# Patient Record
Sex: Male | Born: 1965 | Race: White | Hispanic: No | Marital: Single | Smoking: Current every day smoker
Health system: Southern US, Community
[De-identification: ages and names within clinical notes are randomized; demographics above are authoritative.]

## PROBLEM LIST (undated history)

## (undated) DIAGNOSIS — F329 Major depressive disorder, single episode, unspecified: Secondary | ICD-10-CM

## (undated) DIAGNOSIS — D649 Anemia, unspecified: Secondary | ICD-10-CM

## (undated) DIAGNOSIS — R0600 Dyspnea, unspecified: Secondary | ICD-10-CM

## (undated) DIAGNOSIS — K219 Gastro-esophageal reflux disease without esophagitis: Secondary | ICD-10-CM

## (undated) DIAGNOSIS — M1990 Unspecified osteoarthritis, unspecified site: Secondary | ICD-10-CM

## (undated) DIAGNOSIS — R002 Palpitations: Secondary | ICD-10-CM

## (undated) DIAGNOSIS — R0902 Hypoxemia: Secondary | ICD-10-CM

## (undated) DIAGNOSIS — F32A Depression, unspecified: Secondary | ICD-10-CM

## (undated) DIAGNOSIS — F419 Anxiety disorder, unspecified: Secondary | ICD-10-CM

## (undated) DIAGNOSIS — R0601 Orthopnea: Secondary | ICD-10-CM

## (undated) DIAGNOSIS — I639 Cerebral infarction, unspecified: Secondary | ICD-10-CM

## (undated) DIAGNOSIS — Z8669 Personal history of other diseases of the nervous system and sense organs: Secondary | ICD-10-CM

## (undated) DIAGNOSIS — I209 Angina pectoris, unspecified: Secondary | ICD-10-CM

## (undated) DIAGNOSIS — R609 Edema, unspecified: Secondary | ICD-10-CM

## (undated) DIAGNOSIS — J449 Chronic obstructive pulmonary disease, unspecified: Secondary | ICD-10-CM

## (undated) DIAGNOSIS — R011 Cardiac murmur, unspecified: Secondary | ICD-10-CM

## (undated) DIAGNOSIS — I10 Essential (primary) hypertension: Secondary | ICD-10-CM

## (undated) DIAGNOSIS — I499 Cardiac arrhythmia, unspecified: Secondary | ICD-10-CM

## (undated) DIAGNOSIS — G8929 Other chronic pain: Secondary | ICD-10-CM

## (undated) DIAGNOSIS — J45909 Unspecified asthma, uncomplicated: Secondary | ICD-10-CM

## (undated) DIAGNOSIS — R569 Unspecified convulsions: Secondary | ICD-10-CM

## (undated) DIAGNOSIS — R42 Dizziness and giddiness: Secondary | ICD-10-CM

## (undated) DIAGNOSIS — F1910 Other psychoactive substance abuse, uncomplicated: Secondary | ICD-10-CM

## (undated) DIAGNOSIS — R062 Wheezing: Secondary | ICD-10-CM

## (undated) DIAGNOSIS — K222 Esophageal obstruction: Secondary | ICD-10-CM

## (undated) DIAGNOSIS — F319 Bipolar disorder, unspecified: Secondary | ICD-10-CM

## (undated) DIAGNOSIS — I219 Acute myocardial infarction, unspecified: Secondary | ICD-10-CM

## (undated) DIAGNOSIS — I2510 Atherosclerotic heart disease of native coronary artery without angina pectoris: Secondary | ICD-10-CM

## (undated) HISTORY — PX: PEG TUBE REMOVAL: SHX2187

## (undated) HISTORY — PX: TRACHEOSTOMY CLOSURE: SHX458

## (undated) HISTORY — PX: PEG W/TRACHEOSTOMY PLACEMENT: SHX2188

## (undated) HISTORY — PX: ESOPHAGOGASTRODUODENOSCOPY (EGD) WITH ESOPHAGEAL DILATION: SHX5812

## (undated) HISTORY — PX: APPENDECTOMY: SHX54

---

## 2010-01-03 ENCOUNTER — Ambulatory Visit: Payer: Self-pay

## 2013-05-18 ENCOUNTER — Ambulatory Visit: Payer: Self-pay

## 2013-06-03 ENCOUNTER — Inpatient Hospital Stay: Payer: Self-pay | Admitting: Surgery

## 2013-06-03 LAB — COMPREHENSIVE METABOLIC PANEL
ALT: 24 U/L (ref 12–78)
ANION GAP: 4 — AB (ref 7–16)
Albumin: 2.5 g/dL — ABNORMAL LOW (ref 3.4–5.0)
Alkaline Phosphatase: 59 U/L
BUN: 22 mg/dL — AB (ref 7–18)
Bilirubin,Total: 0.7 mg/dL (ref 0.2–1.0)
CALCIUM: 7.9 mg/dL — AB (ref 8.5–10.1)
CHLORIDE: 92 mmol/L — AB (ref 98–107)
CREATININE: 0.83 mg/dL (ref 0.60–1.30)
Co2: 32 mmol/L (ref 21–32)
EGFR (Non-African Amer.): >60
GLUCOSE: 108 mg/dL — AB (ref 65–99)
Osmolality: 261 (ref 275–301)
POTASSIUM: 3.5 mmol/L (ref 3.5–5.1)
SGOT(AST): 33 U/L (ref 15–37)
SODIUM: 128 mmol/L — AB (ref 136–145)
Total Protein: 7 g/dL (ref 6.4–8.2)

## 2013-06-03 LAB — URINALYSIS, COMPLETE
BLOOD: NEGATIVE
Bacteria: NONE SEEN
Bilirubin,UR: NEGATIVE
GLUCOSE, UR: NEGATIVE mg/dL (ref 0–75)
KETONE: NEGATIVE
LEUKOCYTE ESTERASE: NEGATIVE
NITRITE: NEGATIVE
PROTEIN: NEGATIVE
Ph: 6 (ref 4.5–8.0)
RBC,UR: NONE SEEN /HPF (ref 0–5)
SPECIFIC GRAVITY: 1.017 (ref 1.003–1.030)
Squamous Epithelial: NONE SEEN

## 2013-06-03 LAB — CBC WITH DIFFERENTIAL/PLATELET
BASOS PCT: 0.5 %
Basophil #: 0.1 10*3/uL (ref 0.0–0.1)
Eosinophil #: 0.1 10*3/uL (ref 0.0–0.7)
Eosinophil %: 1 %
HCT: 45.6 % (ref 40.0–52.0)
HGB: 15.4 g/dL (ref 13.0–18.0)
LYMPHS ABS: 1.3 10*3/uL (ref 1.0–3.6)
Lymphocyte %: 10.2 %
MCH: 30.9 pg (ref 26.0–34.0)
MCHC: 33.8 g/dL (ref 32.0–36.0)
MCV: 91 fL (ref 80–100)
MONO ABS: 3.1 x10 3/mm — AB (ref 0.2–1.0)
Monocyte %: 24.6 %
NEUTROS ABS: 8 10*3/uL — AB (ref 1.4–6.5)
NEUTROS PCT: 63.7 %
Platelet: 188 10*3/uL (ref 150–440)
RBC: 4.99 10*6/uL (ref 4.40–5.90)
RDW: 14.9 % — AB (ref 11.5–14.5)
WBC: 12.6 10*3/uL — AB (ref 3.8–10.6)

## 2013-06-03 LAB — PROTIME-INR
INR: 1
Prothrombin Time: 13.1 secs (ref 11.5–14.7)

## 2013-06-03 LAB — PRO B NATRIURETIC PEPTIDE: B-Type Natriuretic Peptide: 25 pg/mL (ref 0–125)

## 2013-06-03 LAB — LIPASE, BLOOD: LIPASE: 286 U/L (ref 73–393)

## 2013-06-04 LAB — BASIC METABOLIC PANEL
ANION GAP: 4 — AB (ref 7–16)
BUN: 15 mg/dL (ref 7–18)
CHLORIDE: 101 mmol/L (ref 98–107)
CO2: 27 mmol/L (ref 21–32)
Calcium, Total: 6.2 mg/dL — CL (ref 8.5–10.1)
Creatinine: 0.85 mg/dL (ref 0.60–1.30)
EGFR (Non-African Amer.): >60
Glucose: 190 mg/dL — ABNORMAL HIGH (ref 65–99)
Osmolality: 270 (ref 275–301)
Potassium: 4 mmol/L (ref 3.5–5.1)
Sodium: 132 mmol/L — ABNORMAL LOW (ref 136–145)

## 2013-06-04 LAB — CBC WITH DIFFERENTIAL/PLATELET
BASOS PCT: 0.2 %
Basophil #: 0 10*3/uL (ref 0.0–0.1)
EOS ABS: 0 10*3/uL (ref 0.0–0.7)
EOS PCT: 0.3 %
HCT: 42.2 % (ref 40.0–52.0)
HGB: 14.2 g/dL (ref 13.0–18.0)
LYMPHS ABS: 0.7 10*3/uL — AB (ref 1.0–3.6)
LYMPHS PCT: 5.1 %
MCH: 31.1 pg (ref 26.0–34.0)
MCHC: 33.7 g/dL (ref 32.0–36.0)
MCV: 92 fL (ref 80–100)
MONO ABS: 1.5 x10 3/mm — AB (ref 0.2–1.0)
Monocyte %: 11.2 %
NEUTROS ABS: 11.5 10*3/uL — AB (ref 1.4–6.5)
NEUTROS PCT: 83.2 %
Platelet: 171 10*3/uL (ref 150–440)
RBC: 4.57 10*6/uL (ref 4.40–5.90)
RDW: 15.3 % — AB (ref 11.5–14.5)
WBC: 13.8 10*3/uL — AB (ref 3.8–10.6)

## 2013-06-05 LAB — BASIC METABOLIC PANEL
Anion Gap: 2 — ABNORMAL LOW (ref 7–16)
Anion Gap: 1 — ABNORMAL LOW (ref 7–16)
BUN: 10 mg/dL (ref 7–18)
BUN: 16 mg/dL (ref 7–18)
CO2: 29 mmol/L (ref 21–32)
CREATININE: 0.78 mg/dL (ref 0.60–1.30)
Calcium, Total: 6.7 mg/dL — CL (ref 8.5–10.1)
Calcium, Total: 7 mg/dL — CL (ref 8.5–10.1)
Chloride: 105 mmol/L (ref 98–107)
Chloride: 107 mmol/L (ref 98–107)
Co2: 31 mmol/L (ref 21–32)
Creatinine: 0.81 mg/dL (ref 0.60–1.30)
EGFR (African American): >60
EGFR (Non-African Amer.): >60
GLUCOSE: 122 mg/dL — AB (ref 65–99)
GLUCOSE: 123 mg/dL — AB (ref 65–99)
Osmolality: 272 (ref 275–301)
Osmolality: 280 (ref 275–301)
POTASSIUM: 3.8 mmol/L (ref 3.5–5.1)
Potassium: 4.3 mmol/L (ref 3.5–5.1)
SODIUM: 136 mmol/L (ref 136–145)
Sodium: 139 mmol/L (ref 136–145)

## 2013-06-05 LAB — CBC WITH DIFFERENTIAL/PLATELET
BANDS NEUTROPHIL: 4 %
Bands: 8 %
Basophil: 2 %
Eosinophil: 1 %
HCT: 38.5 % — AB (ref 40.0–52.0)
HCT: 32.8 % — AB (ref 40.0–52.0)
HGB: 12.6 g/dL — ABNORMAL LOW (ref 13.0–18.0)
HGB: 10.5 g/dL — AB (ref 13.0–18.0)
Lymphocytes: 15 %
Lymphocytes: 16 %
MCH: 30.3 pg (ref 26.0–34.0)
MCH: 29.8 pg (ref 26.0–34.0)
MCHC: 32.8 g/dL (ref 32.0–36.0)
MCHC: 31.9 g/dL — ABNORMAL LOW (ref 32.0–36.0)
MCV: 93 fL (ref 80–100)
MCV: 93 fL (ref 80–100)
MONOS PCT: 14 %
MONOS PCT: 8 %
MYELOCYTE: 1 %
Metamyelocyte: 1 %
Metamyelocyte: 2 %
NRBC/100 WBC: 1 /
PLATELETS: 267 10*3/uL (ref 150–440)
Platelet: 231 10*3/uL (ref 150–440)
RBC: 4.17 10*6/uL — AB (ref 4.40–5.90)
RBC: 3.51 10*6/uL — ABNORMAL LOW (ref 4.40–5.90)
RDW: 15.2 % — ABNORMAL HIGH (ref 11.5–14.5)
RDW: 14.9 % — AB (ref 11.5–14.5)
SEGMENTED NEUTROPHILS: 68 %
Segmented Neutrophils: 60 %
WBC: 18.3 10*3/uL — AB (ref 3.8–10.6)
WBC: 25.9 10*3/uL — AB (ref 3.8–10.6)

## 2013-06-06 LAB — BASIC METABOLIC PANEL
BUN: 20 mg/dL — AB (ref 7–18)
CALCIUM: 6.8 mg/dL — AB (ref 8.5–10.1)
CREATININE: 0.8 mg/dL (ref 0.60–1.30)
Chloride: 108 mmol/L — ABNORMAL HIGH (ref 98–107)
Co2: 36 mmol/L — ABNORMAL HIGH (ref 21–32)
Glucose: 97 mg/dL (ref 65–99)
Osmolality: 288 (ref 275–301)
Potassium: 4 mmol/L (ref 3.5–5.1)
Sodium: 143 mmol/L (ref 136–145)

## 2013-06-06 LAB — CBC WITH DIFFERENTIAL/PLATELET
BANDS NEUTROPHIL: 4 %
Eosinophil: 1 %
HCT: 28.5 % — AB (ref 40.0–52.0)
HGB: 9.3 g/dL — ABNORMAL LOW (ref 13.0–18.0)
Lymphocytes: 9 %
MCH: 30.3 pg (ref 26.0–34.0)
MCHC: 32.5 g/dL (ref 32.0–36.0)
MCV: 93 fL (ref 80–100)
METAMYELOCYTE: 3 %
Monocytes: 7 %
NRBC/100 WBC: 1 /
PLATELETS: 212 10*3/uL (ref 150–440)
RBC: 3.07 10*6/uL — AB (ref 4.40–5.90)
RDW: 15.1 % — AB (ref 11.5–14.5)
Segmented Neutrophils: 76 %
WBC: 16.7 10*3/uL — AB (ref 3.8–10.6)

## 2013-06-06 LAB — PHOSPHORUS: Phosphorus: 1.7 mg/dL — ABNORMAL LOW (ref 2.5–4.9)

## 2013-06-06 LAB — MAGNESIUM: Magnesium: 2.4 mg/dL

## 2013-06-07 LAB — BASIC METABOLIC PANEL
Anion Gap: 1 — ABNORMAL LOW (ref 7–16)
BUN: 16 mg/dL (ref 7–18)
CALCIUM: 6.8 mg/dL — AB (ref 8.5–10.1)
Chloride: 106 mmol/L (ref 98–107)
Co2: 36 mmol/L — ABNORMAL HIGH (ref 21–32)
Creatinine: 0.9 mg/dL (ref 0.60–1.30)
Glucose: 110 mg/dL — ABNORMAL HIGH (ref 65–99)
Osmolality: 287 (ref 275–301)
Potassium: 3.7 mmol/L (ref 3.5–5.1)
SODIUM: 143 mmol/L (ref 136–145)

## 2013-06-07 LAB — MAGNESIUM: Magnesium: 2.4 mg/dL

## 2013-06-07 LAB — CBC WITH DIFFERENTIAL/PLATELET
BASOS ABS: 0.1 10*3/uL (ref 0.0–0.1)
BASOS PCT: 0.4 %
Eosinophil #: 0.2 10*3/uL (ref 0.0–0.7)
Eosinophil %: 1.3 %
HCT: 26 % — AB (ref 40.0–52.0)
HGB: 8.7 g/dL — AB (ref 13.0–18.0)
Lymphocyte #: 1.1 10*3/uL (ref 1.0–3.6)
Lymphocyte %: 8.3 %
MCH: 31.4 pg (ref 26.0–34.0)
MCHC: 33.5 g/dL (ref 32.0–36.0)
MCV: 94 fL (ref 80–100)
Monocyte #: 1.1 x10 3/mm — ABNORMAL HIGH (ref 0.2–1.0)
Monocyte %: 8.2 %
NEUTROS PCT: 81.8 %
Neutrophil #: 10.9 10*3/uL — ABNORMAL HIGH (ref 1.4–6.5)
Platelet: 212 10*3/uL (ref 150–440)
RBC: 2.78 10*6/uL — AB (ref 4.40–5.90)
RDW: 15.3 % — ABNORMAL HIGH (ref 11.5–14.5)
WBC: 13.3 10*3/uL — ABNORMAL HIGH (ref 3.8–10.6)

## 2013-06-08 LAB — ALBUMIN: Albumin: 1.6 g/dL — ABNORMAL LOW (ref 3.4–5.0)

## 2013-06-08 LAB — CALCIUM: CALCIUM: 7 mg/dL — AB (ref 8.5–10.1)

## 2013-06-08 LAB — PATHOLOGY REPORT

## 2013-06-08 LAB — PHOSPHORUS: Phosphorus: 2.9 mg/dL (ref 2.5–4.9)

## 2013-06-09 LAB — BASIC METABOLIC PANEL
ANION GAP: 5 — AB (ref 7–16)
BUN: 12 mg/dL (ref 7–18)
CALCIUM: 6.8 mg/dL — AB (ref 8.5–10.1)
CHLORIDE: 106 mmol/L (ref 98–107)
CO2: 32 mmol/L (ref 21–32)
CREATININE: 0.95 mg/dL (ref 0.60–1.30)
EGFR (African American): >60
GLUCOSE: 101 mg/dL — AB (ref 65–99)
Osmolality: 285 (ref 275–301)
POTASSIUM: 3.6 mmol/L (ref 3.5–5.1)
Sodium: 143 mmol/L (ref 136–145)

## 2013-06-09 LAB — CBC WITH DIFFERENTIAL/PLATELET
Basophil #: 0.1 10*3/uL (ref 0.0–0.1)
Basophil %: 0.7 %
EOS PCT: 1.9 %
Eosinophil #: 0.2 10*3/uL (ref 0.0–0.7)
HCT: 24.7 % — ABNORMAL LOW (ref 40.0–52.0)
HGB: 8.2 g/dL — ABNORMAL LOW (ref 13.0–18.0)
LYMPHS PCT: 7.6 %
Lymphocyte #: 1 10*3/uL (ref 1.0–3.6)
MCH: 31.1 pg (ref 26.0–34.0)
MCHC: 33.2 g/dL (ref 32.0–36.0)
MCV: 94 fL (ref 80–100)
MONO ABS: 1 x10 3/mm (ref 0.2–1.0)
Monocyte %: 7.4 %
Neutrophil #: 10.7 10*3/uL — ABNORMAL HIGH (ref 1.4–6.5)
Neutrophil %: 82.4 %
Platelet: 266 10*3/uL (ref 150–440)
RBC: 2.64 10*6/uL — AB (ref 4.40–5.90)
RDW: 14.9 % — AB (ref 11.5–14.5)
WBC: 13 10*3/uL — ABNORMAL HIGH (ref 3.8–10.6)

## 2013-06-09 LAB — TRIGLYCERIDES: Triglycerides: 532 mg/dL — ABNORMAL HIGH (ref 0–200)

## 2013-06-09 LAB — PROTIME-INR
INR: 1.1
Prothrombin Time: 14 secs (ref 11.5–14.7)

## 2013-06-11 LAB — CBC WITH DIFFERENTIAL/PLATELET
BASOS ABS: 0.1 10*3/uL (ref 0.0–0.1)
BASOS PCT: 0.7 %
EOS PCT: 1.1 %
Eosinophil #: 0.1 10*3/uL (ref 0.0–0.7)
HCT: 22.7 % — ABNORMAL LOW (ref 40.0–52.0)
HGB: 7.3 g/dL — ABNORMAL LOW (ref 13.0–18.0)
LYMPHS PCT: 12.3 %
Lymphocyte #: 1.2 10*3/uL (ref 1.0–3.6)
MCH: 30.6 pg (ref 26.0–34.0)
MCHC: 32.2 g/dL (ref 32.0–36.0)
MCV: 95 fL (ref 80–100)
MONO ABS: 0.7 x10 3/mm (ref 0.2–1.0)
Monocyte %: 7.7 %
Neutrophil #: 7.5 10*3/uL — ABNORMAL HIGH (ref 1.4–6.5)
Neutrophil %: 78.2 %
Platelet: 284 10*3/uL (ref 150–440)
RBC: 2.38 10*6/uL — AB (ref 4.40–5.90)
RDW: 15.7 % — ABNORMAL HIGH (ref 11.5–14.5)
WBC: 9.6 10*3/uL (ref 3.8–10.6)

## 2013-06-11 LAB — BASIC METABOLIC PANEL
Anion Gap: 1 — ABNORMAL LOW (ref 7–16)
BUN: 12 mg/dL (ref 7–18)
CHLORIDE: 110 mmol/L — AB (ref 98–107)
CO2: 33 mmol/L — AB (ref 21–32)
Calcium, Total: 7.2 mg/dL — ABNORMAL LOW (ref 8.5–10.1)
Creatinine: 0.93 mg/dL (ref 0.60–1.30)
EGFR (Non-African Amer.): >60
GLUCOSE: 88 mg/dL (ref 65–99)
Osmolality: 286 (ref 275–301)
POTASSIUM: 4 mmol/L (ref 3.5–5.1)
Sodium: 144 mmol/L (ref 136–145)

## 2013-06-11 LAB — MAGNESIUM: MAGNESIUM: 2 mg/dL

## 2013-06-11 LAB — PHOSPHORUS: Phosphorus: 2.5 mg/dL (ref 2.5–4.9)

## 2013-06-12 LAB — TPN PANEL
ALK PHOS: 52 U/L
AST: 24 U/L (ref 15–37)
Activated PTT: 31.6 secs (ref 23.6–35.9)
Albumin: 1.4 g/dL — ABNORMAL LOW (ref 3.4–5.0)
BUN: 12 mg/dL (ref 7–18)
CALCIUM: 7.3 mg/dL — AB (ref 8.5–10.1)
CHOLESTEROL: 82 mg/dL (ref 0–200)
CO2: 36 mmol/L — AB (ref 21–32)
Chloride: 110 mmol/L — ABNORMAL HIGH (ref 98–107)
Creatinine: 0.8 mg/dL (ref 0.60–1.30)
EGFR (African American): >60
GLUCOSE: 158 mg/dL — AB (ref 65–99)
HGB: 7.3 g/dL — ABNORMAL LOW (ref 13.0–18.0)
INR: 1.1
MAGNESIUM: 2 mg/dL
OSMOLALITY: 292 (ref 275–301)
PLATELETS: 272 10*3/uL (ref 150–440)
POTASSIUM: 3.8 mmol/L (ref 3.5–5.1)
Phosphorus: 2.4 mg/dL — ABNORMAL LOW (ref 2.5–4.9)
Prothrombin Time: 14.2 secs (ref 11.5–14.7)
Sodium: 145 mmol/L (ref 136–145)
Total Protein: 5.5 g/dL — ABNORMAL LOW (ref 6.4–8.2)
Triglycerides: 218 mg/dL — ABNORMAL HIGH (ref 0–200)
WBC: 8.4 10*3/uL (ref 3.8–10.6)

## 2013-06-13 LAB — CBC WITH DIFFERENTIAL/PLATELET
BASOS PCT: 0.6 %
Basophil #: 0 10*3/uL (ref 0.0–0.1)
Eosinophil #: 0.1 10*3/uL (ref 0.0–0.7)
Eosinophil %: 1.5 %
HCT: 23.4 % — ABNORMAL LOW (ref 40.0–52.0)
HGB: 7.5 g/dL — AB (ref 13.0–18.0)
LYMPHS PCT: 11.4 %
Lymphocyte #: 0.9 10*3/uL — ABNORMAL LOW (ref 1.0–3.6)
MCH: 30.5 pg (ref 26.0–34.0)
MCHC: 32 g/dL (ref 32.0–36.0)
MCV: 95 fL (ref 80–100)
Monocyte #: 0.5 x10 3/mm (ref 0.2–1.0)
Monocyte %: 6.9 %
Neutrophil #: 6 10*3/uL (ref 1.4–6.5)
Neutrophil %: 79.6 %
Platelet: 303 10*3/uL (ref 150–440)
RBC: 2.46 10*6/uL — ABNORMAL LOW (ref 4.40–5.90)
RDW: 15.7 % — ABNORMAL HIGH (ref 11.5–14.5)
WBC: 7.5 10*3/uL (ref 3.8–10.6)

## 2013-06-13 LAB — PHOSPHORUS: Phosphorus: 3.1 mg/dL (ref 2.5–4.9)

## 2013-06-13 LAB — BASIC METABOLIC PANEL
Anion Gap: 0 — ABNORMAL LOW (ref 7–16)
BUN: 11 mg/dL (ref 7–18)
Chloride: 107 mmol/L (ref 98–107)
Co2: 38 mmol/L — ABNORMAL HIGH (ref 21–32)
Creatinine: 0.85 mg/dL (ref 0.60–1.30)
Glucose: 149 mg/dL — ABNORMAL HIGH (ref 65–99)
Osmolality: 291 (ref 275–301)

## 2013-06-13 LAB — POTASSIUM
POTASSIUM: 3.7 mmol/L (ref 3.5–5.1)
Potassium: 3.4 mmol/L — ABNORMAL LOW (ref 3.5–5.1)

## 2013-06-13 LAB — SODIUM: Sodium: 145 mmol/L (ref 136–145)

## 2013-06-13 LAB — URINE CULTURE

## 2013-06-13 LAB — MAGNESIUM: Magnesium: 1.9 mg/dL

## 2013-06-13 LAB — CALCIUM: Calcium, Total: 7.6 mg/dL — ABNORMAL LOW (ref 8.5–10.1)

## 2013-06-14 LAB — BASIC METABOLIC PANEL
BUN: 11 mg/dL (ref 7–18)
CALCIUM: 7.6 mg/dL — AB (ref 8.5–10.1)
CHLORIDE: 108 mmol/L — AB (ref 98–107)
Co2: 39 mmol/L — ABNORMAL HIGH (ref 21–32)
Creatinine: 0.8 mg/dL (ref 0.60–1.30)
GLUCOSE: 157 mg/dL — AB (ref 65–99)
Osmolality: 291 (ref 275–301)
POTASSIUM: 3.5 mmol/L (ref 3.5–5.1)
Sodium: 145 mmol/L (ref 136–145)

## 2013-06-14 LAB — PHOSPHORUS: Phosphorus: 3.3 mg/dL (ref 2.5–4.9)

## 2013-06-14 LAB — MAGNESIUM: Magnesium: 1.9 mg/dL

## 2013-06-15 LAB — PHOSPHORUS
PHOSPHORUS: 1.8 mg/dL — AB (ref 2.5–4.9)
Phosphorus: 2 mg/dL — ABNORMAL LOW (ref 2.5–4.9)

## 2013-06-15 LAB — SODIUM: Sodium: 144 mmol/L (ref 136–145)

## 2013-06-15 LAB — BASIC METABOLIC PANEL
BUN: 17 mg/dL (ref 7–18)
CHLORIDE: 108 mmol/L — AB (ref 98–107)
CREATININE: 0.77 mg/dL (ref 0.60–1.30)
Calcium, Total: 7.8 mg/dL — ABNORMAL LOW (ref 8.5–10.1)
Co2: 37 mmol/L — ABNORMAL HIGH (ref 21–32)
EGFR (Non-African Amer.): >60
GLUCOSE: 209 mg/dL — AB (ref 65–99)
OSMOLALITY: 295 (ref 275–301)
POTASSIUM: 4.1 mmol/L (ref 3.5–5.1)
Sodium: 144 mmol/L (ref 136–145)

## 2013-06-15 LAB — POTASSIUM: Potassium: 3.6 mmol/L (ref 3.5–5.1)

## 2013-06-15 LAB — EXPECTORATED SPUTUM ASSESSMENT W GRAM STAIN, RFLX TO RESP C

## 2013-06-15 LAB — MAGNESIUM: MAGNESIUM: 1.9 mg/dL

## 2013-06-16 LAB — BASIC METABOLIC PANEL
ANION GAP: 3 — AB (ref 7–16)
BUN: 18 mg/dL (ref 7–18)
CHLORIDE: 105 mmol/L (ref 98–107)
Calcium, Total: 7.7 mg/dL — ABNORMAL LOW (ref 8.5–10.1)
Co2: 35 mmol/L — ABNORMAL HIGH (ref 21–32)
Creatinine: 0.81 mg/dL (ref 0.60–1.30)
EGFR (Non-African Amer.): >60
Glucose: 203 mg/dL — ABNORMAL HIGH (ref 65–99)
Osmolality: 293 (ref 275–301)
POTASSIUM: 3.7 mmol/L (ref 3.5–5.1)
Sodium: 143 mmol/L (ref 136–145)

## 2013-06-16 LAB — PHOSPHORUS: PHOSPHORUS: 3.5 mg/dL (ref 2.5–4.9)

## 2013-06-16 LAB — MAGNESIUM: Magnesium: 2 mg/dL

## 2013-06-17 LAB — BASIC METABOLIC PANEL
Anion Gap: 8 (ref 7–16)
Anion Gap: 4 — ABNORMAL LOW (ref 7–16)
BUN: 13 mg/dL (ref 7–18)
BUN: 15 mg/dL (ref 7–18)
CALCIUM: 7.4 mg/dL — AB (ref 8.5–10.1)
CHLORIDE: 108 mmol/L — AB (ref 98–107)
CO2: 34 mmol/L — AB (ref 21–32)
CREATININE: 0.72 mg/dL (ref 0.60–1.30)
Calcium, Total: 8 mg/dL — ABNORMAL LOW (ref 8.5–10.1)
Chloride: 101 mmol/L (ref 98–107)
Co2: 27 mmol/L (ref 21–32)
Creatinine: 1.06 mg/dL (ref 0.60–1.30)
EGFR (African American): >60
EGFR (Non-African Amer.): >60
EGFR (Non-African Amer.): >60
GLUCOSE: 1719 mg/dL — AB (ref 65–99)
Glucose: 122 mg/dL — ABNORMAL HIGH (ref 65–99)
OSMOLALITY: 362 (ref 275–301)
Osmolality: 293 (ref 275–301)
POTASSIUM: 3.7 mmol/L (ref 3.5–5.1)
Potassium: 5.3 mmol/L — ABNORMAL HIGH (ref 3.5–5.1)
SODIUM: 136 mmol/L (ref 136–145)
Sodium: 146 mmol/L — ABNORMAL HIGH (ref 136–145)

## 2013-06-17 LAB — PHOSPHORUS: PHOSPHORUS: 6.4 mg/dL — AB (ref 2.5–4.9)

## 2013-06-17 LAB — CULTURE, BLOOD (SINGLE)

## 2013-06-17 LAB — MAGNESIUM: Magnesium: 2.2 mg/dL

## 2013-06-18 LAB — CBC WITH DIFFERENTIAL/PLATELET
Basophil #: 0 10*3/uL (ref 0.0–0.1)
Basophil %: 0.1 %
EOS ABS: 0 10*3/uL (ref 0.0–0.7)
Eosinophil %: 0 %
HCT: 26.2 % — AB (ref 40.0–52.0)
HGB: 8.3 g/dL — ABNORMAL LOW (ref 13.0–18.0)
LYMPHS ABS: 1.4 10*3/uL (ref 1.0–3.6)
Lymphocyte %: 10.4 %
MCH: 30.5 pg (ref 26.0–34.0)
MCHC: 31.6 g/dL — ABNORMAL LOW (ref 32.0–36.0)
MCV: 97 fL (ref 80–100)
Monocyte #: 0.7 x10 3/mm (ref 0.2–1.0)
Monocyte %: 5.5 %
NEUTROS ABS: 11.1 10*3/uL — AB (ref 1.4–6.5)
Neutrophil %: 84 %
Platelet: 387 10*3/uL (ref 150–440)
RBC: 2.71 10*6/uL — AB (ref 4.40–5.90)
RDW: 16.5 % — ABNORMAL HIGH (ref 11.5–14.5)
WBC: 13.3 10*3/uL — ABNORMAL HIGH (ref 3.8–10.6)

## 2013-06-18 LAB — BASIC METABOLIC PANEL
ANION GAP: 5 — AB (ref 7–16)
BUN: 20 mg/dL — AB (ref 7–18)
CALCIUM: 7.7 mg/dL — AB (ref 8.5–10.1)
CO2: 31 mmol/L (ref 21–32)
Chloride: 107 mmol/L (ref 98–107)
Creatinine: 0.81 mg/dL (ref 0.60–1.30)
EGFR (African American): >60
EGFR (Non-African Amer.): >60
Glucose: 179 mg/dL — ABNORMAL HIGH (ref 65–99)
Osmolality: 292 (ref 275–301)
Potassium: 4.1 mmol/L (ref 3.5–5.1)
Sodium: 143 mmol/L (ref 136–145)

## 2013-06-18 LAB — PHOSPHORUS: PHOSPHORUS: 4.4 mg/dL (ref 2.5–4.9)

## 2013-06-18 LAB — MAGNESIUM: Magnesium: 1.9 mg/dL

## 2013-06-19 LAB — BASIC METABOLIC PANEL
Anion Gap: 1 — ABNORMAL LOW (ref 7–16)
BUN: 20 mg/dL — ABNORMAL HIGH (ref 7–18)
CALCIUM: 7.3 mg/dL — AB (ref 8.5–10.1)
Chloride: 107 mmol/L (ref 98–107)
Co2: 35 mmol/L — ABNORMAL HIGH (ref 21–32)
Creatinine: 0.76 mg/dL (ref 0.60–1.30)
EGFR (African American): >60
EGFR (Non-African Amer.): >60
GLUCOSE: 162 mg/dL — AB (ref 65–99)
Osmolality: 291 (ref 275–301)
Potassium: 4.2 mmol/L (ref 3.5–5.1)
Sodium: 143 mmol/L (ref 136–145)

## 2013-06-19 LAB — CBC WITH DIFFERENTIAL/PLATELET
Bands: 4 %
HCT: 23.9 % — ABNORMAL LOW (ref 40.0–52.0)
HGB: 7.6 g/dL — AB (ref 13.0–18.0)
Lymphocytes: 11 %
MCH: 31.1 pg (ref 26.0–34.0)
MCHC: 31.9 g/dL — AB (ref 32.0–36.0)
MCV: 98 fL (ref 80–100)
Metamyelocyte: 1 %
Monocytes: 3 %
NRBC/100 WBC: 1 /
PLATELETS: 300 10*3/uL (ref 150–440)
RBC: 2.45 10*6/uL — ABNORMAL LOW (ref 4.40–5.90)
RDW: 16.7 % — AB (ref 11.5–14.5)
Segmented Neutrophils: 81 %
WBC: 8.1 10*3/uL (ref 3.8–10.6)

## 2013-06-19 LAB — PHOSPHORUS: Phosphorus: 4 mg/dL (ref 2.5–4.9)

## 2013-06-19 LAB — MAGNESIUM: MAGNESIUM: 2 mg/dL

## 2013-06-20 LAB — BASIC METABOLIC PANEL
Anion Gap: 0 — ABNORMAL LOW (ref 7–16)
BUN: 22 mg/dL — ABNORMAL HIGH (ref 7–18)
CALCIUM: 7.2 mg/dL — AB (ref 8.5–10.1)
Chloride: 108 mmol/L — ABNORMAL HIGH (ref 98–107)
Co2: 35 mmol/L — ABNORMAL HIGH (ref 21–32)
Creatinine: 0.8 mg/dL (ref 0.60–1.30)
GLUCOSE: 197 mg/dL — AB (ref 65–99)
Osmolality: 294 (ref 275–301)
Potassium: 4.3 mmol/L (ref 3.5–5.1)
SODIUM: 143 mmol/L (ref 136–145)

## 2013-06-20 LAB — MAGNESIUM: Magnesium: 1.9 mg/dL

## 2013-06-20 LAB — TRIGLYCERIDES: TRIGLYCERIDES: 269 mg/dL — AB (ref 0–200)

## 2013-06-20 LAB — PHOSPHORUS: PHOSPHORUS: 3.7 mg/dL (ref 2.5–4.9)

## 2013-06-21 LAB — BASIC METABOLIC PANEL
ANION GAP: 0 — AB (ref 7–16)
BUN: 21 mg/dL — ABNORMAL HIGH (ref 7–18)
Calcium, Total: 7.6 mg/dL — ABNORMAL LOW (ref 8.5–10.1)
Chloride: 106 mmol/L (ref 98–107)
Co2: 35 mmol/L — ABNORMAL HIGH (ref 21–32)
Creatinine: 0.75 mg/dL (ref 0.60–1.30)
EGFR (Non-African Amer.): >60
Glucose: 167 mg/dL — ABNORMAL HIGH (ref 65–99)
Osmolality: 288 (ref 275–301)
POTASSIUM: 4.4 mmol/L (ref 3.5–5.1)
Sodium: 141 mmol/L (ref 136–145)

## 2013-06-21 LAB — PHOSPHORUS: Phosphorus: 3.9 mg/dL (ref 2.5–4.9)

## 2013-06-21 LAB — ALBUMIN: Albumin: 1.9 g/dL — ABNORMAL LOW (ref 3.4–5.0)

## 2013-06-21 LAB — MAGNESIUM: Magnesium: 2.1 mg/dL

## 2013-06-22 LAB — BASIC METABOLIC PANEL
ANION GAP: 0 — AB (ref 7–16)
BUN: 20 mg/dL — AB (ref 7–18)
Calcium, Total: 7.1 mg/dL — ABNORMAL LOW (ref 8.5–10.1)
Chloride: 105 mmol/L (ref 98–107)
Co2: 37 mmol/L — ABNORMAL HIGH (ref 21–32)
Creatinine: 0.69 mg/dL (ref 0.60–1.30)
EGFR (African American): >60
Glucose: 124 mg/dL — ABNORMAL HIGH (ref 65–99)
Osmolality: 287 (ref 275–301)
POTASSIUM: 3.6 mmol/L (ref 3.5–5.1)
Sodium: 142 mmol/L (ref 136–145)

## 2013-06-22 LAB — MAGNESIUM: Magnesium: 1.8 mg/dL

## 2013-06-22 LAB — PHOSPHORUS: PHOSPHORUS: 3.1 mg/dL (ref 2.5–4.9)

## 2013-06-23 LAB — BASIC METABOLIC PANEL
Anion Gap: 1 — ABNORMAL LOW (ref 7–16)
BUN: 17 mg/dL (ref 7–18)
CHLORIDE: 105 mmol/L (ref 98–107)
Calcium, Total: 7.3 mg/dL — ABNORMAL LOW (ref 8.5–10.1)
Co2: 35 mmol/L — ABNORMAL HIGH (ref 21–32)
Creatinine: 0.64 mg/dL (ref 0.60–1.30)
EGFR (African American): >60
EGFR (Non-African Amer.): >60
Glucose: 152 mg/dL — ABNORMAL HIGH (ref 65–99)
Osmolality: 286 (ref 275–301)
POTASSIUM: 3.8 mmol/L (ref 3.5–5.1)
Sodium: 141 mmol/L (ref 136–145)

## 2013-06-23 LAB — PHOSPHORUS: Phosphorus: 3.5 mg/dL (ref 2.5–4.9)

## 2013-06-23 LAB — MAGNESIUM: Magnesium: 2 mg/dL

## 2013-06-24 LAB — CBC WITH DIFFERENTIAL/PLATELET
BASOS ABS: 0 10*3/uL (ref 0.0–0.1)
BASOS PCT: 0.8 %
EOS ABS: 0.1 10*3/uL (ref 0.0–0.7)
Eosinophil %: 1.3 %
HCT: 27.7 % — ABNORMAL LOW (ref 40.0–52.0)
HGB: 8.6 g/dL — AB (ref 13.0–18.0)
LYMPHS PCT: 9.2 %
Lymphocyte #: 0.5 10*3/uL — ABNORMAL LOW (ref 1.0–3.6)
MCH: 30.7 pg (ref 26.0–34.0)
MCHC: 30.9 g/dL — ABNORMAL LOW (ref 32.0–36.0)
MCV: 100 fL (ref 80–100)
Monocyte #: 0.4 x10 3/mm (ref 0.2–1.0)
Monocyte %: 7 %
NEUTROS PCT: 81.7 %
Neutrophil #: 4.5 10*3/uL (ref 1.4–6.5)
Platelet: 149 10*3/uL — ABNORMAL LOW (ref 150–440)
RBC: 2.79 10*6/uL — AB (ref 4.40–5.90)
RDW: 21.6 % — AB (ref 11.5–14.5)
WBC: 5.5 10*3/uL (ref 3.8–10.6)

## 2013-06-24 LAB — TRIGLYCERIDES: Triglycerides: 141 mg/dL (ref 0–200)

## 2013-06-24 LAB — BASIC METABOLIC PANEL
Anion Gap: 3 — ABNORMAL LOW (ref 7–16)
BUN: 17 mg/dL (ref 7–18)
CALCIUM: 7.5 mg/dL — AB (ref 8.5–10.1)
CHLORIDE: 104 mmol/L (ref 98–107)
CO2: 34 mmol/L — AB (ref 21–32)
CREATININE: 0.71 mg/dL (ref 0.60–1.30)
EGFR (African American): >60
Glucose: 111 mg/dL — ABNORMAL HIGH (ref 65–99)
OSMOLALITY: 283 (ref 275–301)
POTASSIUM: 3.7 mmol/L (ref 3.5–5.1)
SODIUM: 141 mmol/L (ref 136–145)

## 2013-06-24 LAB — URINALYSIS, COMPLETE
Bacteria: NONE SEEN
Bilirubin,UR: NEGATIVE
Ketone: NEGATIVE
Leukocyte Esterase: NEGATIVE
NITRITE: NEGATIVE
PH: 6 (ref 4.5–8.0)
Protein: NEGATIVE
Specific Gravity: 1.009 (ref 1.003–1.030)
Squamous Epithelial: NONE SEEN

## 2013-06-24 LAB — MAGNESIUM: Magnesium: 1.7 mg/dL — ABNORMAL LOW

## 2013-06-24 LAB — PHOSPHORUS: Phosphorus: 2.9 mg/dL (ref 2.5–4.9)

## 2013-06-25 LAB — CBC WITH DIFFERENTIAL/PLATELET
Bands: 20 %
Basophil: 1 %
Eosinophil: 2 %
HCT: 25.4 % — ABNORMAL LOW (ref 40.0–52.0)
HGB: 8.1 g/dL — ABNORMAL LOW (ref 13.0–18.0)
Lymphocytes: 4 %
MCH: 31.6 pg (ref 26.0–34.0)
MCHC: 32 g/dL (ref 32.0–36.0)
MCV: 99 fL (ref 80–100)
Monocytes: 6 %
NRBC/100 WBC: 1 /
Platelet: 129 10*3/uL — ABNORMAL LOW (ref 150–440)
RBC: 2.57 10*6/uL — ABNORMAL LOW (ref 4.40–5.90)
RDW: 19.9 % — ABNORMAL HIGH (ref 11.5–14.5)
Segmented Neutrophils: 67 %
WBC: 7.1 10*3/uL (ref 3.8–10.6)

## 2013-06-25 LAB — BASIC METABOLIC PANEL
ANION GAP: 3 — AB (ref 7–16)
BUN: 15 mg/dL (ref 7–18)
CO2: 34 mmol/L — AB (ref 21–32)
CREATININE: 0.7 mg/dL (ref 0.60–1.30)
Calcium, Total: 7.2 mg/dL — ABNORMAL LOW (ref 8.5–10.1)
Chloride: 104 mmol/L (ref 98–107)
EGFR (Non-African Amer.): >60
GLUCOSE: 137 mg/dL — AB (ref 65–99)
Osmolality: 284 (ref 275–301)
Potassium: 3.8 mmol/L (ref 3.5–5.1)
Sodium: 141 mmol/L (ref 136–145)

## 2013-06-25 LAB — LIPASE, BLOOD: Lipase: 296 U/L (ref 73–393)

## 2013-06-25 LAB — PHOSPHORUS: PHOSPHORUS: 3.6 mg/dL (ref 2.5–4.9)

## 2013-06-25 LAB — VANCOMYCIN, TROUGH: Vancomycin, Trough: 11 ug/mL (ref 10–20)

## 2013-06-25 LAB — MAGNESIUM: Magnesium: 1.8 mg/dL

## 2013-06-26 LAB — VANCOMYCIN, TROUGH: VANCOMYCIN, TROUGH: 32 ug/mL — AB (ref 10–20)

## 2013-06-27 LAB — CBC WITH DIFFERENTIAL/PLATELET
Basophil #: 0.1 10*3/uL (ref 0.0–0.1)
Basophil %: 0.8 %
Eosinophil #: 0.2 10*3/uL (ref 0.0–0.7)
Eosinophil %: 1.5 %
HCT: 25.5 % — ABNORMAL LOW (ref 40.0–52.0)
HGB: 8.2 g/dL — AB (ref 13.0–18.0)
LYMPHS ABS: 0.6 10*3/uL — AB (ref 1.0–3.6)
LYMPHS PCT: 5.3 %
MCH: 31.6 pg (ref 26.0–34.0)
MCHC: 32.3 g/dL (ref 32.0–36.0)
MCV: 98 fL (ref 80–100)
Monocyte #: 0.7 x10 3/mm (ref 0.2–1.0)
Monocyte %: 6.5 %
NEUTROS PCT: 85.9 %
Neutrophil #: 9.5 10*3/uL — ABNORMAL HIGH (ref 1.4–6.5)
Platelet: 113 10*3/uL — ABNORMAL LOW (ref 150–440)
RBC: 2.6 10*6/uL — AB (ref 4.40–5.90)
RDW: 19.9 % — AB (ref 11.5–14.5)
WBC: 11 10*3/uL — ABNORMAL HIGH (ref 3.8–10.6)

## 2013-06-27 LAB — VANCOMYCIN, TROUGH: VANCOMYCIN, TROUGH: 16 ug/mL (ref 10–20)

## 2013-06-27 LAB — BASIC METABOLIC PANEL
Anion Gap: 5 — ABNORMAL LOW (ref 7–16)
BUN: 12 mg/dL (ref 7–18)
CALCIUM: 7.6 mg/dL — AB (ref 8.5–10.1)
CREATININE: 0.91 mg/dL (ref 0.60–1.30)
Chloride: 104 mmol/L (ref 98–107)
Co2: 32 mmol/L (ref 21–32)
GLUCOSE: 93 mg/dL (ref 65–99)
OSMOLALITY: 281 (ref 275–301)
Potassium: 3.1 mmol/L — ABNORMAL LOW (ref 3.5–5.1)
Sodium: 141 mmol/L (ref 136–145)

## 2013-06-27 LAB — POTASSIUM: POTASSIUM: 3.1 mmol/L — AB (ref 3.5–5.1)

## 2013-06-28 LAB — BASIC METABOLIC PANEL
Anion Gap: 6 — ABNORMAL LOW (ref 7–16)
BUN: 22 mg/dL — AB (ref 7–18)
CALCIUM: 8.2 mg/dL — AB (ref 8.5–10.1)
CHLORIDE: 106 mmol/L (ref 98–107)
CREATININE: 1.45 mg/dL — AB (ref 0.60–1.30)
Co2: 32 mmol/L (ref 21–32)
EGFR (African American): >60
EGFR (Non-African Amer.): 56 — ABNORMAL LOW
Glucose: 120 mg/dL — ABNORMAL HIGH (ref 65–99)
OSMOLALITY: 291 (ref 275–301)
Potassium: 3.3 mmol/L — ABNORMAL LOW (ref 3.5–5.1)
Sodium: 144 mmol/L (ref 136–145)

## 2013-06-28 LAB — URINE CULTURE

## 2013-06-28 LAB — POTASSIUM: Potassium: 3.7 mmol/L (ref 3.5–5.1)

## 2013-06-29 DIAGNOSIS — I059 Rheumatic mitral valve disease, unspecified: Secondary | ICD-10-CM

## 2013-06-29 LAB — CBC WITH DIFFERENTIAL/PLATELET
Basophil #: 0 10*3/uL (ref 0.0–0.1)
Basophil %: 0.4 %
Eosinophil #: 0.1 10*3/uL (ref 0.0–0.7)
Eosinophil %: 1.3 %
HCT: 22.6 % — AB (ref 40.0–52.0)
HGB: 6.9 g/dL — ABNORMAL LOW (ref 13.0–18.0)
Lymphocyte #: 0.8 10*3/uL — ABNORMAL LOW (ref 1.0–3.6)
Lymphocyte %: 8.2 %
MCH: 29.9 pg (ref 26.0–34.0)
MCHC: 30.7 g/dL — AB (ref 32.0–36.0)
MCV: 97 fL (ref 80–100)
Monocyte #: 0.6 x10 3/mm (ref 0.2–1.0)
Monocyte %: 6.2 %
Neutrophil #: 8.4 10*3/uL — ABNORMAL HIGH (ref 1.4–6.5)
Neutrophil %: 83.9 %
PLATELETS: 102 10*3/uL — AB (ref 150–440)
RBC: 2.32 10*6/uL — AB (ref 4.40–5.90)
RDW: 20.8 % — ABNORMAL HIGH (ref 11.5–14.5)
WBC: 10 10*3/uL (ref 3.8–10.6)

## 2013-06-29 LAB — BASIC METABOLIC PANEL
Anion Gap: 4 — ABNORMAL LOW (ref 7–16)
BUN: 40 mg/dL — AB (ref 7–18)
CO2: 31 mmol/L (ref 21–32)
CREATININE: 2.32 mg/dL — AB (ref 0.60–1.30)
Calcium, Total: 7.9 mg/dL — ABNORMAL LOW (ref 8.5–10.1)
Chloride: 108 mmol/L — ABNORMAL HIGH (ref 98–107)
EGFR (African American): 37 — ABNORMAL LOW
EGFR (Non-African Amer.): 32 — ABNORMAL LOW
GLUCOSE: 147 mg/dL — AB (ref 65–99)
Osmolality: 297 (ref 275–301)
Potassium: 3.1 mmol/L — ABNORMAL LOW (ref 3.5–5.1)
Sodium: 143 mmol/L (ref 136–145)

## 2013-06-29 LAB — CULTURE, BLOOD (SINGLE)

## 2013-06-29 LAB — POTASSIUM: Potassium: 3.3 mmol/L — ABNORMAL LOW (ref 3.5–5.1)

## 2013-06-30 LAB — COMPREHENSIVE METABOLIC PANEL
ALBUMIN: 1.3 g/dL — AB (ref 3.4–5.0)
ANION GAP: 4 — AB (ref 7–16)
Alkaline Phosphatase: 381 U/L — ABNORMAL HIGH
BILIRUBIN TOTAL: 2.4 mg/dL — AB (ref 0.2–1.0)
BUN: 53 mg/dL — ABNORMAL HIGH (ref 7–18)
CHLORIDE: 113 mmol/L — AB (ref 98–107)
CO2: 29 mmol/L (ref 21–32)
Calcium, Total: 7.6 mg/dL — ABNORMAL LOW (ref 8.5–10.1)
Creatinine: 3.04 mg/dL — ABNORMAL HIGH (ref 0.60–1.30)
EGFR (Non-African Amer.): 23 — ABNORMAL LOW
GFR CALC AF AMER: 26 — AB
Glucose: 163 mg/dL — ABNORMAL HIGH (ref 65–99)
Osmolality: 309 (ref 275–301)
POTASSIUM: 3.7 mmol/L (ref 3.5–5.1)
SGOT(AST): 45 U/L — ABNORMAL HIGH (ref 15–37)
SGPT (ALT): 39 U/L (ref 12–78)
Sodium: 146 mmol/L — ABNORMAL HIGH (ref 136–145)
Total Protein: 5.9 g/dL — ABNORMAL LOW (ref 6.4–8.2)

## 2013-06-30 LAB — HEMOGLOBIN: HGB: 7.5 g/dL — ABNORMAL LOW (ref 13.0–18.0)

## 2013-07-01 LAB — CBC WITH DIFFERENTIAL/PLATELET
Basophil #: 0 10*3/uL (ref 0.0–0.1)
Basophil %: 0.3 %
EOS ABS: 0.1 10*3/uL (ref 0.0–0.7)
Eosinophil %: 1.9 %
HCT: 20.9 % — AB (ref 40.0–52.0)
HGB: 6.8 g/dL — AB (ref 13.0–18.0)
LYMPHS PCT: 17.7 %
Lymphocyte #: 1.2 10*3/uL (ref 1.0–3.6)
MCH: 31.8 pg (ref 26.0–34.0)
MCHC: 32.6 g/dL (ref 32.0–36.0)
MCV: 98 fL (ref 80–100)
MONOS PCT: 8.3 %
Monocyte #: 0.5 x10 3/mm (ref 0.2–1.0)
NEUTROS ABS: 4.7 10*3/uL (ref 1.4–6.5)
Neutrophil %: 71.8 %
Platelet: 95 10*3/uL — ABNORMAL LOW (ref 150–440)
RBC: 2.14 10*6/uL — ABNORMAL LOW (ref 4.40–5.90)
RDW: 19.1 % — ABNORMAL HIGH (ref 11.5–14.5)
WBC: 6.5 10*3/uL (ref 3.8–10.6)

## 2013-07-01 LAB — HEPATIC FUNCTION PANEL A (ARMC)
ALBUMIN: 1.2 g/dL — AB (ref 3.4–5.0)
ALT: 29 U/L (ref 12–78)
AST: 30 U/L (ref 15–37)
Alkaline Phosphatase: 324 U/L — ABNORMAL HIGH
BILIRUBIN TOTAL: 1.8 mg/dL — AB (ref 0.2–1.0)
Bilirubin, Direct: 1.5 mg/dL — ABNORMAL HIGH (ref 0.00–0.20)
Total Protein: 5.5 g/dL — ABNORMAL LOW (ref 6.4–8.2)

## 2013-07-01 LAB — BASIC METABOLIC PANEL
Anion Gap: 7 (ref 7–16)
BUN: 59 mg/dL — AB (ref 7–18)
CALCIUM: 7.5 mg/dL — AB (ref 8.5–10.1)
Chloride: 113 mmol/L — ABNORMAL HIGH (ref 98–107)
Co2: 29 mmol/L (ref 21–32)
Creatinine: 3.54 mg/dL — ABNORMAL HIGH (ref 0.60–1.30)
EGFR (African American): 22 — ABNORMAL LOW
EGFR (Non-African Amer.): 19 — ABNORMAL LOW
Glucose: 128 mg/dL — ABNORMAL HIGH (ref 65–99)
Osmolality: 314 (ref 275–301)
Potassium: 3.7 mmol/L (ref 3.5–5.1)
Sodium: 149 mmol/L — ABNORMAL HIGH (ref 136–145)

## 2013-07-01 LAB — PHOSPHORUS: Phosphorus: 4.9 mg/dL (ref 2.5–4.9)

## 2013-07-01 LAB — MAGNESIUM: Magnesium: 2.2 mg/dL

## 2013-07-01 LAB — LIPASE, BLOOD: Lipase: 211 U/L (ref 73–393)

## 2013-07-02 LAB — HEMOGLOBIN
HGB: 7.7 g/dL — AB (ref 13.0–18.0)
HGB: 7.5 g/dL — ABNORMAL LOW (ref 13.0–18.0)

## 2013-07-02 LAB — COMPREHENSIVE METABOLIC PANEL
AST: 29 U/L (ref 15–37)
Albumin: 1.2 g/dL — ABNORMAL LOW (ref 3.4–5.0)
Alkaline Phosphatase: 301 U/L — ABNORMAL HIGH
Anion Gap: 9 (ref 7–16)
BILIRUBIN TOTAL: 1.5 mg/dL — AB (ref 0.2–1.0)
BUN: 68 mg/dL — AB (ref 7–18)
CALCIUM: 7.8 mg/dL — AB (ref 8.5–10.1)
Chloride: 112 mmol/L — ABNORMAL HIGH (ref 98–107)
Co2: 31 mmol/L (ref 21–32)
Creatinine: 3.73 mg/dL — ABNORMAL HIGH (ref 0.60–1.30)
GFR CALC AF AMER: 21 — AB
GFR CALC NON AF AMER: 18 — AB
Glucose: 138 mg/dL — ABNORMAL HIGH (ref 65–99)
OSMOLALITY: 324 (ref 275–301)
Potassium: 3.9 mmol/L (ref 3.5–5.1)
SGPT (ALT): 24 U/L (ref 12–78)
Sodium: 152 mmol/L — ABNORMAL HIGH (ref 136–145)
Total Protein: 6.4 g/dL (ref 6.4–8.2)

## 2013-07-02 LAB — CBC WITH DIFFERENTIAL/PLATELET
BASOS ABS: 0.1 10*3/uL (ref 0.0–0.1)
Basophil %: 0.7 %
EOS ABS: 0.2 10*3/uL (ref 0.0–0.7)
EOS PCT: 2.1 %
HCT: 24 % — AB (ref 40.0–52.0)
HGB: 7.8 g/dL — ABNORMAL LOW (ref 13.0–18.0)
LYMPHS ABS: 1 10*3/uL (ref 1.0–3.6)
LYMPHS PCT: 14.1 %
MCH: 31.5 pg (ref 26.0–34.0)
MCHC: 32.5 g/dL (ref 32.0–36.0)
MCV: 97 fL (ref 80–100)
MONOS PCT: 7.1 %
Monocyte #: 0.5 x10 3/mm (ref 0.2–1.0)
NEUTROS ABS: 5.4 10*3/uL (ref 1.4–6.5)
Neutrophil %: 76 %
Platelet: 114 10*3/uL — ABNORMAL LOW (ref 150–440)
RBC: 2.48 10*6/uL — AB (ref 4.40–5.90)
RDW: 19.5 % — ABNORMAL HIGH (ref 11.5–14.5)
WBC: 7.1 10*3/uL (ref 3.8–10.6)

## 2013-07-02 LAB — CLOSTRIDIUM DIFFICILE(ARMC)

## 2013-07-02 LAB — MAGNESIUM: MAGNESIUM: 2.2 mg/dL

## 2013-07-02 LAB — PROTEIN / CREATININE RATIO, URINE
Creatinine, Urine: 26.7 mg/dL — ABNORMAL LOW (ref 30.0–125.0)
Protein, Random Urine: 66 mg/dL — ABNORMAL HIGH (ref 0–12)
Protein/Creat. Ratio: 2472 mg/gCREAT — ABNORMAL HIGH (ref 0–200)

## 2013-07-02 LAB — OCCULT BLOOD X 1 CARD TO LAB, STOOL: OCCULT BLOOD, FECES: POSITIVE

## 2013-07-02 LAB — PHOSPHORUS: PHOSPHORUS: 6.1 mg/dL — AB (ref 2.5–4.9)

## 2013-07-03 ENCOUNTER — Ambulatory Visit: Payer: Self-pay | Admitting: Neurology

## 2013-07-03 LAB — CBC WITH DIFFERENTIAL/PLATELET
Basophil #: 0.1 10*3/uL (ref 0.0–0.1)
Basophil %: 1.7 %
EOS ABS: 0.2 10*3/uL (ref 0.0–0.7)
Eosinophil %: 3.2 %
HCT: 23.4 % — AB (ref 40.0–52.0)
HGB: 7.5 g/dL — ABNORMAL LOW (ref 13.0–18.0)
Lymphocyte #: 1.1 10*3/uL (ref 1.0–3.6)
Lymphocyte %: 17 %
MCH: 30.9 pg (ref 26.0–34.0)
MCHC: 32.1 g/dL (ref 32.0–36.0)
MCV: 96 fL (ref 80–100)
MONOS PCT: 6.2 %
Monocyte #: 0.4 x10 3/mm (ref 0.2–1.0)
Neutrophil #: 4.5 10*3/uL (ref 1.4–6.5)
Neutrophil %: 71.9 %
PLATELETS: 122 10*3/uL — AB (ref 150–440)
RBC: 2.44 10*6/uL — ABNORMAL LOW (ref 4.40–5.90)
RDW: 18.8 % — ABNORMAL HIGH (ref 11.5–14.5)
WBC: 6.2 10*3/uL (ref 3.8–10.6)

## 2013-07-03 LAB — BASIC METABOLIC PANEL
Anion Gap: 4 — ABNORMAL LOW (ref 7–16)
BUN: 58 mg/dL — ABNORMAL HIGH (ref 7–18)
CHLORIDE: 115 mmol/L — AB (ref 98–107)
Calcium, Total: 7.7 mg/dL — ABNORMAL LOW (ref 8.5–10.1)
Co2: 34 mmol/L — ABNORMAL HIGH (ref 21–32)
Creatinine: 3.55 mg/dL — ABNORMAL HIGH (ref 0.60–1.30)
EGFR (Non-African Amer.): 19 — ABNORMAL LOW
GFR CALC AF AMER: 22 — AB
Glucose: 109 mg/dL — ABNORMAL HIGH (ref 65–99)
Osmolality: 320 (ref 275–301)
Potassium: 3.6 mmol/L (ref 3.5–5.1)
Sodium: 153 mmol/L — ABNORMAL HIGH (ref 136–145)

## 2013-07-03 LAB — HEMOGLOBIN
HGB: 8.3 g/dL — ABNORMAL LOW (ref 13.0–18.0)
HGB: 8 g/dL — ABNORMAL LOW (ref 13.0–18.0)

## 2013-07-03 LAB — PHOSPHORUS: PHOSPHORUS: 5.9 mg/dL — AB (ref 2.5–4.9)

## 2013-07-03 LAB — CULTURE, BLOOD (SINGLE)

## 2013-07-03 LAB — MAGNESIUM: MAGNESIUM: 2.3 mg/dL

## 2013-07-04 LAB — BASIC METABOLIC PANEL
ANION GAP: 5 — AB (ref 7–16)
Anion Gap: 4 — ABNORMAL LOW (ref 7–16)
Anion Gap: 1 — ABNORMAL LOW (ref 7–16)
BUN: 53 mg/dL — ABNORMAL HIGH (ref 7–18)
BUN: 47 mg/dL — ABNORMAL HIGH (ref 7–18)
BUN: 47 mg/dL — ABNORMAL HIGH (ref 7–18)
CALCIUM: 7.7 mg/dL — AB (ref 8.5–10.1)
CHLORIDE: 114 mmol/L — AB (ref 98–107)
CHLORIDE: 115 mmol/L — AB (ref 98–107)
CO2: 34 mmol/L — AB (ref 21–32)
CREATININE: 2.87 mg/dL — AB (ref 0.60–1.30)
CREATININE: 2.93 mg/dL — AB (ref 0.60–1.30)
CREATININE: 2.78 mg/dL — AB (ref 0.60–1.30)
Calcium, Total: 7.6 mg/dL — ABNORMAL LOW (ref 8.5–10.1)
Calcium, Total: 7.8 mg/dL — ABNORMAL LOW (ref 8.5–10.1)
Chloride: 115 mmol/L — ABNORMAL HIGH (ref 98–107)
Co2: 33 mmol/L — ABNORMAL HIGH (ref 21–32)
Co2: 36 mmol/L — ABNORMAL HIGH (ref 21–32)
EGFR (African American): 28 — ABNORMAL LOW
EGFR (African American): 28 — ABNORMAL LOW
EGFR (African American): 29 — ABNORMAL LOW
EGFR (Non-African Amer.): 24 — ABNORMAL LOW
EGFR (Non-African Amer.): 24 — ABNORMAL LOW
GFR CALC NON AF AMER: 25 — AB
GLUCOSE: 176 mg/dL — AB (ref 65–99)
GLUCOSE: 160 mg/dL — AB (ref 65–99)
Glucose: 155 mg/dL — ABNORMAL HIGH (ref 65–99)
Osmolality: 319 (ref 275–301)
Osmolality: 321 (ref 275–301)
Osmolality: 317 (ref 275–301)
POTASSIUM: 3 mmol/L — AB (ref 3.5–5.1)
Potassium: 3.5 mmol/L (ref 3.5–5.1)
Potassium: 3.4 mmol/L — ABNORMAL LOW (ref 3.5–5.1)
SODIUM: 154 mmol/L — AB (ref 136–145)
Sodium: 151 mmol/L — ABNORMAL HIGH (ref 136–145)
Sodium: 152 mmol/L — ABNORMAL HIGH (ref 136–145)

## 2013-07-04 LAB — MAGNESIUM: MAGNESIUM: 2.3 mg/dL

## 2013-07-04 LAB — VANCOMYCIN, TROUGH: VANCOMYCIN, TROUGH: 17 ug/mL (ref 10–20)

## 2013-07-04 LAB — CBC WITH DIFFERENTIAL/PLATELET
BASOS PCT: 1 %
Basophil #: 0.1 10*3/uL (ref 0.0–0.1)
Eosinophil #: 0.2 10*3/uL (ref 0.0–0.7)
Eosinophil %: 3.8 %
HCT: 24.7 % — ABNORMAL LOW (ref 40.0–52.0)
HGB: 7.7 g/dL — ABNORMAL LOW (ref 13.0–18.0)
LYMPHS ABS: 0.9 10*3/uL — AB (ref 1.0–3.6)
Lymphocyte %: 13.7 %
MCH: 30.1 pg (ref 26.0–34.0)
MCHC: 31.2 g/dL — ABNORMAL LOW (ref 32.0–36.0)
MCV: 96 fL (ref 80–100)
Monocyte #: 0.4 x10 3/mm (ref 0.2–1.0)
Monocyte %: 5.5 %
Neutrophil #: 4.9 10*3/uL (ref 1.4–6.5)
Neutrophil %: 76 %
Platelet: 152 10*3/uL (ref 150–440)
RBC: 2.56 10*6/uL — ABNORMAL LOW (ref 4.40–5.90)
RDW: 18.8 % — ABNORMAL HIGH (ref 11.5–14.5)
WBC: 6.5 10*3/uL (ref 3.8–10.6)

## 2013-07-04 LAB — CULTURE, BLOOD (SINGLE)

## 2013-07-04 LAB — PHOSPHORUS: Phosphorus: 4.2 mg/dL (ref 2.5–4.9)

## 2013-07-04 LAB — UR PROT ELECTROPHORESIS, URINE RANDOM

## 2013-07-05 LAB — BASIC METABOLIC PANEL
ANION GAP: 2 — AB (ref 7–16)
ANION GAP: 1 — AB (ref 7–16)
Anion Gap: 3 — ABNORMAL LOW (ref 7–16)
Anion Gap: 4 — ABNORMAL LOW (ref 7–16)
BUN: 46 mg/dL — ABNORMAL HIGH (ref 7–18)
BUN: 44 mg/dL — ABNORMAL HIGH (ref 7–18)
BUN: 42 mg/dL — ABNORMAL HIGH (ref 7–18)
BUN: 40 mg/dL — ABNORMAL HIGH (ref 7–18)
CREATININE: 2.25 mg/dL — AB (ref 0.60–1.30)
Calcium, Total: 7.6 mg/dL — ABNORMAL LOW (ref 8.5–10.1)
Calcium, Total: 7.9 mg/dL — ABNORMAL LOW (ref 8.5–10.1)
Calcium, Total: 7.6 mg/dL — ABNORMAL LOW (ref 8.5–10.1)
Calcium, Total: 7.9 mg/dL — ABNORMAL LOW (ref 8.5–10.1)
Chloride: 116 mmol/L — ABNORMAL HIGH (ref 98–107)
Chloride: 116 mmol/L — ABNORMAL HIGH (ref 98–107)
Chloride: 117 mmol/L — ABNORMAL HIGH (ref 98–107)
Chloride: 112 mmol/L — ABNORMAL HIGH (ref 98–107)
Co2: 35 mmol/L — ABNORMAL HIGH (ref 21–32)
Co2: 34 mmol/L — ABNORMAL HIGH (ref 21–32)
Co2: 34 mmol/L — ABNORMAL HIGH (ref 21–32)
Co2: 36 mmol/L — ABNORMAL HIGH (ref 21–32)
Creatinine: 2.41 mg/dL — ABNORMAL HIGH (ref 0.60–1.30)
Creatinine: 2.37 mg/dL — ABNORMAL HIGH (ref 0.60–1.30)
Creatinine: 2.31 mg/dL — ABNORMAL HIGH (ref 0.60–1.30)
EGFR (African American): 35 — ABNORMAL LOW
EGFR (African American): 36 — ABNORMAL LOW
EGFR (African American): 38 — ABNORMAL LOW
EGFR (Non-African Amer.): 30 — ABNORMAL LOW
EGFR (Non-African Amer.): 31 — ABNORMAL LOW
EGFR (Non-African Amer.): 33 — ABNORMAL LOW
EGFR (Non-African Amer.): 32 — ABNORMAL LOW
GFR CALC AF AMER: 37 — AB
GLUCOSE: 154 mg/dL — AB (ref 65–99)
GLUCOSE: 171 mg/dL — AB (ref 65–99)
Glucose: 164 mg/dL — ABNORMAL HIGH (ref 65–99)
Glucose: 186 mg/dL — ABNORMAL HIGH (ref 65–99)
OSMOLALITY: 318 (ref 275–301)
OSMOLALITY: 316 (ref 275–301)
Osmolality: 317 (ref 275–301)
Osmolality: 319 (ref 275–301)
POTASSIUM: 3.9 mmol/L (ref 3.5–5.1)
Potassium: 3.6 mmol/L (ref 3.5–5.1)
Potassium: 3.7 mmol/L (ref 3.5–5.1)
Potassium: 3.4 mmol/L — ABNORMAL LOW (ref 3.5–5.1)
Sodium: 152 mmol/L — ABNORMAL HIGH (ref 136–145)
Sodium: 153 mmol/L — ABNORMAL HIGH (ref 136–145)
Sodium: 153 mmol/L — ABNORMAL HIGH (ref 136–145)
Sodium: 152 mmol/L — ABNORMAL HIGH (ref 136–145)

## 2013-07-05 LAB — HEMOGLOBIN
HGB: 7.8 g/dL — ABNORMAL LOW (ref 13.0–18.0)
HGB: 7.6 g/dL — ABNORMAL LOW (ref 13.0–18.0)
HGB: 7.5 g/dL — AB (ref 13.0–18.0)

## 2013-07-05 LAB — PROTEIN ELECTROPHORESIS(ARMC)

## 2013-07-06 LAB — BASIC METABOLIC PANEL
ANION GAP: 3 — AB (ref 7–16)
ANION GAP: 2 — AB (ref 7–16)
ANION GAP: 1 — AB (ref 7–16)
BUN: 35 mg/dL — AB (ref 7–18)
BUN: 33 mg/dL — ABNORMAL HIGH (ref 7–18)
BUN: 35 mg/dL — AB (ref 7–18)
CHLORIDE: 109 mmol/L — AB (ref 98–107)
CHLORIDE: 109 mmol/L — AB (ref 98–107)
CO2: 36 mmol/L — AB (ref 21–32)
CREATININE: 2.08 mg/dL — AB (ref 0.60–1.30)
CREATININE: 2 mg/dL — AB (ref 0.60–1.30)
Calcium, Total: 7.8 mg/dL — ABNORMAL LOW (ref 8.5–10.1)
Calcium, Total: 7.7 mg/dL — ABNORMAL LOW (ref 8.5–10.1)
Calcium, Total: 7.4 mg/dL — ABNORMAL LOW (ref 8.5–10.1)
Chloride: 108 mmol/L — ABNORMAL HIGH (ref 98–107)
Co2: 36 mmol/L — ABNORMAL HIGH (ref 21–32)
Co2: 36 mmol/L — ABNORMAL HIGH (ref 21–32)
Creatinine: 1.88 mg/dL — ABNORMAL HIGH (ref 0.60–1.30)
EGFR (African American): 44 — ABNORMAL LOW
EGFR (Non-African Amer.): 38 — ABNORMAL LOW
GFR CALC AF AMER: 42 — AB
GFR CALC AF AMER: 47 — AB
GFR CALC NON AF AMER: 36 — AB
GFR CALC NON AF AMER: 41 — AB
GLUCOSE: 188 mg/dL — AB (ref 65–99)
GLUCOSE: 178 mg/dL — AB (ref 65–99)
Glucose: 193 mg/dL — ABNORMAL HIGH (ref 65–99)
OSMOLALITY: 304 (ref 275–301)
OSMOLALITY: 304 (ref 275–301)
Osmolality: 305 (ref 275–301)
POTASSIUM: 3.5 mmol/L (ref 3.5–5.1)
Potassium: 3.2 mmol/L — ABNORMAL LOW (ref 3.5–5.1)
Potassium: 3.7 mmol/L (ref 3.5–5.1)
SODIUM: 146 mmol/L — AB (ref 136–145)
Sodium: 147 mmol/L — ABNORMAL HIGH (ref 136–145)
Sodium: 147 mmol/L — ABNORMAL HIGH (ref 136–145)

## 2013-07-06 LAB — PHOSPHORUS: PHOSPHORUS: 3.9 mg/dL (ref 2.5–4.9)

## 2013-07-06 LAB — EXPECTORATED SPUTUM ASSESSMENT W REFEX TO RESP CULTURE

## 2013-07-06 LAB — ALBUMIN: ALBUMIN: 1.4 g/dL — AB (ref 3.4–5.0)

## 2013-07-06 LAB — MAGNESIUM: MAGNESIUM: 2 mg/dL

## 2013-07-07 LAB — BASIC METABOLIC PANEL
ANION GAP: 3 — AB (ref 7–16)
Anion Gap: 4 — ABNORMAL LOW (ref 7–16)
BUN: 33 mg/dL — AB (ref 7–18)
BUN: 33 mg/dL — AB (ref 7–18)
CALCIUM: 7.6 mg/dL — AB (ref 8.5–10.1)
CHLORIDE: 107 mmol/L (ref 98–107)
CO2: 35 mmol/L — AB (ref 21–32)
CREATININE: 1.84 mg/dL — AB (ref 0.60–1.30)
Calcium, Total: 7.7 mg/dL — ABNORMAL LOW (ref 8.5–10.1)
Chloride: 107 mmol/L (ref 98–107)
Co2: 36 mmol/L — ABNORMAL HIGH (ref 21–32)
Creatinine: 1.85 mg/dL — ABNORMAL HIGH (ref 0.60–1.30)
EGFR (African American): 48 — ABNORMAL LOW
EGFR (African American): 48 — ABNORMAL LOW
EGFR (Non-African Amer.): 42 — ABNORMAL LOW
GFR CALC NON AF AMER: 42 — AB
GLUCOSE: 191 mg/dL — AB (ref 65–99)
Glucose: 196 mg/dL — ABNORMAL HIGH (ref 65–99)
OSMOLALITY: 303 (ref 275–301)
Osmolality: 303 (ref 275–301)
POTASSIUM: 3.7 mmol/L (ref 3.5–5.1)
Potassium: 3.3 mmol/L — ABNORMAL LOW (ref 3.5–5.1)
Sodium: 146 mmol/L — ABNORMAL HIGH (ref 136–145)
Sodium: 146 mmol/L — ABNORMAL HIGH (ref 136–145)

## 2013-07-07 LAB — MAGNESIUM: Magnesium: 1.9 mg/dL

## 2013-07-07 LAB — PHOSPHORUS: Phosphorus: 3 mg/dL (ref 2.5–4.9)

## 2013-07-08 LAB — BASIC METABOLIC PANEL
Anion Gap: 6 — ABNORMAL LOW (ref 7–16)
BUN: 32 mg/dL — AB (ref 7–18)
Calcium, Total: 7.8 mg/dL — ABNORMAL LOW (ref 8.5–10.1)
Chloride: 108 mmol/L — ABNORMAL HIGH (ref 98–107)
Co2: 35 mmol/L — ABNORMAL HIGH (ref 21–32)
Creatinine: 1.62 mg/dL — ABNORMAL HIGH (ref 0.60–1.30)
EGFR (Non-African Amer.): 49 — ABNORMAL LOW
GFR CALC AF AMER: 57 — AB
GLUCOSE: 157 mg/dL — AB (ref 65–99)
Osmolality: 306 (ref 275–301)
Potassium: 3.4 mmol/L — ABNORMAL LOW (ref 3.5–5.1)
SODIUM: 149 mmol/L — AB (ref 136–145)

## 2013-07-08 LAB — MAGNESIUM: Magnesium: 2.1 mg/dL

## 2013-07-09 LAB — URINALYSIS, COMPLETE
BILIRUBIN, UR: NEGATIVE
Bacteria: NONE SEEN
Glucose,UR: NEGATIVE mg/dL (ref 0–75)
KETONE: NEGATIVE
Leukocyte Esterase: NEGATIVE
Nitrite: NEGATIVE
PH: 9 (ref 4.5–8.0)
PROTEIN: NEGATIVE
SPECIFIC GRAVITY: 1.006 (ref 1.003–1.030)
Squamous Epithelial: NONE SEEN
WBC UR: 3 /HPF (ref 0–5)

## 2013-07-09 LAB — PHOSPHORUS: Phosphorus: 3.3 mg/dL (ref 2.5–4.9)

## 2013-07-09 LAB — MAGNESIUM: MAGNESIUM: 2.2 mg/dL

## 2013-07-09 LAB — POTASSIUM: Potassium: 3.3 mmol/L — ABNORMAL LOW (ref 3.5–5.1)

## 2013-07-09 LAB — CULTURE, BLOOD (SINGLE)

## 2013-07-09 LAB — CALCIUM: Calcium, Total: 8.7 mg/dL (ref 8.5–10.1)

## 2013-07-09 LAB — SODIUM: Sodium: 154 mmol/L — ABNORMAL HIGH (ref 136–145)

## 2013-07-09 LAB — ALBUMIN: ALBUMIN: 1.8 g/dL — AB (ref 3.4–5.0)

## 2013-07-10 LAB — CBC WITH DIFFERENTIAL/PLATELET
BASOS ABS: 0.1 10*3/uL (ref 0.0–0.1)
Basophil %: 0.8 %
EOS ABS: 0.3 10*3/uL (ref 0.0–0.7)
Eosinophil %: 3.3 %
HCT: 27.9 % — ABNORMAL LOW (ref 40.0–52.0)
HGB: 8.9 g/dL — ABNORMAL LOW (ref 13.0–18.0)
LYMPHS ABS: 2 10*3/uL (ref 1.0–3.6)
Lymphocyte %: 22.6 %
MCH: 31.1 pg (ref 26.0–34.0)
MCHC: 32 g/dL (ref 32.0–36.0)
MCV: 97 fL (ref 80–100)
Monocyte #: 0.8 x10 3/mm (ref 0.2–1.0)
Monocyte %: 9.3 %
NEUTROS ABS: 5.8 10*3/uL (ref 1.4–6.5)
Neutrophil %: 64 %
PLATELETS: 457 10*3/uL — AB (ref 150–440)
RBC: 2.88 10*6/uL — AB (ref 4.40–5.90)
RDW: 19 % — AB (ref 11.5–14.5)
WBC: 9 10*3/uL (ref 3.8–10.6)

## 2013-07-10 LAB — BASIC METABOLIC PANEL
Anion Gap: 7 (ref 7–16)
BUN: 39 mg/dL — AB (ref 7–18)
CHLORIDE: 118 mmol/L — AB (ref 98–107)
Calcium, Total: 8.4 mg/dL — ABNORMAL LOW (ref 8.5–10.1)
Co2: 33 mmol/L — ABNORMAL HIGH (ref 21–32)
Creatinine: 1.87 mg/dL — ABNORMAL HIGH (ref 0.60–1.30)
EGFR (African American): 48 — ABNORMAL LOW
EGFR (Non-African Amer.): 41 — ABNORMAL LOW
Glucose: 126 mg/dL — ABNORMAL HIGH (ref 65–99)
OSMOLALITY: 324 (ref 275–301)
Potassium: 3.4 mmol/L — ABNORMAL LOW (ref 3.5–5.1)
Sodium: 158 mmol/L — ABNORMAL HIGH (ref 136–145)

## 2013-07-11 LAB — CBC WITH DIFFERENTIAL/PLATELET
BASOS PCT: 1 %
Basophil #: 0.1 10*3/uL (ref 0.0–0.1)
Eosinophil #: 0.3 10*3/uL (ref 0.0–0.7)
Eosinophil %: 3.4 %
HCT: 29.4 % — ABNORMAL LOW (ref 40.0–52.0)
HGB: 9.4 g/dL — ABNORMAL LOW (ref 13.0–18.0)
LYMPHS PCT: 27 %
Lymphocyte #: 2.6 10*3/uL (ref 1.0–3.6)
MCH: 31 pg (ref 26.0–34.0)
MCHC: 31.9 g/dL — ABNORMAL LOW (ref 32.0–36.0)
MCV: 97 fL (ref 80–100)
Monocyte #: 0.8 x10 3/mm (ref 0.2–1.0)
Monocyte %: 8.8 %
Neutrophil #: 5.8 10*3/uL (ref 1.4–6.5)
Neutrophil %: 59.8 %
Platelet: 512 10*3/uL — ABNORMAL HIGH (ref 150–440)
RBC: 3.02 10*6/uL — AB (ref 4.40–5.90)
RDW: 18.7 % — ABNORMAL HIGH (ref 11.5–14.5)
WBC: 9.7 10*3/uL (ref 3.8–10.6)

## 2013-07-11 LAB — BASIC METABOLIC PANEL
BUN: 38 mg/dL — AB (ref 7–18)
CHLORIDE: 125 mmol/L — AB (ref 98–107)
Calcium, Total: 8.6 mg/dL (ref 8.5–10.1)
Co2: 30 mmol/L (ref 21–32)
Creatinine: 1.98 mg/dL — ABNORMAL HIGH (ref 0.60–1.30)
EGFR (African American): 44 — ABNORMAL LOW
EGFR (Non-African Amer.): 38 — ABNORMAL LOW
Glucose: 157 mg/dL — ABNORMAL HIGH (ref 65–99)
Potassium: 3.3 mmol/L — ABNORMAL LOW (ref 3.5–5.1)
Sodium: >160 mmol/L (ref 136–145)

## 2013-07-11 LAB — MAGNESIUM: MAGNESIUM: 2.3 mg/dL

## 2013-07-11 LAB — PHOSPHORUS: Phosphorus: 3.9 mg/dL (ref 2.5–4.9)

## 2013-07-12 LAB — CREATININE, SERUM
CREATININE: 1.93 mg/dL — AB (ref 0.60–1.30)
EGFR (African American): 46 — ABNORMAL LOW
EGFR (Non-African Amer.): 39 — ABNORMAL LOW

## 2013-07-12 LAB — POTASSIUM: Potassium: 3.6 mmol/L (ref 3.5–5.1)

## 2013-07-12 LAB — CLOSTRIDIUM DIFFICILE(ARMC)

## 2013-07-12 LAB — URINALYSIS, COMPLETE
Bilirubin,UR: NEGATIVE
Glucose,UR: 50 mg/dL (ref 0–75)
KETONE: NEGATIVE
Nitrite: NEGATIVE
Ph: 5 (ref 4.5–8.0)
Protein: NEGATIVE
RBC,UR: <1 /HPF (ref 0–5)
Specific Gravity: 1.008 (ref 1.003–1.030)
Squamous Epithelial: NONE SEEN

## 2013-07-12 LAB — SODIUM

## 2013-07-13 LAB — BASIC METABOLIC PANEL
BUN: 32 mg/dL — ABNORMAL HIGH (ref 7–18)
CALCIUM: 8 mg/dL — AB (ref 8.5–10.1)
Chloride: 126 mmol/L — ABNORMAL HIGH (ref 98–107)
Co2: 29 mmol/L (ref 21–32)
Creatinine: 1.91 mg/dL — ABNORMAL HIGH (ref 0.60–1.30)
EGFR (Non-African Amer.): 40 — ABNORMAL LOW
GFR CALC AF AMER: 46 — AB
Glucose: 216 mg/dL — ABNORMAL HIGH (ref 65–99)
POTASSIUM: 4 mmol/L (ref 3.5–5.1)

## 2013-07-13 LAB — SODIUM: Sodium: 158 mmol/L — ABNORMAL HIGH (ref 136–145)

## 2013-07-14 LAB — CBC WITH DIFFERENTIAL/PLATELET
Basophil #: 0.1 10*3/uL (ref 0.0–0.1)
Basophil %: 0.4 %
EOS ABS: 0.4 10*3/uL (ref 0.0–0.7)
EOS PCT: 3.5 %
HCT: 28.2 % — ABNORMAL LOW (ref 40.0–52.0)
HGB: 8.6 g/dL — ABNORMAL LOW (ref 13.0–18.0)
LYMPHS PCT: 22.6 %
Lymphocyte #: 2.9 10*3/uL (ref 1.0–3.6)
MCH: 29.9 pg (ref 26.0–34.0)
MCHC: 30.5 g/dL — ABNORMAL LOW (ref 32.0–36.0)
MCV: 98 fL (ref 80–100)
MONO ABS: 1 x10 3/mm (ref 0.2–1.0)
MONOS PCT: 7.5 %
NEUTROS ABS: 8.4 10*3/uL — AB (ref 1.4–6.5)
Neutrophil %: 66 %
Platelet: 347 10*3/uL (ref 150–440)
RBC: 2.88 10*6/uL — ABNORMAL LOW (ref 4.40–5.90)
RDW: 19.4 % — ABNORMAL HIGH (ref 11.5–14.5)
WBC: 12.7 10*3/uL — ABNORMAL HIGH (ref 3.8–10.6)

## 2013-07-14 LAB — BASIC METABOLIC PANEL
BUN: 29 mg/dL — AB (ref 7–18)
CALCIUM: 8 mg/dL — AB (ref 8.5–10.1)
CHLORIDE: 125 mmol/L — AB (ref 98–107)
CO2: 29 mmol/L (ref 21–32)
Creatinine: 1.65 mg/dL — ABNORMAL HIGH (ref 0.60–1.30)
EGFR (African American): 55 — ABNORMAL LOW
EGFR (Non-African Amer.): 48 — ABNORMAL LOW
Glucose: 171 mg/dL — ABNORMAL HIGH (ref 65–99)
Potassium: 4.2 mmol/L (ref 3.5–5.1)
Sodium: >160 mmol/L (ref 136–145)

## 2013-07-14 LAB — CULTURE, BLOOD (SINGLE)

## 2013-07-14 LAB — URINE CULTURE

## 2013-07-14 LAB — SODIUM
SODIUM: 153 mmol/L — AB (ref 136–145)
Sodium: 155 mmol/L — ABNORMAL HIGH (ref 136–145)

## 2013-07-15 ENCOUNTER — Ambulatory Visit: Payer: Self-pay | Admitting: Nurse Practitioner

## 2013-07-15 ENCOUNTER — Ambulatory Visit: Payer: Self-pay | Admitting: Internal Medicine

## 2013-07-15 LAB — RENAL FUNCTION PANEL
Albumin: 2.1 g/dL — ABNORMAL LOW (ref 3.4–5.0)
Anion Gap: 6 — ABNORMAL LOW (ref 7–16)
BUN: 26 mg/dL — ABNORMAL HIGH (ref 7–18)
CALCIUM: 8 mg/dL — AB (ref 8.5–10.1)
CHLORIDE: 120 mmol/L — AB (ref 98–107)
CO2: 27 mmol/L (ref 21–32)
CREATININE: 1.52 mg/dL — AB (ref 0.60–1.30)
EGFR (African American): >60
EGFR (Non-African Amer.): 53 — ABNORMAL LOW
Glucose: 220 mg/dL — ABNORMAL HIGH (ref 65–99)
Osmolality: 315 (ref 275–301)
POTASSIUM: 4.5 mmol/L (ref 3.5–5.1)
Phosphorus: 3.5 mg/dL (ref 2.5–4.9)
SODIUM: 153 mmol/L — AB (ref 136–145)

## 2013-07-15 LAB — SODIUM
Sodium: 153 mmol/L — ABNORMAL HIGH (ref 136–145)
Sodium: 152 mmol/L — ABNORMAL HIGH (ref 136–145)
Sodium: 140 mmol/L (ref 136–145)

## 2013-07-16 LAB — SODIUM
Sodium: 153 mmol/L — ABNORMAL HIGH (ref 136–145)
Sodium: 154 mmol/L — ABNORMAL HIGH (ref 136–145)
Sodium: 154 mmol/L — ABNORMAL HIGH (ref 136–145)

## 2013-07-16 LAB — BASIC METABOLIC PANEL
Anion Gap: 8 (ref 7–16)
BUN: 27 mg/dL — ABNORMAL HIGH (ref 7–18)
Calcium, Total: 8.8 mg/dL (ref 8.5–10.1)
Chloride: 117 mmol/L — ABNORMAL HIGH (ref 98–107)
Co2: 29 mmol/L (ref 21–32)
Creatinine: 1.41 mg/dL — ABNORMAL HIGH (ref 0.60–1.30)
EGFR (African American): >60
EGFR (Non-African Amer.): 58 — ABNORMAL LOW
GLUCOSE: 188 mg/dL — AB (ref 65–99)
Osmolality: 316 (ref 275–301)
Potassium: 4 mmol/L (ref 3.5–5.1)
Sodium: 154 mmol/L — ABNORMAL HIGH (ref 136–145)

## 2013-07-17 LAB — SODIUM
SODIUM: 149 mmol/L — AB (ref 136–145)
Sodium: 152 mmol/L — ABNORMAL HIGH (ref 136–145)

## 2013-07-17 LAB — CBC WITH DIFFERENTIAL/PLATELET
BASOS PCT: 1.3 %
Basophil #: 0.2 10*3/uL — ABNORMAL HIGH (ref 0.0–0.1)
Eosinophil #: 0.2 10*3/uL (ref 0.0–0.7)
Eosinophil %: 1.9 %
HCT: 27.9 % — AB (ref 40.0–52.0)
HGB: 8.6 g/dL — AB (ref 13.0–18.0)
LYMPHS PCT: 20.7 %
Lymphocyte #: 2.5 10*3/uL (ref 1.0–3.6)
MCH: 29.4 pg (ref 26.0–34.0)
MCHC: 30.8 g/dL — ABNORMAL LOW (ref 32.0–36.0)
MCV: 95 fL (ref 80–100)
Monocyte #: 0.5 x10 3/mm (ref 0.2–1.0)
Monocyte %: 4 %
NEUTROS PCT: 72.1 %
Neutrophil #: 8.8 10*3/uL — ABNORMAL HIGH (ref 1.4–6.5)
PLATELETS: 286 10*3/uL (ref 150–440)
RBC: 2.92 10*6/uL — ABNORMAL LOW (ref 4.40–5.90)
RDW: 18.8 % — ABNORMAL HIGH (ref 11.5–14.5)
WBC: 12.2 10*3/uL — AB (ref 3.8–10.6)

## 2013-07-18 LAB — CULTURE, BLOOD (SINGLE)

## 2013-07-18 LAB — SODIUM
Sodium: 151 mmol/L — ABNORMAL HIGH (ref 136–145)
Sodium: 152 mmol/L — ABNORMAL HIGH (ref 136–145)

## 2013-07-19 LAB — BASIC METABOLIC PANEL
Anion Gap: 5 — ABNORMAL LOW (ref 7–16)
BUN: 24 mg/dL — ABNORMAL HIGH (ref 7–18)
Calcium, Total: 8.5 mg/dL (ref 8.5–10.1)
Chloride: 111 mmol/L — ABNORMAL HIGH (ref 98–107)
Co2: 30 mmol/L (ref 21–32)
Creatinine: 1.13 mg/dL (ref 0.60–1.30)
EGFR (African American): >60
EGFR (Non-African Amer.): >60
Glucose: 241 mg/dL — ABNORMAL HIGH (ref 65–99)
Osmolality: 303 (ref 275–301)
Potassium: 3.7 mmol/L (ref 3.5–5.1)
Sodium: 146 mmol/L — ABNORMAL HIGH (ref 136–145)

## 2013-07-19 LAB — SODIUM: Sodium: 148 mmol/L — ABNORMAL HIGH (ref 136–145)

## 2013-07-19 LAB — PROTIME-INR
INR: 1.1
Prothrombin Time: 14.3 secs (ref 11.5–14.7)

## 2013-07-19 LAB — HEMOGLOBIN: HGB: 8.2 g/dL — AB (ref 13.0–18.0)

## 2013-07-20 LAB — BASIC METABOLIC PANEL
Anion Gap: 2 — ABNORMAL LOW (ref 7–16)
BUN: 23 mg/dL — AB (ref 7–18)
CALCIUM: 8.3 mg/dL — AB (ref 8.5–10.1)
CO2: 30 mmol/L (ref 21–32)
Chloride: 113 mmol/L — ABNORMAL HIGH (ref 98–107)
Creatinine: 1.26 mg/dL (ref 0.60–1.30)
EGFR (Non-African Amer.): >60
GLUCOSE: 216 mg/dL — AB (ref 65–99)
Osmolality: 299 (ref 275–301)
Potassium: 3.9 mmol/L (ref 3.5–5.1)
Sodium: 145 mmol/L (ref 136–145)

## 2013-07-20 LAB — PROTIME-INR
INR: 1.1
PROTHROMBIN TIME: 13.8 s (ref 11.5–14.7)

## 2013-07-21 LAB — CBC WITH DIFFERENTIAL/PLATELET
BASOS ABS: 0.1 10*3/uL (ref 0.0–0.1)
Basophil %: 0.3 %
EOS ABS: 0.1 10*3/uL (ref 0.0–0.7)
Eosinophil %: 0.7 %
HCT: 27.1 % — ABNORMAL LOW (ref 40.0–52.0)
HGB: 8.5 g/dL — ABNORMAL LOW (ref 13.0–18.0)
LYMPHS ABS: 1.9 10*3/uL (ref 1.0–3.6)
LYMPHS PCT: 12 %
MCH: 29.9 pg (ref 26.0–34.0)
MCHC: 31.4 g/dL — AB (ref 32.0–36.0)
MCV: 95 fL (ref 80–100)
MONO ABS: 1.1 x10 3/mm — AB (ref 0.2–1.0)
MONOS PCT: 7 %
NEUTROS ABS: 12.9 10*3/uL — AB (ref 1.4–6.5)
Neutrophil %: 80 %
Platelet: 263 10*3/uL (ref 150–440)
RBC: 2.85 10*6/uL — ABNORMAL LOW (ref 4.40–5.90)
RDW: 18.6 % — ABNORMAL HIGH (ref 11.5–14.5)
WBC: 16.1 10*3/uL — ABNORMAL HIGH (ref 3.8–10.6)

## 2013-07-21 LAB — BASIC METABOLIC PANEL
Anion Gap: 4 — ABNORMAL LOW (ref 7–16)
BUN: 25 mg/dL — ABNORMAL HIGH (ref 7–18)
CHLORIDE: 111 mmol/L — AB (ref 98–107)
CREATININE: 1.07 mg/dL (ref 0.60–1.30)
Calcium, Total: 8.4 mg/dL — ABNORMAL LOW (ref 8.5–10.1)
Co2: 30 mmol/L (ref 21–32)
EGFR (African American): >60
EGFR (Non-African Amer.): >60
Glucose: 221 mg/dL — ABNORMAL HIGH (ref 65–99)
Osmolality: 300 (ref 275–301)
POTASSIUM: 3.9 mmol/L (ref 3.5–5.1)
Sodium: 145 mmol/L (ref 136–145)

## 2013-07-21 LAB — PLATELET COUNT: PLATELETS: 268 10*3/uL (ref 150–440)

## 2013-07-22 LAB — BASIC METABOLIC PANEL
ANION GAP: 5 — AB (ref 7–16)
BUN: 28 mg/dL — ABNORMAL HIGH (ref 7–18)
CO2: 28 mmol/L (ref 21–32)
Calcium, Total: 8.1 mg/dL — ABNORMAL LOW (ref 8.5–10.1)
Chloride: 116 mmol/L — ABNORMAL HIGH (ref 98–107)
Creatinine: 1.44 mg/dL — ABNORMAL HIGH (ref 0.60–1.30)
EGFR (African American): >60
EGFR (Non-African Amer.): 56 — ABNORMAL LOW
Glucose: 215 mg/dL — ABNORMAL HIGH (ref 65–99)
OSMOLALITY: 308 (ref 275–301)
Potassium: 3.9 mmol/L (ref 3.5–5.1)
SODIUM: 149 mmol/L — AB (ref 136–145)

## 2013-07-22 LAB — PROTIME-INR
INR: 1.3
Prothrombin Time: 16.2 secs — ABNORMAL HIGH (ref 11.5–14.7)

## 2013-07-22 LAB — CLOSTRIDIUM DIFFICILE(ARMC)

## 2013-07-23 LAB — BASIC METABOLIC PANEL
Anion Gap: 6 — ABNORMAL LOW (ref 7–16)
BUN: 26 mg/dL — AB (ref 7–18)
CO2: 28 mmol/L (ref 21–32)
Calcium, Total: 7.9 mg/dL — ABNORMAL LOW (ref 8.5–10.1)
Chloride: 114 mmol/L — ABNORMAL HIGH (ref 98–107)
Creatinine: 1.2 mg/dL (ref 0.60–1.30)
EGFR (African American): >60
Glucose: 184 mg/dL — ABNORMAL HIGH (ref 65–99)
Osmolality: 304 (ref 275–301)
Potassium: 3.7 mmol/L (ref 3.5–5.1)
Sodium: 148 mmol/L — ABNORMAL HIGH (ref 136–145)

## 2013-07-23 LAB — PROTIME-INR
INR: 1.4
PROTHROMBIN TIME: 16.6 s — AB (ref 11.5–14.7)

## 2013-07-23 LAB — EXPECTORATED SPUTUM ASSESSMENT W REFEX TO RESP CULTURE

## 2013-07-24 LAB — BASIC METABOLIC PANEL
ANION GAP: 4 — AB (ref 7–16)
Anion Gap: 5 — ABNORMAL LOW (ref 7–16)
BUN: 15 mg/dL (ref 7–18)
BUN: 13 mg/dL (ref 7–18)
CALCIUM: 8.2 mg/dL — AB (ref 8.5–10.1)
CREATININE: 0.78 mg/dL (ref 0.60–1.30)
Calcium, Total: 8.1 mg/dL — ABNORMAL LOW (ref 8.5–10.1)
Chloride: 109 mmol/L — ABNORMAL HIGH (ref 98–107)
Chloride: 107 mmol/L (ref 98–107)
Co2: 30 mmol/L (ref 21–32)
Co2: 31 mmol/L (ref 21–32)
Creatinine: 0.78 mg/dL (ref 0.60–1.30)
EGFR (African American): >60
EGFR (Non-African Amer.): >60
Glucose: 178 mg/dL — ABNORMAL HIGH (ref 65–99)
Glucose: 218 mg/dL — ABNORMAL HIGH (ref 65–99)
Osmolality: 292 (ref 275–301)
Osmolality: 290 (ref 275–301)
Potassium: 3.5 mmol/L (ref 3.5–5.1)
Potassium: 3.5 mmol/L (ref 3.5–5.1)
SODIUM: 144 mmol/L (ref 136–145)
Sodium: 142 mmol/L (ref 136–145)

## 2013-07-24 LAB — CBC WITH DIFFERENTIAL/PLATELET
Basophil #: 0 10*3/uL (ref 0.0–0.1)
Basophil %: 0.4 %
EOS ABS: 0.3 10*3/uL (ref 0.0–0.7)
Eosinophil %: 3.1 %
HCT: 23 % — AB (ref 40.0–52.0)
HGB: 7.3 g/dL — AB (ref 13.0–18.0)
LYMPHS ABS: 1.5 10*3/uL (ref 1.0–3.6)
LYMPHS PCT: 17.5 %
MCH: 29.7 pg (ref 26.0–34.0)
MCHC: 31.6 g/dL — AB (ref 32.0–36.0)
MCV: 94 fL (ref 80–100)
MONO ABS: 0.6 x10 3/mm (ref 0.2–1.0)
MONOS PCT: 7.3 %
Neutrophil #: 6.1 10*3/uL (ref 1.4–6.5)
Neutrophil %: 71.7 %
PLATELETS: 176 10*3/uL (ref 150–440)
RBC: 2.44 10*6/uL — ABNORMAL LOW (ref 4.40–5.90)
RDW: 18.7 % — AB (ref 11.5–14.5)
WBC: 8.6 10*3/uL (ref 3.8–10.6)

## 2013-07-24 LAB — PROTIME-INR
INR: 1.3
PROTHROMBIN TIME: 16.3 s — AB (ref 11.5–14.7)

## 2013-07-25 LAB — CULTURE, BLOOD (SINGLE)

## 2013-07-25 LAB — BASIC METABOLIC PANEL
Anion Gap: 6 — ABNORMAL LOW (ref 7–16)
BUN: 12 mg/dL (ref 7–18)
Calcium, Total: 8.7 mg/dL (ref 8.5–10.1)
Chloride: 110 mmol/L — ABNORMAL HIGH (ref 98–107)
Co2: 31 mmol/L (ref 21–32)
Creatinine: 0.74 mg/dL (ref 0.60–1.30)
EGFR (African American): >60
EGFR (Non-African Amer.): >60
Glucose: 179 mg/dL — ABNORMAL HIGH (ref 65–99)
Osmolality: 297 (ref 275–301)
Potassium: 3.5 mmol/L (ref 3.5–5.1)
Sodium: 147 mmol/L — ABNORMAL HIGH (ref 136–145)

## 2013-07-25 LAB — PROTIME-INR
INR: 1.5
PROTHROMBIN TIME: 17.6 s — AB (ref 11.5–14.7)

## 2013-07-25 LAB — VANCOMYCIN, TROUGH: Vancomycin, Trough: 11 ug/mL (ref 10–20)

## 2013-07-26 LAB — CBC WITH DIFFERENTIAL/PLATELET
Bands: 1 %
HCT: 26 % — AB (ref 40.0–52.0)
HGB: 8.3 g/dL — AB (ref 13.0–18.0)
Lymphocytes: 23 %
MCH: 29.7 pg (ref 26.0–34.0)
MCHC: 31.8 g/dL — ABNORMAL LOW (ref 32.0–36.0)
MCV: 94 fL (ref 80–100)
METAMYELOCYTE: 2 %
Monocytes: 6 %
PLATELETS: 216 10*3/uL (ref 150–440)
RBC: 2.78 10*6/uL — ABNORMAL LOW (ref 4.40–5.90)
RDW: 18.7 % — ABNORMAL HIGH (ref 11.5–14.5)
SEGMENTED NEUTROPHILS: 68 %
WBC: 11.4 10*3/uL — ABNORMAL HIGH (ref 3.8–10.6)

## 2013-07-26 LAB — BASIC METABOLIC PANEL
Anion Gap: 4 — ABNORMAL LOW (ref 7–16)
BUN: 11 mg/dL (ref 7–18)
CHLORIDE: 111 mmol/L — AB (ref 98–107)
CO2: 30 mmol/L (ref 21–32)
CREATININE: 0.84 mg/dL (ref 0.60–1.30)
Calcium, Total: 9.2 mg/dL (ref 8.5–10.1)
Glucose: 135 mg/dL — ABNORMAL HIGH (ref 65–99)
Osmolality: 290 (ref 275–301)
Potassium: 4.1 mmol/L (ref 3.5–5.1)
SODIUM: 145 mmol/L (ref 136–145)

## 2013-07-26 LAB — PROTIME-INR
INR: 1.6
PROTHROMBIN TIME: 18.5 s — AB (ref 11.5–14.7)

## 2013-07-26 LAB — CULTURE, BLOOD (SINGLE)

## 2013-07-27 LAB — BASIC METABOLIC PANEL
ANION GAP: 5 — AB (ref 7–16)
BUN: 13 mg/dL (ref 7–18)
Calcium, Total: 9.1 mg/dL (ref 8.5–10.1)
Chloride: 102 mmol/L (ref 98–107)
Co2: 33 mmol/L — ABNORMAL HIGH (ref 21–32)
Creatinine: 0.74 mg/dL (ref 0.60–1.30)
EGFR (Non-African Amer.): >60
Glucose: 140 mg/dL — ABNORMAL HIGH (ref 65–99)
Osmolality: 282 (ref 275–301)
POTASSIUM: 3.9 mmol/L (ref 3.5–5.1)
Sodium: 140 mmol/L (ref 136–145)

## 2013-07-27 LAB — PROTIME-INR
INR: 1.5
PROTHROMBIN TIME: 18.2 s — AB (ref 11.5–14.7)

## 2013-07-28 LAB — PROTIME-INR
INR: 1.7
Prothrombin Time: 19.6 secs — ABNORMAL HIGH (ref 11.5–14.7)

## 2013-07-28 LAB — HEMOGLOBIN: HGB: 7.9 g/dL — AB (ref 13.0–18.0)

## 2013-07-29 LAB — BASIC METABOLIC PANEL
Anion Gap: 5 — ABNORMAL LOW (ref 7–16)
BUN: 9 mg/dL (ref 7–18)
CALCIUM: 8.6 mg/dL (ref 8.5–10.1)
Chloride: 99 mmol/L (ref 98–107)
Co2: 31 mmol/L (ref 21–32)
Creatinine: 0.83 mg/dL (ref 0.60–1.30)
EGFR (African American): >60
Glucose: 108 mg/dL — ABNORMAL HIGH (ref 65–99)
Osmolality: 269 (ref 275–301)
POTASSIUM: 3.9 mmol/L (ref 3.5–5.1)
Sodium: 135 mmol/L — ABNORMAL LOW (ref 136–145)

## 2013-07-29 LAB — PROTIME-INR
INR: 1.5
Prothrombin Time: 17.8 secs — ABNORMAL HIGH (ref 11.5–14.7)

## 2013-08-15 ENCOUNTER — Ambulatory Visit
Admit: 2013-08-15 | Disposition: A | Discharge status: Home / Self Care | Payer: Self-pay | Attending: Nurse Practitioner | Admitting: Nurse Practitioner

## 2013-08-15 ENCOUNTER — Ambulatory Visit: Payer: Self-pay | Admitting: Internal Medicine

## 2013-08-17 ENCOUNTER — Emergency Department: Payer: Self-pay | Admitting: Emergency Medicine

## 2013-09-30 ENCOUNTER — Emergency Department: Payer: Self-pay | Admitting: Emergency Medicine

## 2013-09-30 LAB — CBC
HCT: 38.9 % — ABNORMAL LOW (ref 40.0–52.0)
HGB: 12.6 g/dL — AB (ref 13.0–18.0)
MCH: 30.2 pg (ref 26.0–34.0)
MCHC: 32.4 g/dL (ref 32.0–36.0)
MCV: 93 fL (ref 80–100)
Platelet: 251 10*3/uL (ref 150–440)
RBC: 4.16 10*6/uL — ABNORMAL LOW (ref 4.40–5.90)
RDW: 16.3 % — ABNORMAL HIGH (ref 11.5–14.5)
WBC: 8.1 10*3/uL (ref 3.8–10.6)

## 2013-09-30 LAB — COMPREHENSIVE METABOLIC PANEL
Albumin: 3 g/dL — ABNORMAL LOW (ref 3.4–5.0)
Alkaline Phosphatase: 58 U/L
Anion Gap: 6 — ABNORMAL LOW (ref 7–16)
BUN: 6 mg/dL — ABNORMAL LOW (ref 7–18)
Bilirubin,Total: 0.2 mg/dL (ref 0.2–1.0)
CALCIUM: 9.4 mg/dL (ref 8.5–10.1)
CREATININE: 0.9 mg/dL (ref 0.60–1.30)
Chloride: 104 mmol/L (ref 98–107)
Co2: 30 mmol/L (ref 21–32)
EGFR (Non-African Amer.): >60
GLUCOSE: 89 mg/dL (ref 65–99)
Osmolality: 276 (ref 275–301)
Potassium: 3.1 mmol/L — ABNORMAL LOW (ref 3.5–5.1)
SGOT(AST): 19 U/L (ref 15–37)
SGPT (ALT): 13 U/L (ref 12–78)
Sodium: 140 mmol/L (ref 136–145)
TOTAL PROTEIN: 8.1 g/dL (ref 6.4–8.2)

## 2013-09-30 LAB — LIPASE, BLOOD: Lipase: 100 U/L (ref 73–393)

## 2013-10-05 ENCOUNTER — Ambulatory Visit: Payer: Self-pay | Admitting: Unknown Physician Specialty

## 2013-10-10 ENCOUNTER — Ambulatory Visit: Payer: Self-pay | Admitting: Unknown Physician Specialty

## 2013-11-23 ENCOUNTER — Emergency Department: Payer: Self-pay | Admitting: Emergency Medicine

## 2013-11-23 LAB — COMPREHENSIVE METABOLIC PANEL
ALT: 12 U/L — AB
ANION GAP: 8 (ref 7–16)
AST: 17 U/L (ref 15–37)
Albumin: 2.9 g/dL — ABNORMAL LOW (ref 3.4–5.0)
Alkaline Phosphatase: 68 U/L
BUN: 5 mg/dL — ABNORMAL LOW (ref 7–18)
Bilirubin,Total: 0.2 mg/dL (ref 0.2–1.0)
CALCIUM: 8.7 mg/dL (ref 8.5–10.1)
CO2: 30 mmol/L (ref 21–32)
CREATININE: 0.95 mg/dL (ref 0.60–1.30)
Chloride: 103 mmol/L (ref 98–107)
EGFR (African American): >60
EGFR (Non-African Amer.): >60
Glucose: 102 mg/dL — ABNORMAL HIGH (ref 65–99)
Osmolality: 279 (ref 275–301)
Potassium: 3.4 mmol/L — ABNORMAL LOW (ref 3.5–5.1)
Sodium: 141 mmol/L (ref 136–145)
TOTAL PROTEIN: 7.6 g/dL (ref 6.4–8.2)

## 2013-11-23 LAB — CBC
HCT: 41.5 % (ref 40.0–52.0)
HGB: 13 g/dL (ref 13.0–18.0)
MCH: 28.2 pg (ref 26.0–34.0)
MCHC: 31.4 g/dL — AB (ref 32.0–36.0)
MCV: 90 fL (ref 80–100)
PLATELETS: 198 10*3/uL (ref 150–440)
RBC: 4.62 10*6/uL (ref 4.40–5.90)
RDW: 15.8 % — ABNORMAL HIGH (ref 11.5–14.5)
WBC: 8.6 10*3/uL (ref 3.8–10.6)

## 2013-11-23 LAB — TROPONIN I

## 2013-11-23 LAB — LIPASE, BLOOD: Lipase: 129 U/L (ref 73–393)

## 2013-11-28 ENCOUNTER — Emergency Department: Payer: Self-pay | Admitting: Emergency Medicine

## 2013-11-28 LAB — CBC
HCT: 40.1 % (ref 40.0–52.0)
HGB: 12.6 g/dL — ABNORMAL LOW (ref 13.0–18.0)
MCH: 28.3 pg (ref 26.0–34.0)
MCHC: 31.3 g/dL — ABNORMAL LOW (ref 32.0–36.0)
MCV: 90 fL (ref 80–100)
PLATELETS: 188 10*3/uL (ref 150–440)
RBC: 4.43 10*6/uL (ref 4.40–5.90)
RDW: 15.8 % — AB (ref 11.5–14.5)
WBC: 7.3 10*3/uL (ref 3.8–10.6)

## 2013-11-28 LAB — COMPREHENSIVE METABOLIC PANEL
ALK PHOS: 63 U/L
ALT: 10 U/L — AB
Albumin: 2.6 g/dL — ABNORMAL LOW (ref 3.4–5.0)
Anion Gap: 7 (ref 7–16)
BUN: 7 mg/dL (ref 7–18)
Bilirubin,Total: 0.1 mg/dL — ABNORMAL LOW (ref 0.2–1.0)
CALCIUM: 8.5 mg/dL (ref 8.5–10.1)
CHLORIDE: 106 mmol/L (ref 98–107)
Co2: 30 mmol/L (ref 21–32)
Creatinine: 1.03 mg/dL (ref 0.60–1.30)
EGFR (Non-African Amer.): >60
GLUCOSE: 107 mg/dL — AB (ref 65–99)
Osmolality: 283 (ref 275–301)
POTASSIUM: 3.7 mmol/L (ref 3.5–5.1)
SGOT(AST): 17 U/L (ref 15–37)
Sodium: 143 mmol/L (ref 136–145)
TOTAL PROTEIN: 7.1 g/dL (ref 6.4–8.2)

## 2013-11-28 LAB — LIPASE, BLOOD: Lipase: 114 U/L (ref 73–393)

## 2013-12-01 ENCOUNTER — Emergency Department: Payer: Self-pay | Admitting: Emergency Medicine

## 2013-12-01 LAB — CBC WITH DIFFERENTIAL/PLATELET
BASOS ABS: 0.1 10*3/uL (ref 0.0–0.1)
Basophil %: 0.6 %
Eosinophil #: 0.2 10*3/uL (ref 0.0–0.7)
Eosinophil %: 2.7 %
HCT: 43 % (ref 40.0–52.0)
HGB: 13.5 g/dL (ref 13.0–18.0)
LYMPHS ABS: 2.1 10*3/uL (ref 1.0–3.6)
Lymphocyte %: 25.3 %
MCH: 28.3 pg (ref 26.0–34.0)
MCHC: 31.5 g/dL — AB (ref 32.0–36.0)
MCV: 90 fL (ref 80–100)
MONOS PCT: 12.7 %
Monocyte #: 1 x10 3/mm (ref 0.2–1.0)
NEUTROS ABS: 4.8 10*3/uL (ref 1.4–6.5)
Neutrophil %: 58.7 %
Platelet: 214 10*3/uL (ref 150–440)
RBC: 4.79 10*6/uL (ref 4.40–5.90)
RDW: 16.4 % — AB (ref 11.5–14.5)
WBC: 8.2 10*3/uL (ref 3.8–10.6)

## 2013-12-01 LAB — COMPREHENSIVE METABOLIC PANEL
ALK PHOS: 67 U/L
ANION GAP: 7 (ref 7–16)
AST: 15 U/L (ref 15–37)
Albumin: 3.2 g/dL — ABNORMAL LOW (ref 3.4–5.0)
BUN: 11 mg/dL (ref 7–18)
Bilirubin,Total: 0.2 mg/dL (ref 0.2–1.0)
CHLORIDE: 101 mmol/L (ref 98–107)
CREATININE: 1.04 mg/dL (ref 0.60–1.30)
Calcium, Total: 9.4 mg/dL (ref 8.5–10.1)
Co2: 31 mmol/L (ref 21–32)
Glucose: 120 mg/dL — ABNORMAL HIGH (ref 65–99)
Osmolality: 278 (ref 275–301)
Potassium: 3.8 mmol/L (ref 3.5–5.1)
SGPT (ALT): 12 U/L — ABNORMAL LOW
SODIUM: 139 mmol/L (ref 136–145)
TOTAL PROTEIN: 8.3 g/dL — AB (ref 6.4–8.2)

## 2013-12-03 LAB — CBC
HCT: 40.8 % (ref 40.0–52.0)
HGB: 13.3 g/dL (ref 13.0–18.0)
MCH: 28.8 pg (ref 26.0–34.0)
MCHC: 32.5 g/dL (ref 32.0–36.0)
MCV: 89 fL (ref 80–100)
PLATELETS: 218 10*3/uL (ref 150–440)
RBC: 4.61 10*6/uL (ref 4.40–5.90)
RDW: 16.5 % — AB (ref 11.5–14.5)
WBC: 9.7 10*3/uL (ref 3.8–10.6)

## 2013-12-03 LAB — COMPREHENSIVE METABOLIC PANEL
ALBUMIN: 3 g/dL — AB (ref 3.4–5.0)
ALK PHOS: 57 U/L
Anion Gap: 9 (ref 7–16)
BUN: 11 mg/dL (ref 7–18)
Bilirubin,Total: 0.1 mg/dL — ABNORMAL LOW (ref 0.2–1.0)
CHLORIDE: 104 mmol/L (ref 98–107)
CREATININE: 1.06 mg/dL (ref 0.60–1.30)
Calcium, Total: 9 mg/dL (ref 8.5–10.1)
Co2: 28 mmol/L (ref 21–32)
EGFR (African American): >60
EGFR (Non-African Amer.): >60
GLUCOSE: 100 mg/dL — AB (ref 65–99)
Osmolality: 281 (ref 275–301)
Potassium: 3.8 mmol/L (ref 3.5–5.1)
SGOT(AST): 22 U/L (ref 15–37)
SGPT (ALT): 16 U/L
Sodium: 141 mmol/L (ref 136–145)
Total Protein: 7.6 g/dL (ref 6.4–8.2)

## 2013-12-03 LAB — TROPONIN I: Troponin-I: <0.02 ng/mL

## 2013-12-04 ENCOUNTER — Observation Stay: Payer: Self-pay | Admitting: Internal Medicine

## 2013-12-04 LAB — URINALYSIS, COMPLETE
Bacteria: NONE SEEN
Bilirubin,UR: NEGATIVE
Blood: NEGATIVE
GLUCOSE, UR: NEGATIVE mg/dL (ref 0–75)
Ketone: NEGATIVE
LEUKOCYTE ESTERASE: NEGATIVE
Nitrite: NEGATIVE
PH: 5 (ref 4.5–8.0)
Protein: NEGATIVE
RBC,UR: <1 /HPF (ref 0–5)
SQUAMOUS EPITHELIAL: NONE SEEN
Specific Gravity: 1.009 (ref 1.003–1.030)
WBC UR: 2 /HPF (ref 0–5)

## 2013-12-04 LAB — LIPASE, BLOOD: LIPASE: 125 U/L (ref 73–393)

## 2013-12-05 LAB — COMPREHENSIVE METABOLIC PANEL
ALT: 19 U/L
ANION GAP: 3 — AB (ref 7–16)
Albumin: 2.7 g/dL — ABNORMAL LOW (ref 3.4–5.0)
Alkaline Phosphatase: 43 U/L — ABNORMAL LOW
BUN: 12 mg/dL (ref 7–18)
Bilirubin,Total: 0.2 mg/dL (ref 0.2–1.0)
CALCIUM: 8.7 mg/dL (ref 8.5–10.1)
CO2: 32 mmol/L (ref 21–32)
Chloride: 104 mmol/L (ref 98–107)
Creatinine: 1.1 mg/dL (ref 0.60–1.30)
EGFR (African American): >60
EGFR (Non-African Amer.): >60
Glucose: 97 mg/dL (ref 65–99)
Osmolality: 277 (ref 275–301)
Potassium: 3.8 mmol/L (ref 3.5–5.1)
SGOT(AST): 18 U/L (ref 15–37)
Sodium: 139 mmol/L (ref 136–145)
Total Protein: 6.7 g/dL (ref 6.4–8.2)

## 2013-12-05 LAB — CBC WITH DIFFERENTIAL/PLATELET
BASOS ABS: 0.1 10*3/uL (ref 0.0–0.1)
Basophil %: 0.9 %
Eosinophil #: 0.3 10*3/uL (ref 0.0–0.7)
Eosinophil %: 3.5 %
HCT: 37.4 % — ABNORMAL LOW (ref 40.0–52.0)
HGB: 11.6 g/dL — AB (ref 13.0–18.0)
Lymphocyte #: 2 10*3/uL (ref 1.0–3.6)
Lymphocyte %: 25.8 %
MCH: 28 pg (ref 26.0–34.0)
MCHC: 31.1 g/dL — AB (ref 32.0–36.0)
MCV: 90 fL (ref 80–100)
MONO ABS: 1 x10 3/mm (ref 0.2–1.0)
MONOS PCT: 12.4 %
NEUTROS ABS: 4.5 10*3/uL (ref 1.4–6.5)
Neutrophil %: 57.4 %
Platelet: 192 10*3/uL (ref 150–440)
RBC: 4.15 10*6/uL — ABNORMAL LOW (ref 4.40–5.90)
RDW: 16.5 % — ABNORMAL HIGH (ref 11.5–14.5)
WBC: 7.9 10*3/uL (ref 3.8–10.6)

## 2013-12-05 LAB — MAGNESIUM: Magnesium: 1.6 mg/dL — ABNORMAL LOW

## 2014-01-05 ENCOUNTER — Emergency Department: Payer: Self-pay | Admitting: Emergency Medicine

## 2014-01-05 LAB — CBC WITH DIFFERENTIAL/PLATELET
BASOS ABS: 0.1 10*3/uL (ref 0.0–0.1)
BASOS PCT: 0.9 %
EOS ABS: 0.3 10*3/uL (ref 0.0–0.7)
EOS PCT: 2.8 %
HCT: 45.1 % (ref 40.0–52.0)
HGB: 14.6 g/dL (ref 13.0–18.0)
Lymphocyte #: 1.8 10*3/uL (ref 1.0–3.6)
Lymphocyte %: 20.7 %
MCH: 28.9 pg (ref 26.0–34.0)
MCHC: 32.3 g/dL (ref 32.0–36.0)
MCV: 90 fL (ref 80–100)
MONOS PCT: 10.6 %
Monocyte #: 0.9 x10 3/mm (ref 0.2–1.0)
Neutrophil #: 5.8 10*3/uL (ref 1.4–6.5)
Neutrophil %: 65 %
Platelet: 218 10*3/uL (ref 150–440)
RBC: 5.03 10*6/uL (ref 4.40–5.90)
RDW: 17.2 % — AB (ref 11.5–14.5)
WBC: 8.9 10*3/uL (ref 3.8–10.6)

## 2014-01-05 LAB — COMPREHENSIVE METABOLIC PANEL
ALBUMIN: 3.4 g/dL (ref 3.4–5.0)
ANION GAP: 6 — AB (ref 7–16)
Alkaline Phosphatase: 78 U/L
BUN: 11 mg/dL (ref 7–18)
Bilirubin,Total: 0.3 mg/dL (ref 0.2–1.0)
CO2: 31 mmol/L (ref 21–32)
Calcium, Total: 8.7 mg/dL (ref 8.5–10.1)
Chloride: 103 mmol/L (ref 98–107)
Creatinine: 1.17 mg/dL (ref 0.60–1.30)
EGFR (African American): >60
EGFR (Non-African Amer.): >60
Glucose: 119 mg/dL — ABNORMAL HIGH (ref 65–99)
OSMOLALITY: 280 (ref 275–301)
POTASSIUM: 4 mmol/L (ref 3.5–5.1)
SGOT(AST): 24 U/L (ref 15–37)
SGPT (ALT): 19 U/L
Sodium: 140 mmol/L (ref 136–145)
Total Protein: 8 g/dL (ref 6.4–8.2)

## 2014-01-05 LAB — LIPASE, BLOOD: Lipase: 147 U/L (ref 73–393)

## 2014-01-13 ENCOUNTER — Emergency Department: Payer: Self-pay | Admitting: Emergency Medicine

## 2014-01-13 LAB — COMPREHENSIVE METABOLIC PANEL
Albumin: 3.8 g/dL (ref 3.4–5.0)
Alkaline Phosphatase: 81 U/L
Anion Gap: 12 (ref 7–16)
BILIRUBIN TOTAL: 0.5 mg/dL (ref 0.2–1.0)
BUN: 15 mg/dL (ref 7–18)
CALCIUM: 9.2 mg/dL (ref 8.5–10.1)
CO2: 24 mmol/L (ref 21–32)
CREATININE: 0.81 mg/dL (ref 0.60–1.30)
Chloride: 105 mmol/L (ref 98–107)
GLUCOSE: 99 mg/dL (ref 65–99)
Potassium: 4 mmol/L (ref 3.5–5.1)
SGOT(AST): 30 U/L (ref 15–37)
SGPT (ALT): 19 U/L
SODIUM: 141 mmol/L (ref 136–145)
Total Protein: 9.2 g/dL — ABNORMAL HIGH (ref 6.4–8.2)

## 2014-01-13 LAB — CBC
HCT: 48.3 % (ref 40.0–52.0)
HGB: 15.7 g/dL (ref 13.0–18.0)
MCH: 29.1 pg (ref 26.0–34.0)
MCHC: 32.5 g/dL (ref 32.0–36.0)
MCV: 90 fL (ref 80–100)
PLATELETS: 200 10*3/uL (ref 150–440)
RBC: 5.39 10*6/uL (ref 4.40–5.90)
RDW: 17.5 % — ABNORMAL HIGH (ref 11.5–14.5)
WBC: 9.8 10*3/uL (ref 3.8–10.6)

## 2014-01-13 LAB — TROPONIN I: Troponin-I: <0.02 ng/mL

## 2014-01-13 LAB — CK TOTAL AND CKMB (NOT AT ARMC): CK, Total: 61 U/L

## 2014-01-13 LAB — LIPASE, BLOOD: LIPASE: 79 U/L (ref 73–393)

## 2014-05-19 ENCOUNTER — Inpatient Hospital Stay: Payer: Self-pay | Admitting: Psychiatry

## 2014-05-27 ENCOUNTER — Emergency Department: Payer: Self-pay | Admitting: Emergency Medicine

## 2014-05-29 ENCOUNTER — Emergency Department: Payer: Self-pay | Admitting: Emergency Medicine

## 2014-05-30 ENCOUNTER — Emergency Department: Payer: Self-pay | Admitting: Emergency Medicine

## 2014-07-08 NOTE — Consult Note (Signed)
Pt had MBS which showed aspiration with failure to sense boluses and poor clearance with cough. Janina Mayorach could be impeding laryngeal elevation, but this is unlikely to explain all of the reasons for poor swallow and aspiration with decreased cough reflex. This makes decision to remove trach more difficult, i.e. it could improve swallowing, though that is unlikely, and with his insistence on an oral diet despite aspiration risk, he could develop pneumonia, which could result in deterioration and reintubation. Will discuss with Dr. Michela PitcherEly and try to make a decision on trach status, as pt continues to pull it out regularly. He is tolerating capping with minimal suction needs from what I can tell talking to nursing, and lungs are essentially clear except mild wheezing.  Electronic Signatures: Sandi MealyBennett, Jimmylee Ratterree S (MD)  (Signed on 13-May-15 11:56)  Authored  Last Updated: 13-May-15 11:56 by Sandi MealyBennett, Brenan Modesto S (MD)

## 2014-07-08 NOTE — Consult Note (Signed)
   Comments   Case discussed with Dr Michela PitcherEly. Will hold on consult at this time. Please reconsult if needed.   Electronic Signatures: Borders, Daryl EasternJoshua R (NP)  (Signed 13-May-15 15:43)  Authored: Palliative Care   Last Updated: 13-May-15 15:43 by Malachy MoanBorders, Joshua R (NP)

## 2014-07-08 NOTE — Consult Note (Signed)
Pt with high fever, tachycardia, hypoxemia very likely bacterial sepsis, pt was on Rocephin and may obscure blood cultures.  Started on vanc and zosyn.  Obvious possible sources include infected vascular access, he has had his PIC line in for about 2 weeks now.  Urinary source possible with indwelling foley for same amt of time.  Mucus plugging possible, CT and CXR results noted.  acalculus cholecystitis possible. there were findings on CT in gall bladder area.  He was on Lovenox but this was stopped for his trach.  Should restart it since he is a set up for DVT/PE.  PEG done and excellent transillumination  which makes impalement of any abd structure extremely unlikely.  ABd exam at this time shows distention and some tinkling to bowel sounds, likely ileus from sepsis.  change venous access lines if possible  Could get HIDA scan If gall bladder lites up doubt cholecystitis UA and C and S but on new antibiotics so results may be misleading. CXR for a few days to see if new infiltrates develop.  IF so bronch for possible plugs. Lovenox?  at prevention dose of 40mg  SQ daily Shelle IronRein is on call this weekend but will not see unless you call for a problem.  Electronic Signatures: Scot JunElliott, Robert T (MD)  (Signed on 10-Apr-15 17:57)  Authored  Last Updated: 10-Apr-15 17:57 by Scot JunElliott, Robert T (MD)

## 2014-07-08 NOTE — Consult Note (Signed)
Pt to be scheduled for PEG with Dr. Michela PitcherEly for Thursday, likely pull through with Endoscopic approach. He had open abd surgery for abcess with appendicitis.  Appears to be tolerating more tube feeding at this time.  Electronic Signatures: Scot JunElliott, Kaja Jackowski T (MD)  (Signed on 07-Apr-15 16:28)  Authored  Last Updated: 07-Apr-15 16:28 by Scot JunElliott, Delawrence Fridman T (MD)

## 2014-07-08 NOTE — Consult Note (Signed)
Chief Complaint:  Subjective/Chief Complaint seen for fu peg placement and melena.  still with dark stool, responsive to examiner.  denies nausea, possible mildl abdominal pain.   VITAL SIGNS/ANCILLARY NOTES: **Vital Signs.:   20-Apr-15 16:00  Vital Signs Type Routine  Temperature Temperature (F) 100.5  Temperature Source oral  Pulse Pulse 116  Pulse source if not from Vital Sign Device per cardiac monitor  Respirations Respirations 22  Systolic BP Systolic BP 975  Diastolic BP (mmHg) Diastolic BP (mmHg) 71  Mean BP 104  BP Source  if not from Vital Sign Device non-invasive  Pulse Ox % Pulse Ox % 100  Pulse Ox Activity Level  At rest  Oxygen Delivery Trach Aerosol Mask  Pulse Ox Heart Rate 116    19:00  Vital Signs Type Routine  Pulse Pulse 110  Respirations Respirations 17  Systolic BP Systolic BP 883  Diastolic BP (mmHg) Diastolic BP (mmHg) 81  Mean BP 106  Pulse Ox % Pulse Ox % 100  Pulse Ox Activity Level  At rest  Oxygen Delivery Trach Aerosol Mask  Pulse Ox Heart Rate 110  *Intake and Output.:   20-Apr-15 06:16  Stool  450 dark liquid stool   Brief Assessment:  Cardiac Regular   Respiratory clear BS  coarse airway noise   Gastrointestinal details normal Bowel sounds normal  protuberant, becoming softer, minimal pain noted on soft palpation   Lab Results: TDMs:  20-Apr-15 05:45   Vancomycin, Trough LAB 17 (Result(s) reported on 04 Jul 2013 at 06:09AM.)  Routine Micro:  20-Apr-15 09:47   Micro Text Report BLOOD CULTURE   COMMENT                   NO GROWTH IN 8-12 HOURS   ANTIBIOTIC                       Culture Comment NO GROWTH IN 8-12 HOURS  Result(s) reported on 04 Jul 2013 at 06:00PM.    11:46   Micro Text Report BLOOD CULTURE   COMMENT                   NO GROWTH IN 8-12 HOURS   ANTIBIOTIC                       Specimen Source r-arm  Culture Comment NO GROWTH IN 8-12 HOURS  Result(s) reported on 04 Jul 2013 at 07:00PM.  Routine Chem:   20-Apr-15 05:45   Glucose, Serum  176  BUN  53  Creatinine (comp)  2.87  Sodium, Serum  151  Potassium, Serum  3.0  Chloride, Serum  114  CO2, Serum  33  Calcium (Total), Serum  7.6  Anion Gap  4  Osmolality (calc) 319  eGFR (African American)  28  eGFR (Non-African American)  24 (eGFR values <37m/min/1.73 m2 may be an indication of chronic kidney disease (CKD). Calculated eGFR is useful in patients with stable renal function. The eGFR calculation will not be reliable in acutely ill patients when serum creatinine is changing rapidly. It is not useful in  patients on dialysis. The eGFR calculation may not be applicable to patients at the low and high extremes of body sizes, pregnant women, and vegetarians.)  Magnesium, Serum 2.3 (1.8-2.4 THERAPEUTIC RANGE: 4-7 mg/dL TOXIC: > 10 mg/dL  -----------------------)  Phosphorus, Serum 4.2 (Result(s) reported on 04 Jul 2013 at 06:11AM.)    11:46   Glucose, Serum  160  BUN  47  Creatinine (comp)  2.93  Sodium, Serum  154  Potassium, Serum 3.5  Chloride, Serum  115  CO2, Serum  34  Calcium (Total), Serum  7.8  Anion Gap  5  Osmolality (calc) 321  eGFR (African American)  28  eGFR (Non-African American)  24 (eGFR values <36m/min/1.73 m2 may be an indication of chronic kidney disease (CKD). Calculated eGFR is useful in patients with stable renal function. The eGFR calculation will not be reliable in acutely ill patients when serum creatinine is changing rapidly. It is not useful in  patients on dialysis. The eGFR calculation may not be applicable to patients at the low and high extremes of body sizes, pregnant women, and vegetarians.)    18:28   Glucose, Serum  155  BUN  47  Creatinine (comp)  2.78  Sodium, Serum  152  Potassium, Serum  3.4  Chloride, Serum  115  CO2, Serum  36  Calcium (Total), Serum  7.7  Anion Gap  1  Osmolality (calc) 317  eGFR (African American)  29  eGFR (Non-African American)  25 (eGFR values  <670mmin/1.73 m2 may be an indication of chronic kidney disease (CKD). Calculated eGFR is useful in patients with stable renal function. The eGFR calculation will not be reliable in acutely ill patients when serum creatinine is changing rapidly. It is not useful in  patients on dialysis. The eGFR calculation may not be applicable to patients at the low and high extremes of body sizes, pregnant women, and vegetarians.)  Routine Hem:  20-Apr-15 05:45   WBC (CBC) 6.5  RBC (CBC)  2.56  Hemoglobin (CBC)  7.7  Hematocrit (CBC)  24.7  Platelet Count (CBC) 152  MCV 96  MCH 30.1  MCHC  31.2  RDW  18.8  Neutrophil % 76.0  Lymphocyte % 13.7  Monocyte % 5.5  Eosinophil % 3.8  Basophil % 1.0  Neutrophil # 4.9  Lymphocyte #  0.9  Monocyte # 0.4  Eosinophil # 0.2  Basophil # 0.1 (Result(s) reported on 04 Jul 2013 at 05:59AM.)   Assessment/Plan:  Assessment/Plan:  Assessment 1) acute appendicitis and intraabdominal abscess, s/p open appy, subsequent PEG placement with passage of melena, stable hgb currently.   Plan 1) continue ppi, no new recs, Dr ElVira Agaro resume GI care tomorrow.   Electronic Signatures: SkLoistine SimasMD)  (Signed 20-Apr-15 19:50)  Authored: Chief Complaint, VITAL SIGNS/ANCILLARY NOTES, Brief Assessment, Lab Results, Assessment/Plan   Last Updated: 20-Apr-15 19:50 by SkLoistine SimasMD)

## 2014-07-08 NOTE — Discharge Summary (Signed)
PATIENT NAME:  [name], Justin L MR#:  [id number] DATE OF BIRTH:  1966-01-23  DATE OF ADMISSION:  06/03/2013 DATE OF DISCHARGE:  07/29/2013   PRINCIPAL DIAGNOSIS: Gangrenous appendicitis with peritoneal abscess.   OTHER DIAGNOSES: 1.  Ventilatory-dependent respiratory failure.  2.  Methicillin-resistant Staphylococcus aureus pneumonia.  3.  Clostridium difficile colitis.  4.  Fungal sepsis/candidemia.  5.  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  6.  History of depression/anxiety, alcohol and drug abuse.  7.  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  PRINCIPAL PROCEDURE PERFORMED DURING THIS ADMISSION: On June 03, 2013, open appendectomy.   OTHER PROCEDURES: On 06/08/2013, delayed primary closure of abdominal wound. On 06/21/2013, tracheostomy. On 06/23/13, percutaneous endoscopic gastrostomy tube placement.   HOSPITAL COURSE: The patient underwent the above-mentioned procedure on presentation for the above-mentioned diagnosis, and he had a very long complicated hospital course that included all of the above-mentioned diagnoses. 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 He refused tube feedings through his gastrostomy tube and insisted that his tracheostomy be removed. He was quite aware that he had aspirated on a previous barium swallow and that he could choke, aspirate, develop pneumonia, and/or die if he ate, but he insisted on eating anyway. Dr. Cherylann RatelLateef and I had a very frank discussion with the patient regarding his possible diabetes insipidus and how he may well die from that, whether in our hospital facility or in a nursing facility later, but because of his insistence on removing his tracheostomy and stopping his gastrostomy tube feedings  and the fact that he really did not like intravenous devices or subcutaneous injections, his tracheostomy was discontinued and his gastrostomy tube feedings were discontinued. He had a very good understanding of the implications of these technical supports being removed and also understood that he may aspirate and die. He did not wish to facilitate or actively pursue death, but he was thoroughly disgusted with his current life "quality." His blood sugars were under reasonable control and therefore, I discontinued his insulin, his Lovenox, and consulted palliative care to assist in comfort measures and when Dr. Harvie JuniorPhifer saw him, she intended on pursuing a MOST form but he began to waffle, particularly regarding readmission to the hospital. Discharge to [facility] was discussed with him as well as hospice home, and he chose to [facility]. He did this primarily because he realized that he could not continue his DDAVP at the hospice home and wished to have his diabetes insipidus treated with DDAVP nasal inhalations. The vast majority of his medical problems have been adequately controlled and/or resolved during his hospitalization and at the time of his discharge, he was receiving 500 mL of free water through the gastrostomy tube every 6 hours, and he was also having tracheostomy care with a moistened towel over top of his tracheostomy.   MEDICATIONS: Fluoxetine 40 mg daily, gabapentin 300 mg b.i.d., buspirone 15 mg t.i.d., Bactrim double-strength 1 tablet q.12 h., pantoprazole 40 mg daily, potassium chloride 20 mEq oral powder one packet every 6 hours, iron sulfate 220 mg b.i.d. with meals, vancomycin 250 mg per 5 mL solution 2.5 mL p.o. q.6 h., nystatin 100,000 units per gram topical powder t.i.d., diltiazem 30 mg q.6 h., metoprolol 25 mg 3 tablets q.12  h., DDAVP 2 nasal sprays every 8 hours, Coumadin 8 mg q.p.m., Tylenol 650 mg q.6 h. p.r.n. pain or fever.   The patient was  given an appointment with Dr. Egbert Garibaldi in our office at his first available appointment, but was not thought to have any active surgical problems. The skilled nursing facility was aware that currently the patient was undecided as to whether he would seek acute medical care in the hospital should he require that from the skilled nursing facility in the future or not.   The patient, all of the primary physicians, the case manager, and the patient's mother are all aware of the situation.   ____________________________ Claude Manges, MD wfm:jcm D: 07/29/2013 15:04:07 ET T: 07/29/2013 16:03:56 ET JOB#: [id number]  cc: Claude Manges, MD, <Dictator> [facility] Claude Manges MD ELECTRONICALLY SIGNED 07/29/2013 17:59

## 2014-07-08 NOTE — Consult Note (Signed)
CC: inability to take in oral substance or meds.  EGD done showing good place for PEG placement, some esophagitis seen.  Duodenum normal.  PEG done by Dr. Michela PitcherEly with endoscopic assistance using pull through technique with scope reinsertion to check position of button.  May use for meds today and start tube feeding tomorrrow at 20cc/hr and advance slowly.  Electronic Signatures: Scot JunElliott, Canesha Tesfaye T (MD)  (Signed on 09-Apr-15 15:57)  Authored  Last Updated: 09-Apr-15 15:57 by Scot JunElliott, Kawanda Drumheller T (MD)

## 2014-07-08 NOTE — Consult Note (Signed)
PATIENT NAME:  [name], [name] MR#:  [id number] DATE OF BIRTH:  07/05/1965  DATE OF CONSULTATION:  06/16/2013   CONSULTING PHYSICIAN:  Justin BarefootJ. Madison Romain Erion, MD  REASON FOR CONSULTATION: Failed extubation (twice), consideration for tracheostomy.   HISTORY OF PRESENT ILLNESS: The patient was taken from the Emergency Room to the operating room on the 20th of March with abdominal pain. He was converted from a laparoscopic appendectomy to an open laparotomy with profuse intraabdominal pus secondary to a perforated appendix. He has been intubated since that time and attempts at extubation have failed most recently this morning. He was noted this afternoon to have approximately 500 mL of bilious fluid , which was removed with suctioning by [name].   ALLERGIES: None.   MEDICATIONS: Propofol drip, fentanyl drip, insulin drip, subcutaneous Lovenox, IV ceftriaxone, IV hydromorphone, IV lorazepam, IV ondansetron. IV pantoprazole, albuterol p.r.n., furosemide.   PAST SURGICAL HISTORY: Laparotomy 06/03/2013.   PAST MEDICAL HISTORY: Hypertension, depression and anxiety disorder.   PHYSICAL EXAMINATION:  GENERAL: The patient is intubated, sedated, multiple drips, unresponsive to verbal direction. ORAL CAVITY AND OROPHARYNX:  Endotracheal tube in position, no floor of mouth masses. Tongue is soft.  NECK: Trachea is midline. There is no high riding innominate artery. No significant cervical lymphadenopathy.   IMPRESSION: Critically ill patient secondary to ruptured appendicitis, status post laparotomy on March 20th, status post failed extubation x 2. The recommendation from the critical care team is consideration for tracheostomy and PEG tube placement. I have discussed this with the patient's nurse and timing on this would be optimally sometime in the early part of next week to allow for further stabilization. I will arrange this through  ENT (I am going to be out of town for all week next week) and we  will make sure that the hand off is handled appropriately. I will check with the operating room to see what availability is for Tuesday or Wednesday of next week.    ____________________________ J. Gertie BaronMadison Teniola Tseng, MD jmc:tc D: 06/16/2013 17:16:11 ET T: 06/16/2013 17:50:01 ET JOB#: [id number]  cc: Justin BarefootJ. Madison Shadee Montoya, MD, <Dictator>             [name] Pconja Thompson - Practice Administrator Wendee CoppJMADISON Lanson Randle MD ELECTRONICALLY SIGNED 07/04/2013 21:08

## 2014-07-08 NOTE — Consult Note (Signed)
No further melena per nurse.  Pt waking up some, responds to commands better. tongue pink, arms edematous, as is scrotum.  He is near target volume via tube feeding.  Abd distended but bowel sounds present.  If he continues to drop his hgb or have black stools may need EGD but will hold for now, continue Protonix 40mg  bid. iv push.  Dr. Marva PandaSkulskie on this weekend but will not see unless you call about a problem.  Electronic Signatures: Scot JunElliott, Jynesis Nakamura T (MD)  (Signed on 16-Apr-15 11:38)  Authored  Last Updated: 16-Apr-15 11:38 by Scot JunElliott, Creasie Lacosse T (MD)

## 2014-07-08 NOTE — Op Note (Signed)
PATIENT NAME:  [name], Justin L MR#:  [id number] DATE OF BIRTH:  09-Oct-1965  DATE OF PROCEDURE:  06/23/2013  PREOPERATIVE DIAGNOSES: Ruptured appendicitis with respiratory insufficiency, failure to thrive.   POSTOPERATIVE DIAGNOSES:  Ruptured appendicitis with respiratory insufficiency, failure to thrive.    PROCEDURE PERFORMED: Percutaneous endoscopic gastrostomy.  ENDOSCOPIST:  Dr. Mechele CollinElliott.   SURGEON: Quentin Orealph L. Ely anesthesia.  ANESTHESIA:  General.   OPERATIVE PROCEDURE: With the patient in the supine position after the induction of appropriate relaxation and general sedation, the patient's abdomen was inspected for transillumination. The EGD procedure is dictated separately by Dr. Kerrin MoBob Elliott. Once the stomach was identified, the area was infiltrated with 1% Xylocaine. A wire was passed into the stomach under direct vision, grasped with the endoscopic snare, and pulled out through the mouth. It was then attached to the tube and brought back through the mouth and out the abdominal wall. The incision was slightly enlarged. The tube appeared to be in good position and was photographed. It was then secured in place appropriately shortened and a cap positioned.  Sterile dressing was applied.    ____________________________ Quentin Orealph L. Ely III, MD rle:tc D: 06/23/2013 14:21:07 ET T: 06/23/2013 14:34:54 ET JOB#: [id number]  cc: Quentin Orealph L. Ely III, MD, <Dictator> Quentin OreALPH L ELY MD ELECTRONICALLY SIGNED 06/23/2013 17:11

## 2014-07-08 NOTE — Consult Note (Signed)
PATIENT NAME:  [name], [name] MR#:  [id number] DATE OF BIRTH:  [birth year]  DATE OF CONSULTATION:  07/03/2013  REFERRING PHYSICIAN:  Internal Medicine hospitalists CONSULTING PHYSICIAN:  Weston SettleShervin Square Jowett, MD  REASON FOR CONSULTATION: Questionable stroke.   HISTORY OF PRESENT ILLNESS: The patient is a 49 year old white male who was initially admitted on June 03, 2013, after he developed a ruptured appendix. He was taken emergently to open appendectomy, at which time he was found to have perforation and abscess formation. Postoperatively, he had failure to wean off the ventilator, requiring eventually for a tracheostomy and PEG tube to be placed. He did become septic with blood cultures showing positive  Candida and sputum cultures showing positive Candida as well as gram-positive cocci in pairs. He has been on antibiotics since. Latest labs show sodium 153, potassium 3.6, chloride 115, bicarbonate 34, BUN 58, creatinine 3.55 with a GFR of 20, glucose 109, calcium 7.7, magnesium 2.2, alkaline phosphatase 301, total bilirubin 1.5, albumin is 1.2. CBC is normal except for hemoglobin of 7.5 with an MCV of 96. He is fecal blood positive and there is a suspicion that he has had some sort of GI bleed as well. CT of the abdomen and pelvis most recently is normal. Due to being off all sedation except oral benzodiazepines and reduced mental alertness, the patient had a CT scan of the brain which I reviewed personally. There is no evidence of acute hemorrhage. There is some periventricular small vessel ischemic disease. I do not see any hypodensities suggestive of a new acute infarct. The radiologist does comment on possible left frontal infarct but I am not convinced that that is present at all. He has remained febrile with a T-maximum of 103 degrees Fahrenheit at 3:00 a.m. this morning.   PAST MEDICAL HISTORY: 1.  Appendicitis.  2.  Depression and anxiety.  3.  Alcohol and drug abuse.   PAST SURGICAL HISTORY:  Appendectomy.   CURRENT MEDICATIONS IN THE HOSPITAL:  1.   Lovenox 40 mg subcutaneously daily.  2.  BuSpar 50 mg t.i.d.  3.  Prozac 40 mg daily.  4.  Gabapentin 300 mg b.i.d.  5.  Seroquel 50 mg b.i.d.  6.  Xanax 1 mg t.i.d.  7.  Micafungin 100 mg IV daily.  8.  Unasyn 3 grams IV q.12 hours.  9.  Vancomycin 1250 mg IV q. 36 hours.   ALLERGIES: No known drug allergies.   SOCIAL HISTORY: He does have a history of smoking, alcohol and illicit drug use in the past. He was living with his girlfriend, who is not present right now but does have some potential  mental retardation per nurse.   FAMILY HISTORY: Noncontributory.   REVIEW OF SYSTEMS: Unobtainable because of the patient's mental status.   PHYSICAL EXAMINATION: VITAL SIGNS: Blood pressure is 157/99, pulse of 96, temperature 98 degrees Fahrenheit. He was 103 degrees Fahrenheit at 3:00 a.m.  GENERAL:  He has a tracheostomy to the ventilator and is breathing over the ventilator. G-tube is in place. He does open his eyes and makes good eye contact to verbal and tactile stimulation.  HEENT:  Pupils are small but reactive and equal. Extraocular movements are intact. Face appears to be symmetrical.  NEUROLOGICAL:  He does mouth words and does nod his head appropriately to questioning and commands. He follows all commands. Tongue is midline. There is no lacerations on the tongue. He does have generalized weakness but does withdraw to pain and grimace to pain appropriately in  all 4 extremities. He does hold up 2 fingers with the right hand and is able to squeeze with both hands equally. There is no Babinski sign present. There is no Hoffman sign present. Coordination and sensory testing could not be performed accurately and gait testing  had to be deferred.   IMPRESSION AND PLAN: I do not see any neurological evidence on his examination to suggest an acute infarct. I think his mental status is rather appropriate considering the circumstances of  Candida and possibly bacteria in his blood causing a septic type of encephalopathy. He also has renal insufficiency and definitely has a component of renal metabolic encephalopathy as well. I am not convinced of any new acute infarct on CT scan that the radiologist has spoken about and I suspect that that is probably artifact. A repeat CT scan can be performed 24 hours from the prior one to see if that confirms a more obvious hypodensity in the left frontal lobe. In the meantime, I think Xanax should be stopped in order to reduce his sedation and improve his level of alertness. An EEG should be performed tomorrow to assess his cortical function and that will tell us a lot of about his generalized brain function and potential prognosis, which I suspect will be good if he recovers from this fungal and bacterial sepsis. The patient does not need any antiplatelet therapy acutely right now or even anticoagulation and, therefore, that can be withheld based on my review of the CT scan and his neurological examination.   ____________________________ Weston Settle, MD se:cs D: 07/03/2013 11:24:04 ET T: 07/03/2013 14:54:43 ET JOB#: [id number]  cc: Weston Settle, MD, <Dictator> Weston Settle MD ELECTRONICALLY SIGNED 07/20/2013 18:43

## 2014-07-08 NOTE — Consult Note (Signed)
Hgb stable, tube feedings going ok.  No further bleeding.  No new suggestions. Abd exam today shows decreased bowel sounds. mild - mod distention.  I will sign off, reconsult if needed.  Electronic Signatures: Scot JunElliott, Deondrick Searls T (MD)  (Signed on 21-Apr-15 17:40)  Authored  Last Updated: 21-Apr-15 17:40 by Scot JunElliott, Hyland Mollenkopf T (MD)

## 2014-07-08 NOTE — Consult Note (Signed)
Chief Complaint:  Subjective/Chief Complaint sedated on vent.  overall no change. no bm over 36 hours.   VITAL SIGNS/ANCILLARY NOTES: **Vital Signs.:   18-Apr-15 09:00  Vital Signs Type Routine  Temperature Temperature (F) 101  Celsius 38.3  Temperature Source axillary  Pulse Pulse 128  Pulse source if not from Vital Sign Device per cardiac monitor  Respirations Respirations 27  Systolic BP Systolic BP 832  Diastolic BP (mmHg) Diastolic BP (mmHg) 52  Mean BP 87  Pulse Ox % Pulse Ox % 94  Oxygen Delivery Ventilator Assisted  Pulse Ox Heart Rate 128   Brief Assessment:  Cardiac Regular  tachycardia   Respiratory coarse bilateral breath sounds.   Gastrointestinal details normal distended, perhaps a little softer from yesterday.  bs poor distant.   Lab Results: Hepatic:  18-Apr-15 04:21   Bilirubin, Total  1.5  Alkaline Phosphatase  301 (45-117 NOTE: New Reference Range 02/04/13)  SGPT (ALT) 24  SGOT (AST) 29  Total Protein, Serum 6.4  Albumin, Serum  1.2  Routine Chem:  18-Apr-15 04:21   Magnesium, Serum 2.2 (1.8-2.4 THERAPEUTIC RANGE: 4-7 mg/dL TOXIC: > 10 mg/dL  -----------------------)  Phosphorus, Serum  6.1 (Result(s) reported on 02 Jul 2013 at 04:49AM.)  Result Comment LABS - This specimen was collected through an   - indwelling catheter or arterial line.  - A minimum of 37ms of blood was wasted prior    - to collecting the sample.  Interpret  - results with caution.  Result(s) reported on 02 Jul 2013 at 04:37AM.  Glucose, Serum  138  BUN  68  Creatinine (comp)  3.73  Sodium, Serum  152  Potassium, Serum 3.9  Chloride, Serum  112  CO2, Serum 31  Calcium (Total), Serum  7.8  Osmolality (calc) 324  eGFR (African American)  21  eGFR (Non-African American)  18 (eGFR values <640mmin/1.73 m2 may be an indication of chronic kidney disease (CKD). Calculated eGFR is useful in patients with stable renal function. The eGFR calculation will not be reliable in  acutely ill patients when serum creatinine is changing rapidly. It is not useful in  patients on dialysis. The eGFR calculation may not be applicable to patients at the low and high extremes of body sizes, pregnant women, and vegetarians.)  Anion Gap 9  Misc Urine Chem:  18-Apr-15 06:07   Creatinine, Urine  26.7  Protein, Random Urine  66  Protein/Creat Ratio (comp)  2472 (Result(s) reported on 02 Jul 2013 at 08:57AM.)  Routine Hem:  18-Apr-15 04:21   Hemoglobin (CBC)  7.8  WBC (CBC) 7.1  RBC (CBC)  2.48  Hematocrit (CBC)  24.0  Platelet Count (CBC)  114  MCV 97  MCH 31.5  MCHC 32.5  RDW  19.5  Neutrophil % 76.0  Lymphocyte % 14.1  Monocyte % 7.1  Eosinophil % 2.1  Basophil % 0.7  Neutrophil # 5.4  Lymphocyte # 1.0  Monocyte # 0.5  Eosinophil # 0.2  Basophil # 0.1    14:24   Hemoglobin (CBC)  7.7 (Result(s) reported on 02 Jul 2013 at 02:41PM.)   Radiology Results: XRay:    17-Apr-15 20:07, Abdomen Flat and Erect  Abdomen Flat and Erect   REASON FOR EXAM:    abdominal distention, may be cross table lateral   instead of erect.  COMMENTS:       PROCEDURE: DXR - DXR ABDOMEN 2 V FLAT AND ERECT  - Jul 01 2013  8:07PM  CLINICAL DATA:  Abdominal distention    EXAM:  ABDOMEN - 2 VIEW    COMPARISON:  CT abdomen and pelvis June 24, 2013    FINDINGS:  Supine and upright abdomen images were obtained. There is a  gastrostomy catheter either region the stomach. There is a loop of  dilated small bowel in the left mid abdomen. Contrast is seen distal  to this area. The remainder the small bowel appears normal. There is  no free air. There are no abnormal calcifications appreciable.     IMPRESSION:  There is a loop of dilated small bowel in the left abdomen. Question  early ileus or enteritis. Obstruction is felt to be less likely. No  free air. Gastrostomy positioned in region of stomach.      Electronically Signed    By: Lowella Grip M.D.    On: 07/01/2013  20:10     Verified By: Leafy Kindle. WOODRUFF, M.D.,  CT:    18-Apr-15 12:00, CT Head Without Contrast  CT Head Without Contrast   REASON FOR EXAM:    altered mental status  COMMENTS:       PROCEDURE: CT  - CT HEAD WITHOUT CONTRAST  - Jul 02 2013 12:00PM     CLINICAL DATA:  Altered mental status    EXAM:  CT HEAD WITHOUT CONTRAST    TECHNIQUE:  Contiguous axial images were obtained from the base of the skull  through the vertex without intravenous contrast.    COMPARISON:  None.  FINDINGS:  There is mild diffuse atrophy. There is no appreciable mass,  hemorrhage, extra-axial fluid collection, or midline shift. There is  a subtle area of decreased attenuation in the left frontal lobe  which is ill-defined and concerning for an acute infarct. Elsewhere  gray-white compartments appear normal. Bony calvarium appears  intact. The mastoid air cells are hypoplastic. There is  opacification of several mastoid air cells on the left. Mastoids on  the right are clear. A probe is noted in the external auditory canal  on the right.     IMPRESSION:  Findings suspicious for acute infarct in the left frontal lobe. No  hemorrhage. There is mild underlying atrophy. 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  Electronically Signed    By: Lowella Grip M.D.    On: 07/02/2013 14:01         Verified By: Leafy Kindle. WOODRUFF, M.D.,   Assessment/Plan:  Assessment/Plan:  Assessment 1) s/p open appendectomy for perforated appendix and abscess 2) gi bleed with melena s/p PEG placement-no recurrent over 36 hours.  3) early ileus/ abnormal abd xrays with isolated sb dilation 4) ct head today c/w acute CVA   Plan 1) continue ppi, awaiting results of abd ct. discussed with Dr Earleen Newport.   Electronic Signatures: Loistine Simas (MD)  (Signed 18-Apr-15 15:11)  Authored: Chief Complaint, VITAL SIGNS/ANCILLARY NOTES, Brief Assessment, Lab Results, Radiology Results, Assessment/Plan   Last Updated:  18-Apr-15 15:11 by Loistine Simas (MD)

## 2014-07-08 NOTE — Consult Note (Signed)
PATIENT NAME:  [name], [name] MR#:  [id number] DATE OF BIRTH:  Apr 03, 1965  DATE OF CONSULTATION:  06/29/2013  REFERRING PHYSICIAN:  Quentin Ore III, MD, of Surgery. CONSULTING PHYSICIAN:  Aidynn Krenn R. Thor Nannini, MD  REASON FOR CONSULTATION: Multiple medical problems, including acute renal failure, anemia.   HISTORY OF PRESENTING ILLNESS: A 49 year old male patient with history of depression, anxiety, who presented to the hospital with 2 weeks of abdominal pain. The patient was found to have a ruptured appendix. Was taken to surgery immediately and was found to have 300 mL of pus. This was on March 20th. Later had delayed primary closure of the abdominal wound March 25th. The patient was intubated, on broad-spectrum antibiotics. He was unable to be weaned off ventilator, had trach placed. Later had a PEG tube placed.   Presently, the patient continues to spike fevers on and off. He is tolerating his tube feeds. Blood cultures drawn on 06/24/2013 are positive for Candida albicans. Repeat blood cultures from 06/28/2013 are negative at this point.   The patient has not had any frank bleeding.   His vancomycin levels on 06/26/2013 were 32. His creatinine overall has been less than 1, but over the last 2 days has worsened to 2.32.   Urine output 600 over the last 12 hours.   Presently, the patient is awake with the trach, but is unable to communicate. Does follow commands.   PAST MEDICAL HISTORY: Depression, anxiety.   FAMILY HISTORY: Has been reviewed, but unknown.   PAST SURGICAL HISTORY: As described above.   SOCIAL HISTORY: Per old records, the patient does drink heavy alcohol. Is on disability. Unknown if he smokes.  REVIEW OF SYSTEMS: Unobtainable from the patient due to encephalopathy.   ALLERGIES: No known drug allergies.   PRESENT MEDICATIONS:  1. Precedex drip.  2. Morphine 2 to 4 mg IV q.2 p.r.n.  3. Flovent HFA 220 mcg b.i.d.  4. Lorazepam 1 to 2 mg IV q.2 p.r.n.  5. Fluconazole  600 mg IV daily.  6. Pepcid 20 mg b.i.d. through PEG tube.   PHYSICAL EXAMINATION:  VITAL SIGNS: Temperature 100.4 pulse of 124, blood pressure 171/82, saturating 93% on trach vent.  GENERAL: Obese Caucasian male patient lying in bed, confused, but follows commands. HEENT: Atraumatic and normocephalic. Oral mucosa dry and pink. No oral thrush. Shows trach in place.  CARDIOVASCULAR: S1, S2, tachycardic, without any murmurs. Has 2+ edema.  RESPIRATORY: Bilateral coarse breath sounds.  GASTROINTESTINAL: Soft, but distended. Has well-healing scar. Seems to have minimal purulent discharge at the top of the scar, but seems dry. Bowel sounds present.  GENITOURINARY: Has a Foley catheter.  NEUROLOGICAL: Unable to move his legs, but squeezes my hands.   LABORATORY STUDIES: Show: Glucose 147, BUN 40, creatinine 2.32, sodium 143, potassium 3.1 with chloride 108.  WBC 10, hemoglobin 6.9, platelets of 102.  Blood cultures from 06/24/2013 have Candida albicans, and repeat blood cultures from 06/28/2013 are negative.  CT scan of the abdomen and chest with contrast done on 06/24/2013 showed an improving abscess.   ASSESSMENT AND PLAN:  1. Ruptured appendix, status post surgery with abdominal abscess and Candida albicans bacteremia. The patient is presently on fluconazole. Has been seen by ID, Dr. Sampson Goon. Would continue antibiotics as per ID recommendations. As the patient continues to spike fevers in spite of fluconazole, can consider starting the patient on broad-spectrum antibiotics with Zosyn or meropenem tomorrow if he continues to have fevers.  2. Acute renal failure, likely acute  tubular necrosis from his sepsis and ongoing infection, but the patient did receive contrast on 06/24/2013. Also, his vancomycin levels were elevated at 32, presently off vancomycin. Will start him on IV fluids and monitor I's and O's. If this is worse, will need nephrology consultation.  3. Acute blood loss anemia and  anemia of chronic disease. Will transfuse a unit of blood as his hemoglobin is at 6.9 and he is tachycardic.  4. Acute encephalopathy secondary to acute infection. The patient is on Precedex.  5. Vent-dependent respiratory failure. Discussed with Dr. Belia HemanKasa, who is following the patient.  6. Deep vein thrombosis prophylaxis. Lovenox held secondary to worsening anemia.   Time spent today on this critically ill patient was 40 minutes of critical care time.   ____________________________ Molinda BailiffSrikar R. Lai Hendriks, MD srs:lb D: 06/29/2013 15:29:52 ET T: 06/29/2013 15:48:12 ET JOB#: [id number]  cc: Wardell HeathSrikar R. Taurus Alamo, MD, <Dictator> Orie FishermanSRIKAR R Trayveon Beckford MD ELECTRONICALLY SIGNED 07/01/2013 12:52

## 2014-07-08 NOTE — Consult Note (Signed)
Postop day one. [name] site looks fine, some blood tinged d/c but no significant bleeding. Sutures can be removed in 1 week.  Electronic Signatures: Sandi MealyBennett, Sreenidhi Ganson S (MD)  (Signed on 08-Apr-15 11:28)  Authored  Last Updated: 08-Apr-15 11:28 by Sandi MealyBennett, Treanna Dumler S (MD)

## 2014-07-08 NOTE — Consult Note (Signed)
Chief Complaint:  Subjective/Chief Complaint seen for follow up PEG, .  patient with melena on 4/15, peg placed 4/9, but has had few bm since admission for appendicitis.  trach/vent sedated.  last bm 2 am (18 hrs) tachycardia,   VITAL SIGNS/ANCILLARY NOTES: **Vital Signs.:   17-Apr-15 11:45  Vital Signs Type 15 min Post Blood Start Time  Temperature Temperature (F) 98.6  Celsius 37  Temperature Source axillary  Pulse Pulse 111  Pulse source if not from Vital Sign Device per cardiac monitor  Respirations Respirations 25  Systolic BP Systolic BP 099  Diastolic BP (mmHg) Diastolic BP (mmHg) 74  Mean BP 95  BP Source  if not from Vital Sign Device non-invasive  Pulse Ox % Pulse Ox % 96  Oxygen Delivery Ventilator Assisted    18:00  Vital Signs Type Routine  Pulse Pulse 136  Pulse source if not from Vital Sign Device per cardiac monitor  Respirations Respirations 20  Systolic BP Systolic BP 833  Diastolic BP (mmHg) Diastolic BP (mmHg) 68  Mean BP 100  Pulse Ox % Pulse Ox % 92  Oxygen Delivery Ventilator Assisted  Pulse Ox Heart Rate 136  *Intake and Output.:   17-Apr-15 00:00  Grand Totals Intake:  224.9 Output:  1000    Net:  -775.1 24 Hr.:  -457.7    01:00  Grand Totals Intake:  124.9 Output:  450    Net:  -325.1 24 Hr.:  -782.8    02:00  Grand Totals Intake:  124.9 Output:      Net:  124.9 24 Hr.:  -657.9  Stool  soft black stool moderate in size    03:00  Grand Totals Intake:  124.9 Output:      Net:  124.9 24 Hr.:  -533    04:00  Grand Totals Intake:  124.9 Output:  550    Net:  -425.1 24 Hr.:  -958.1    05:00  Grand Totals Intake:  124.9 Output:      Net:  124.9 24 Hr.:  -833.2    06:00  Grand Totals Intake:  224.9 Output:      Net:  224.9 24 Hr.:  -608.3    07:00  Grand Totals Intake:  174.9 Output:      Net:  174.9 24 Hr.:  -433.4    Shift 07:00  Grand Totals Intake:  1249.2 Output:  2000    Net:  -750.8 24 Hr.:  -433.4    Daily 07:00   Grand Totals Intake:  3986.6 Output:  4420    Net:  -433.4 24 Hr.:  -433.4    08:00  Grand Totals Intake:  174.9 Output:      Net:  174.9 24 Hr.:  174.9    09:00  Grand Totals Intake:  174.9 Output:      Net:  174.9 24 Hr.:  349.8    10:00  Grand Totals Intake:  174.9 Output:      Net:  174.9 24 Hr.:  524.7    11:00  Grand Totals Intake:  76.9 Output:      Net:  76.9 24 Hr.:  601.6    11:30  Grand Totals Intake:  125 Output:      Net:  125 24 Hr.:  726.6    12:00  Grand Totals Intake:  201.9 Output:      Net:  201.9 24 Hr.:  928.5    12:51  Grand Totals Intake:   Output:  1450  Net:  -4709 62 Hr.:  -521.5    13:00  Grand Totals Intake:  175 Output:      Net:  [place].:  -346.5    14:00  Grand Totals Intake:  175 Output:      Net:  175 24 Hr.:  -171.5    15:00  Grand Totals Intake:  50 Output:      Net:  110 24 Hr.:  -121.5    Shift 15:00  Grand Totals Intake:  1328.5 Output:  1450    Net:  -121.5 24 Hr.:  -121.5    16:00  Grand Totals Intake:  50 Output:      Net:  22 24 Hr.:  -71.5    16:14  Grand Totals Intake:   Output:  950    Net:  -[place].:  -1021.5    17:00  Grand Totals Intake:  50 Output:      Net:  35 24 Hr.:  -971.5    18:00  Grand Totals Intake:  148 Output:      Net:  148 24 Hr.:  -823.5    Shift 23:00  Grand Totals Intake:  248 Output:  950    Net:  -836 62 Hr.:  -823.5   Brief Assessment:  Cardiac Regular  tachycardia   Respiratory coarse airway noise   Gastrointestinal details normal distension, minimal bowel sounds.   Lab Results: Hepatic:  17-Apr-15 04:28   Bilirubin, Total  1.8  Bilirubin, Direct  1.5 (Result(s) reported on 01 Jul 2013 at 04:52AM.)  Alkaline Phosphatase  324 (45-117 NOTE: New Reference Range 02/04/13)  SGPT (ALT) 29  SGOT (AST) 30  Total Protein, Serum  5.5  Albumin, Serum  1.2  Routine Chem:  20-Mar-15 11:46   Creatinine (comp) 0.83  21-Mar-15 04:16   Creatinine  (comp) 0.85  22-Mar-15 04:35   Creatinine (comp) 0.81    19:42   Creatinine (comp) 0.78  23-Mar-15 04:49   Creatinine (comp) 0.80  24-Mar-15 04:39   Creatinine (comp) 0.90  26-Mar-15 08:00   Creatinine (comp) 0.95  28-Mar-15 10:28   Creatinine (comp) 0.93  29-Mar-15 02:28   Creatinine (comp) 0.80  30-Mar-15 03:48   Creatinine (comp) 0.85  31-Mar-15 04:27   Creatinine (comp) 0.80  01-Apr-15 09:05   Creatinine (comp) 0.77  02-Apr-15 04:04   Creatinine (comp) 0.81  03-Apr-15 03:39   Creatinine (comp) -    06:47   Creatinine (comp) 1.06    09:29   Creatinine (comp) 0.72  04-Apr-15 04:28   Creatinine (comp) -    05:28   Creatinine (comp) 0.81  05-Apr-15 03:19   Creatinine (comp) 0.76  06-Apr-15 01:46   Creatinine (comp) 0.80  07-Apr-15 04:18   Creatinine (comp) 0.75  08-Apr-15 04:21   Creatinine (comp) 0.69  09-Apr-15 04:38   Creatinine (comp) 0.64  10-Apr-15 04:30   Creatinine (comp) 0.71  11-Apr-15 04:37   Creatinine (comp) 0.70  13-Apr-15 04:17   Creatinine (comp) 0.91  14-Apr-15 03:51   Creatinine (comp)  1.45  15-Apr-15 06:37   Creatinine (comp)  2.32  16-Apr-15 11:05   Creatinine (comp)  3.04  17-Apr-15 04:28   Glucose, Serum  128  BUN  59  Creatinine (comp)  3.54  Sodium, Serum  149  Potassium, Serum 3.7  Chloride, Serum  113  CO2, Serum 29  Calcium (Total), Serum  7.5  Anion Gap 7  Osmolality (calc) 314  eGFR (African American)  22  eGFR (Non-African American)  19 (eGFR values <72m/min/1.73 m2 may be an indication of chronic kidney disease (CKD). Calculated eGFR is useful in patients with stable renal function. The eGFR calculation will not be reliable in acutely ill patients when serum creatinine is changing rapidly. It is not useful in  patients on dialysis. The eGFR calculation may not be applicable to patients at the low and high extremes of body sizes, pregnant women, and vegetarians.)  Magnesium, Serum 2.2 (1.8-2.4 THERAPEUTIC  RANGE: 4-7 mg/dL TOXIC: > 10 mg/dL  -----------------------)  Routine Hem:  20-Mar-15 11:46   Hemoglobin (CBC) 15.4  21-Mar-15 04:16   Hemoglobin (CBC) 14.2  22-Mar-15 04:35   Hemoglobin (CBC)  12.6    19:42   Hemoglobin (CBC)  10.5  23-Mar-15 04:49   Hemoglobin (CBC)  9.3  24-Mar-15 04:39   Hemoglobin (CBC)  8.7  26-Mar-15 08:00   Hemoglobin (CBC)  8.2  28-Mar-15 10:28   Hemoglobin (CBC)  7.3  29-Mar-15 02:28   Hemoglobin (CBC)  7.3 (Result(s) reported on 12 Jun 2013 at 02:51AM.)  30-Mar-15 03:48   Hemoglobin (CBC)  7.5  04-Apr-15 04:28   Hemoglobin (CBC) -    05:28   Hemoglobin (CBC)  8.3  05-Apr-15 03:19   Hemoglobin (CBC)  7.6  10-Apr-15 04:30   Hemoglobin (CBC)  8.6  11-Apr-15 04:37   Hemoglobin (CBC)  8.1  13-Apr-15 04:17   Hemoglobin (CBC)  8.2  15-Apr-15 06:37   Hemoglobin (CBC)  6.9  16-Apr-15 04:21   Hemoglobin (CBC)  7.5  17-Apr-15 04:28   WBC (CBC) 6.5  RBC (CBC)  2.14  Hemoglobin (CBC)  6.8  Hematocrit (CBC)  20.9  Platelet Count (CBC)  95  MCV 98  MCH 31.8  MCHC 32.6  RDW  19.1  Neutrophil % 71.8  Lymphocyte % 17.7  Monocyte % 8.3  Eosinophil % 1.9  Basophil % 0.3  Neutrophil # 4.7  Lymphocyte # 1.2  Monocyte # 0.5  Eosinophil # 0.1  Basophil # 0.0 (Result(s) reported on 01 Jul 2013 at 04:52AM.)   Radiology Results: [place]    16-Apr-15 16:00, [place]  [place]   REASON FOR EXAM:    elevated lfts. eval  COMMENTS:   Body Site: Gallbladder, Liver, Common Bile Duct    PROCEDURE: [place] - [place] SURVEY  - Jun 30 2013  4:00PM     CLINICAL DATA:  Elevated LFTs    EXAM:  [place] LIMITED - RIGHT UPPER QUADRANT    COMPARISON:  None.    FINDINGS:  Gallbladder:  Wall appears thickened measuring 4 mm. Mild pericholecystic fluid  identified. Sludge is noted within the lumen of the gallbladder.    Common bile duct:    Diameter: 4.3 mm    Liver:    No focal lesion identified. Within  normal limits in parenchymal  echogenicity.     IMPRESSION:  1. Gallbladder wall thickening and mild pericholecystic fluid.  Cannot rule out a calculus cholecystitis.  Electronically Signed    By: TKerby MoorsM.D.    On: 06/30/2013 16:25         Verified By: TAngelita Ingles M.D.,   Assessment/Plan:  Assessment/Plan:  Assessment 1) acute appendicitis/abscesss/p open appendectomy-on unasyn 2) fungemia-on mycamine  3) melena- PEG on 10/9, first black stool was reported 10/15.  brown stool x 1 in between. On bid iv protonix. last bm 2am. was on IBP earlier in hospitalization.  4) was started on tg  per peg today.  5) abdominal distensiion 6) lfts normal on admission now with mixed pattern, [place] c/w sludge, concern for acalculus cholecystitis- HIDA was ordered, to be done tomorrow? 7) vent dependant respiratory failure 8) anasarca   Plan 1) will do 2 way abd film-possible ileus with continued distension and poor bowel sounds. will hold TF for now, recommend tpn.  2) continue bid iv ppi 3) awaiting HIDA.  will check lipase.   Electronic Signatures: Loistine Simas (MD)  (Signed 17-Apr-15 20:04)  Authored: Chief Complaint, VITAL SIGNS/ANCILLARY NOTES, Brief Assessment, Lab Results, Radiology Results, Assessment/Plan   Last Updated: 17-Apr-15 20:04 by Loistine Simas (MD)

## 2014-07-08 NOTE — Consult Note (Signed)
Pt has low hgb of 6.9, nurse reported blackish stool.BUN up some to 40, creat 2.3, bowel sounds present but rare. Still distended.   He is to be transfused but had an antibody so it will be a little delayed.  Candida in blood cult.  Exam shows puffy arms from 3rd spacing of iv fluid,. No new suggestions, will start iv Protonix due to melena and fall in hgb..  Electronic Signatures: Scot JunElliott, Katerine Morua T (MD)  (Signed on 15-Apr-15 17:38)  Authored  Last Updated: 15-Apr-15 17:38 by Scot JunElliott, Tarrance Januszewski T (MD)

## 2014-07-08 NOTE — Consult Note (Signed)
Chief Complaint:  Subjective/Chief Complaint responds to voice some, no response to questions.   VITAL SIGNS/ANCILLARY NOTES: **Vital Signs.:   19-Apr-15 08:00  Vital Signs Type Routine  Temperature Temperature (F) 98  Celsius 36.6  Temperature Source oral  Pulse Pulse 96  Pulse source if not from Vital Sign Device per cardiac monitor  Respirations Respirations 16  Systolic BP Systolic BP 381  Diastolic BP (mmHg) Diastolic BP (mmHg) 86  Mean BP 111  BP Source  if not from Vital Sign Device non-invasive  Pulse Ox % Pulse Ox % 100  Pulse Ox Activity Level  At rest  Oxygen Delivery Ventilator Assisted  Pulse Ox Heart Rate 98   Brief Assessment:  Gastrointestinal details normal distended, just a little softer than yesterday on expiration.  bs positive more normal pitch   Lab Results: Routine Chem:  19-Apr-15 04:00   Result Comment LABS - This specimen was collected through an   - indwelling catheter or arterial line.  - A minimum of 64ms of blood was wasted prior    - to collecting the sample.  Interpret  - results with caution.  Result(s) reported on 03 Jul 2013 at 04:27AM.  Glucose, Serum  109  BUN  58  Creatinine (comp)  3.55  Sodium, Serum  153  Potassium, Serum 3.6  Chloride, Serum  115  CO2, Serum  34  Calcium (Total), Serum  7.7  Anion Gap  4  Osmolality (calc) 320  eGFR (African American)  22  eGFR (Non-African American)  19 (eGFR values <644mmin/1.73 m2 may be an indication of chronic kidney disease (CKD). Calculated eGFR is useful in patients with stable renal function. The eGFR calculation will not be reliable in acutely ill patients when serum creatinine is changing rapidly. It is not useful in  patients on dialysis. The eGFR calculation may not be applicable to patients at the low and high extremes of body sizes, pregnant women, and vegetarians.)  Routine Hem:  19-Apr-15 04:00   WBC (CBC) 6.2  RBC (CBC)  2.44  Hemoglobin (CBC)  7.5  Hematocrit  (CBC)  23.4  Platelet Count (CBC)  122  MCV 96  MCH 30.9  MCHC 32.1  RDW  18.8  Neutrophil % 71.9  Lymphocyte % 17.0  Monocyte % 6.2  Eosinophil % 3.2  Basophil % 1.7  Neutrophil # 4.5  Lymphocyte # 1.1  Monocyte # 0.4  Eosinophil # 0.2  Basophil # 0.1   Radiology Results: CT:    18-Apr-15 12:00, CT Abdomen and Pelvis Without Contrast  CT Abdomen and Pelvis Without Contrast   REASON FOR EXAM:    (1) fever, abscess; (2) fever, abscess  COMMENTS:       PROCEDURE: CT  - CT ABDOMEN AND PELVIS W0  - Jul 02 2013 12:00PM     CLINICAL DATA:  Fever.  Sepsis.  Evaluate for abscess.    EXAM:  CT ABDOMEN AND PELVIS WITHOUT CONTRAST    TECHNIQUE:  Multidetector CT imaging of the abdomen and pelvis was performed  following the standard protocol without intravenous contrast.    COMPARISON:  06/24/2013  FINDINGS:  Small bilateral pleural effusions and bibasilar atelectasis show no  significant change. Noncontrast images of the liver, gallbladder,  pancreas, spleen, adrenal glands, and kidneys are unremarkable in  appearance. No evidence of renal calculi or hydronephrosis. Foley  catheter is seen within the bladder which is decompressed.    No soft tissue masses or lymphadenopathy identified within the  abdomen  pelvis. No evidence of focal inflammatory process or  abnormal fluid collections. No evidence of bowel obstruction. Mild  diffuse body wall edema shows improvement since previous study.     IMPRESSION:  No evidence of abscess or other acute findings within the abdomen or  pelvis.  Decrease in diffuse body wall edema.    Stable small bilateral pleural effusions and bibasilar atelectasis.      Electronically Signed  By: Earle Gell M.D.    On: 07/02/2013 16:11         Verified By: Marlaine Hind, M.D.,    18-Apr-15 12:00, CT Head Without Contrast  CT Head Without Contrast   REASON FOR EXAM:    altered mental status  COMMENTS:       PROCEDURE: CT  - CT HEAD  WITHOUT CONTRAST  - Jul 02 2013 12:00PM     CLINICAL DATA:  Altered mental status    EXAM:  CT HEAD WITHOUT CONTRAST    TECHNIQUE:  Contiguous axial images were obtained from the base of the skull  through the vertex without intravenous contrast.    COMPARISON:  None.  FINDINGS:  There is mild diffuse atrophy. There is no appreciable mass,  hemorrhage, extra-axial fluid collection, or midline shift. There is  a subtle area of decreased attenuation in the left frontal lobe  which is ill-defined and concerning for an acute infarct. Elsewhere  gray-white compartments appear normal. Bony calvarium appears  intact. The mastoid air cells are hypoplastic. There is  opacification of several mastoid air cells on the left. Mastoids on  the right are clear. A probe is noted in the external auditory canal  on the right.     IMPRESSION:  Findings suspicious for acute infarct in the left frontal lobe. No  hemorrhage. There is mild underlying atrophy. 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  Electronically Signed    By: Lowella Grip M.D.    On: 07/02/2013 14:01         Verified By: Leafy Kindle. WOODRUFF, M.D.,   Assessment/Plan:  Assessment/Plan:  Assessment 1) s/p open appendectomy for appendicitis and intraabdominal abscess, s/p PEGstable abdomen improving slowly. repeat CT negative for any acute changes 2) CVA 3) melena-last black stool last night-hgb stable. 4) abnormal lfts-slow improving.   Plan 1) agree with advancing TF at a slow rate.  continue ppi as you are.   will follow for now.   Electronic Signatures: Loistine Simas (MD)  (Signed 19-Apr-15 11:37)  Authored: Chief Complaint, VITAL SIGNS/ANCILLARY NOTES, Brief Assessment, Lab Results, Radiology Results, Assessment/Plan   Last Updated: 19-Apr-15 11:37 by Loistine Simas (MD)

## 2014-07-08 NOTE — Consult Note (Signed)
Brief Consult Note: Diagnosis: Alcohol dependence, alcoholic liver disease.   Patient was seen by consultant.   Consult note dictated.   Recommend further assessment or treatment.   Comments: [name] has a h/o alcoholism. Here for ascites in the context of drinking. He is interested in treatment of alcoholism.  PLAN: 1. The patient does not need alcohol detox. Last drink 4 days ago.  2. He was given information about [facility] substance abuse treatment program.  Electronic Signatures: Kristine LineaPucilowska, Mattie Novosel (MD)  (Signed 20-Mar-15 17:06)  Authored: Brief Consult Note   Last Updated: 20-Mar-15 17:06 by Kristine LineaPucilowska, Amanii Snethen (MD)

## 2014-07-08 NOTE — H&P (Signed)
Subjective/Chief Complaint abd pain   History of Present Illness Two weeks abd pain nausea ate breakfast nothing since  no emesis no fevers abd swelling   Past History PMH depression, anxiety PSH none   Past Med/Surgical Hx:  HTN:   depression:   anxiety:   ALLERGIES:  No Known Allergies:   Family and Social History:  Family History Non-Contributory   Social History positive  tobacco, stopped drinking six months ago   + Tobacco Current (within 1 year)   Review of Systems:  Fever/Chills No   Cough No   Abdominal Pain Yes   Diarrhea No   Constipation No   Nausea/Vomiting No   SOB/DOE No   Chest Pain No   Tolerating Diet Yes  breakfast   Medications/Allergies Reviewed Medications/Allergies reviewed   Physical Exam:  GEN no acute distress, disheveled   HEENT pink conjunctivae   NECK supple   RESP normal resp effort  clear BS   CARD regular rate   ABD positive tenderness  soft  distended   LYMPH negative neck   EXTR negative edema   SKIN normal to palpation   PSYCH alert, A+O to time, place, person, poor insight   Lab Results: Hepatic:  20-Mar-15 11:46   Bilirubin, Total 0.7  Alkaline Phosphatase 59 (45-117 NOTE: New Reference Range 02/04/13)  SGOT (AST) 33  Total Protein, Serum 7.0  Albumin, Serum  2.5  Routine Chem:  20-Mar-15 11:46   Lipase 286 (Result(s) reported on 03 Jun 2013 at 03:24PM.)  B-Type Natriuretic Peptide ([facility]) 25 (Result(s) reported on 03 Jun 2013 at 02:46PM.)  Glucose, Serum  108  BUN  22  Creatinine (comp) 0.83  Sodium, Serum  128  Potassium, Serum 3.5  Chloride, Serum  92  CO2, Serum 32  Calcium (Total), Serum  7.9  Osmolality (calc) 261  eGFR (African American) >60  eGFR (Non-African American) >60 (eGFR values <23m/min/1.73 m2 may be an indication of chronic kidney disease (CKD). Calculated eGFR is useful in patients with stable renal function. The eGFR calculation will not be reliable in acutely  ill patients when serum creatinine is changing rapidly. It is not useful in  patients on dialysis. The eGFR calculation may not be applicable to patients at the low and high extremes of body sizes, pregnant women, and vegetarians.)  Anion Gap  4  Routine UA:  20-Mar-15 11:46   Color (UA) Yellow  Clarity (UA) Clear  Glucose (UA) Negative  Bilirubin (UA) Negative  Ketones (UA) Negative  Specific Gravity (UA) 1.017  Blood (UA) Negative  pH (UA) 6.0  Protein (UA) Negative  Nitrite (UA) Negative  Leukocyte Esterase (UA) Negative (Result(s) reported on 03 Jun 2013 at 12:08PM.)  RBC (UA) NONE SEEN  WBC (UA) 2 /HPF  Bacteria (UA) NONE SEEN  Epithelial Cells (UA) NONE SEEN  Result(s) reported on 03 Jun 2013 at 12:08PM.  Routine Coag:  20-Mar-15 11:46   Prothrombin 13.1  INR 1.0 (INR reference interval applies to patients on anticoagulant therapy. A single INR therapeutic range for coumarins is not optimal for all indications; however, the suggested range for most indications is 2.0 - 3.0. Exceptions to the INR Reference Range may include: Prosthetic heart valves, acute myocardial infarction, prevention of myocardial infarction, and combinations of aspirin and anticoagulant. The need for a higher or lower target INR must be assessed individually. Reference: The Pharmacology and Management of the Vitamin K  antagonists: the seventh ACCP Conference on Antithrombotic and Thrombolytic Therapy. Chest.2004 Sept:126 (3suppl):  2458-0998. A HCT value >55% may artifactually increase the PT.  In one study,  the increase was an average of 25%. Reference:  "Effect on Routine and Special Coagulation Testing Values of Citrate Anticoagulant Adjustment in Patients with High HCT Values." American Journal of Clinical Pathology 2006;126:400-405.)  Routine Hem:  20-Mar-15 11:46   WBC (CBC)  12.6  RBC (CBC) 4.99  Hemoglobin (CBC) 15.4  Hematocrit (CBC) 45.6  Platelet Count (CBC) 188  MCV 91  MCH  30.9  MCHC 33.8  RDW  14.9  Neutrophil % 63.7  Lymphocyte % 10.2  Monocyte % 24.6  Eosinophil % 1.0  Basophil % 0.5  Neutrophil #  8.0  Lymphocyte # 1.3  Monocyte #  3.1  Eosinophil # 0.1  Basophil # 0.1 (Result(s) reported on 03 Jun 2013 at 01:22PM.)   Radiology Results: CT:    20-Mar-15 17:21, CT Abdomen and Pelvis With Contrast  CT Abdomen and Pelvis With Contrast  REASON FOR EXAM:    (1) abd pain, swelling, possible ascities; (2) abd   pain, swelling, possible asci  COMMENTS:   May transport without cardiac monitor    PROCEDURE: CT  - CT ABDOMEN / PELVIS  W  - Jun 03 2013  5:21PM     CLINICAL DATA:  Left lowerquadrant pain x1 week, nausea/ vomiting,  urinary frequency    EXAM:  CT ABDOMEN AND PELVIS WITH CONTRAST    TECHNIQUE:  Multidetector CT imaging of the abdomen and pelvis was performed  using the standard protocol following bolus administration of  intravenous contrast.    CONTRAST:  125 mL Isovue 370 IV    COMPARISON:  None.    FINDINGS:  Lung bases are clear.    Liver, spleen, and adrenal glands are within normal limits.    Parenchymal calcifications in the pancreatic head/uncinate process,  likely sequela of prior/chronic pancreatitis.    Gallbladder is unremarkable. No intrahepatic or extrahepatic ductal  dilatation.    5 mm anterior left lower pole renal cyst (series 2/ image 36).  Kidneys are otherwise within normal limits. No hydronephrosis.    Dilated loops of small bowel in the anterior mid abdomen, favored to  reflect adynamic ileus.    Dilated appendix, measuring 13 mm with mucosal enhancement, and  associated proximal and distal appendicular its (coronal image 72).  Associated 6.6 x 8.7 x 9.1 cm fluid collection with developing thin  wall and nondependent gas in the right mid abdomen (series 2/image  23) and additional contiguous 7.6 x 5.7 x 6.7 cm ill-defined fluid  collection in the central lower abdominal mesentery (series  2/image  54). These findings are compatible with acute appendicitis with  perforation of the appendiceal tip and associated early/developing  abscesses.    Atherosclerotic calcifications of the abdominal aorta and branch  vessels.    No abdominopelvic ascites.    Small mesenteric lymph nodes, likely reactive.    Prostate is unremarkable.    Bladder is mildly thick-walled although underdistended.    Small fat containing bilateral inguinal hernias, left greater than  right.    Degenerative changes of the visualized thoracolumbar spine.     IMPRESSION:  Acute appendicitis with perforation of the appendiceal tip.    Two suspected early/developing abscesses, as described above.    Suspected secondary adynamic small bowel ileus.    Critical value/emergent results were called by telephone at the time  of interpretation on 06/03/2013 at 6:08 PM to Dr. Leontine Locket, who  verbally acknowledged these results.  Electronically Signed    By: Julian Hy M.D.    On: 06/03/2013 18:11         Verified By: Julian Hy, M.D.,    Assessment/Admission Diagnosis ruptured appendicitis rec lap appy, drainage, poss open discussed options rationale and risks in detail Dr Thomasene Lot assisted in getting pt to stay   Electronic Signatures: Florene Glen (MD)  (Signed 20-Mar-15 18:47)  Authored: CHIEF COMPLAINT and HISTORY, PAST MEDICAL/SURGIAL HISTORY, ALLERGIES, FAMILY AND SOCIAL HISTORY, REVIEW OF SYSTEMS, PHYSICAL EXAM, LABS, Radiology, ASSESSMENT AND PLAN   Last Updated: 20-Mar-15 18:47 by Florene Glen (MD)

## 2014-07-08 NOTE — Consult Note (Signed)
Pt CC ventilator dependent after abcess/appendicitis, needs PEG tube for longer term care.  Abd with some bowel sounds, distended, BUN 20, WBC 8.1, hgb 7.6 VSS.  I will provide assistance with PEG placement after trach done.   Electronic Signatures: Scot JunElliott, Aaryn Parrilla T (MD)  (Signed on 05-Apr-15 12:16)  Authored  Last Updated: 05-Apr-15 12:16 by Scot JunElliott, Samarth Ogle T (MD)

## 2014-07-08 NOTE — Op Note (Signed)
PATIENT NAME:  [name], [name] MR#:  [id number] DATE OF BIRTH:  13-Mar-1966  DATE OF PROCEDURE:  06/03/2013    PREOPERATIVE DIAGNOSIS: Acute appendicitis with abscess and perforation.   POSTOPERATIVE DIAGNOSIS: Acute appendicitis with abscess and perforation.   PROCEDURE PERFORMED:  1. Attempted laparoscopic appendectomy.  2. Exploratory laparotomy.  3. Open appendectomy.  4. Drainage of peritoneal abscess associated with appendicitis.  5. Partial omentectomy.  6. Placement of wound vacuum assisted closure greater than 50 square centimeters for delayed priamry closure.  SURGEON: Natale LayMark Zuhair Lariccia, M.D.   ASSISTANT: Scrub tech  ANESTHESIA: General endotracheal.   FINDINGS: There was acute perforated appendicitis with at least 300 milliliters of pus in 2 contained intraloop abscesses within the lower mid abdomen.   SPECIMENS: Appendix to pathology.   DRAINS: None.   COUNTS: LAP and needle count correct x2.   DESCRIPTION OF PROCEDURE: With informed consent, supine position, general oral endotracheal anesthesia, the patient's abdomen was clipped of hair, sterilely prepped and draped with ChloraPrep solution. Foley catheter was placed. Timeout was observed.   An infraumbilical vertical short skin incision was fashioned with scalpel. Hassan trocar was placed under direct visualization. Pneumoperitoneum was established. The patient was then positioned in Trendelenburg. A 5 millimeter trocar was placed in the right upper quadrant. Additional 5 millimeter trocar in the left lower quadrant. The omentum was densely adherent to the anterior abdominal wall. I took this down with gentle technique the utilizing suction aspirator device encountering a large amount of thick, creamy pus which was aspirated. Further dissection demonstrated densely adherent small bowel to the lower abdominal wall which could not be taken down with laparoscopic technique. The appendix was not easily identifiable.   At this point  for conversion to a laparotomy was made. Ports were then removed. Skin incision was fashioned inferiorly and superiorly above the umbilicus with scalpel and electrocautery through musculofascial layers. The peritoneum was entered. A self-retaining abdominal wall retractor was placed. Small bowel was ran in its entirety from the ligament of Treitz distally and multiple loculations of pus were disrupted. These were all aspirated. The abdomen at this point was copiously irrigated. Dissection in the right lower quadrant demonstrated a large fecalith in the midportion of the appendix. The base of the appendix was identified, found to be fairly unremarkable. Window was fashioned at the base of the mesoappendix with blunt technique and a GIA stapler was used to transect the base of the appendix. Utilizing the Harmonic's scalpel, the mesoappendix was then sequentially divided with small bites. Specimen was handed off the field.   At this point, the abdomen was irrigated with a total of 5 liters of warm normal saline. Bleeding was encountered from omentum were it was stuck to the anterior abdominal wall. In order to control this because it was so inflamed, a portion of the omentum was transected utilizing clamps and ties of #0 Vicryl suture.   With hemostasis ensured on the operative field, LAP and needle count correct x2, the abdominal contents were returned to the abdomen. Nasogastric tube was confirmed by palpation of the tip in the antrum and secured by anesthesia. The fascia was then reapproximated utilizing running #1 looped PDS from the extremes of the wound. Subcutaneous tissues were then irrigated. Incisional wound VAC was then placed over the skin for intended delayed primary closure at a later time.    ____________________________ Justin GainerMark A. Egbert GaribaldiBird, MD FACS [id number] D: 06/04/2013 19:53:38 ET T: 06/04/2013 22:26:30 ET JOB#: [id number]  cc: Loraine LericheMark A. Egbert GaribaldiBird,  MD, <Dictator> Ceaser Ebeling A Mohammad Granade MD ELECTRONICALLY SIGNED  06/05/2013 21:22

## 2014-07-08 NOTE — Consult Note (Signed)
PATIENT NAME:  Angelique HolmWADE, Chelsea L MR#:  [id number] DATE OF BIRTH:  11-18-1965  DATE OF CONSULTATION:  07/09/2013  DATE OF DICTATION: 07/09/2013   REFERRING PHYSICIAN:  Herschell Dimesichard J. Renae GlossWieting, MD CONSULTING PHYSICIAN:  Davina Pokehapman T. Jarita Raval, MD  REASON FOR CONSULTATION: Possible tracheostomy change.   HISTORY OF PRESENT ILLNESS: This is a 49 year old gentleman who was admitted on March 20th for a ruptured appendix with severe appendicitis and sepsis. Subsequently, taken to the ICU following this. Dr. Willeen CassBennett subsequently performed a tracheostomy on April 7th. Since that time, the patient has continued to recover, is no longer requiring positive pressure or ventilation. We are consulted to possibly downsize his trach to improve airway and speaking possibilities.   PAST MEDICAL HISTORY: Not well documented, and the patient is unable to give his history as he is somewhat sedated and has tracheostomy tube in position. Most recent past medical history significant for sepsis with ruptured appendicitis.   PAST SURGICAL HISTORY: Underwent abdominal relief of acute appendicitis with abscess.   MEDICATIONS: Noted and listed in the chart.   PHYSICAL EXAMINATION:  HEENT: External ears appear normal. The anterior nose is unremarkable. The oral cavity and oropharynx are benign. He has #8 cuffed Shiley tracheostomy tube in place. We gently extended his neck, removed the old trach. There was a silk suture beneath this which was removed. A #6 non-cuffed, nonfenestrated Shiley tube was placed into the airway without difficulty. This was secured using a soft trach collar.   IMPRESSION: Status post tracheostomy tube for respiratory distress. He is now downsized to a #6. 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 At some point in the future as an outpatient, he can most likely be decannulated, assuming he will not require any further intubations for this current illness. We will sign  off for now. Feel free to reconsult for further evaluation.   ____________________________ Davina Pokehapman T. Zuhair Lariccia, MD ctm:lb D: 07/09/2013 08:30:26 ET T: 07/09/2013 08:55:21 ET JOB#: [id number]  cc: Davina Pokehapman T. Nickola Lenig, MD, <Dictator> Davina PokeHAPMAN T Nysir Fergusson MD ELECTRONICALLY SIGNED 07/13/2013 8:30

## 2014-07-08 NOTE — H&P (Signed)
PATIENT NAME:  Angelique HolmWADE, Wash L MR#:  [id number] DATE OF BIRTH:  06-17-1965  DATE OF ADMISSION:  12/04/2013  PRIMARY CARE PHYSICIAN:  None.   REQUESTING PHYSICIAN:  Sharman CheekPhillip Stafford, M.D.   CHIEF COMPLAINT:  Abdominal pain, nausea and vomiting.   HISTORY OF PRESENT ILLNESS:  The patient is a 49 year old male with a known history of anxiety, depression, alcohol and tobacco abuse. He is being admitted for intractable nausea, vomiting, and abdominal pain. The patient was recently in the hospital earlier this year with ruptured appendicitis and peritoneal abscess, he had an open appendectomy on March 20, and subsequently a lengthy hospitalization complicated by postoperative VDRF requiring trach/bag,  bilateral DVTs, MRSA pneumonia and Clostridium difficile colitis along with fungemia and sepsis. He was in the ED on September 9 for the same symptoms with some pain at the site of hernia after eating.  He was discharged with CT scan of the abdomen and pelvis showing no acute pathology. The patient came back to the ED on September 14 with similar symptoms and was again discharged, and now he is back again with the same symptoms complaining of uncontrolled pain, 9/10 since his dinner last night. He had pinto beans and fried squash and after that he has been vomiting on and off for almost 1.5 hours.  Could not keep anything down and he has been hurting while in the ED. He has received Dilaudid, GI cocktail and Pepcid along with Reglan without significant relief. His pain is about 7/10 now but he could not keep anything down orally when they tried to give him some water and he is being admitted for further evaluation and management. He did undergo ultrasound of the abdomen in the ED today, which showed cholelithiasis but no other acute pathology.   PAST MEDICAL HISTORY:  1.  Bipolar disorder.  2.  Alcohol abuse.  3.  Hypertension.  4.  Depression.  5.  Anxiety.   PAST SURGICAL HISTORY:  Appendectomy.    ALLERGIES: No known drug allergies.   SOCIAL HISTORY: He has smoked 1 pack of cigarettes daily for 34 years. No alcohol. No IV drugs of abuse. He is disabled.   FAMILY HISTORY: Mother had bipolar disorder. His father had colon cancer.   HOME MEDICATIONS: 1.  Tylenol 650 mg p.o. every 6 hours as needed.  2.  Buspirone 15 mg p.o. 3 times a day.  3.  Cipro.  4.  Clonazepam 1 mg p.o. t.i.d. as needed.  5.  Colace 100 mg p.o. b.i.d.  6.  Cardizem 30 mg p.o. every 6 hours.  7.  Divalproate sodium 125 mg p.o. b.i.d.  8.  Ferrous sulfate 325 mg p.o. daily repair.  9.  Fluoxetine 40 mg p.o. daily. 10. Klor-Con 10 mEq 2 tablets p.o. daily.  11. Metoprolol 25 mg 2 tablets p.o. b.i.d.  12. Oxycodone 5 mg p.o. every 6 hours.  13. Protonix 40 mg p.o. daily.  14. Senna 1 tablet p.o. twice a day.   REVIEW OF SYSTEMS:  CONSTITUTIONAL: No fever, fatigue, weakness.  EYES: No blurred or double vision.  ENT: No tinnitus or ear pain.  RESPIRATORY: No cough, wheezing, hemoptysis.  CARDIOVASCULAR: No chest pain, orthopnea, edema.  GASTROINTESTINAL: Positive for nausea, vomiting, abdominal pain. No diarrhea. The patient does have a large ventral hernia, multiple scars. GENITOURINARY: No dysuria or hematuria.  ENDOCRINE: No polyuria or nocturia.  HEMATOLOGY: No anemia or easy bruising.  SKIN: No rash or lesion.  MUSCULOSKELETAL: No arthritis or muscle cramp.  NEUROLOGIC: No tingling, numbness, weakness.  PSYCHIATRIC: History of bipolar disorder.    PHYSICAL EXAMINATION:  VITAL SIGNS: Temperature 98, heart rate 105 per minute, respirations 22 per minute, blood pressure 131/87 mmHg. His oxygen saturation is 98% on room air.  GENERAL: The patient is a 49 year old male lying in bed in some acute abdominal pain.  HEENT: Pupils equal, round, reactive to light and accommodation. No scleral icterus. Extraocular muscles intact. Head atraumatic, normocephalic. Oropharynx and nasopharynx clear.  NECK:  Supple. No jugular venous distention. No thyroid enlargement or tenderness.  LUNGS: Clear to auscultation bilaterally. No wheezing, rales, rhonchi, or crepitation.  CARDIOVASCULAR: S1, S2 normal. No murmurs, rubs, gallops.  ABDOMEN: Soft, there is a large periumbilical ventral hernia containing loops of bowel, easily palpable. Bowel sounds present. No organomegaly or masses.  ABDOMEN: Soft, nontender. NEUROLOGIC: Cranial nerves III-XII intact. Muscle strength 5/5 in all extremities. Sensation intact.  PSYCHIATRIC: The patient is alert and oriented x3.  EXTREMITIES: No pedal edema, cyanosis or clubbing.  SKIN: No obvious rash, lesion, or ulcer.  MUSCULOSKELETAL: No joint effusion or tenderness.   LABORATORY DATA:  Normal BMP. Normal liver function tests. Normal first set of troponins. Normal CBC.   Urinalysis is negative.   IMAGING AND DIAGNOSTICS:  Ultrasound of the abdomen in the ED showed cholelithiasis. No other acute pathology.   Chest x-ray showed no acute cardiopulmonary disease.   Abdominal x-ray on September 14 showed no evidence of obstruction.   EKG showed sinus tachycardia, rate of 103 per minute. No acute ST-T changes.   IMPRESSION AND PLAN:  1.  Intractable nausea, vomiting, and abdominal pain; could be from viral gastroenteritis with retching.  Will provide symptomatic management. Consult gastroenterology for further evaluation.  2.  Hypertension. Continue home medication, adjust as needed.  3.  Bipolar disorder. We will continue home medication and adjust as needed. Consider psychiatry consult if needed.  4.  Ventral hernia. He is scheduled to have surgery on October 8 with [facility] outpatient surgery.   CODE STATUS: Full code.   Total time taking care of this patient: 45 minutes.    ____________________________ Ellamae Sia. Sherryll Burger, MD vss:lt D: 12/04/2013 06:48:19 ET T: 12/04/2013 08:28:12 ET JOB#: [id number]  cc: Maranda Marte S. Sherryll Burger, MD, <Dictator> Patricia Pesa  MD ELECTRONICALLY SIGNED 12/12/2013 15:55

## 2014-07-08 NOTE — Consult Note (Signed)
PATIENT NAME:  [name], [name] MR#:  [id number] DATE OF BIRTH:  October 16, 1965  DATE OF CONSULTATION:  05/30/2013  REFERRING PHYSICIAN:  Krystal EatonShayiq Ahmadzia, MD CONSULTING PHYSICIAN:  Lysa Livengood Lizabeth LeydenN. Jodette Wik, MD  REASON FOR CONSULTATION: Acute renal failure.   HISTORY OF PRESENT ILLNESS: The patient is a 49 year old Caucasian male with a past medical history of hypertension, depression, and anxiety who originally was admitted to [facility] on June 05, 2013. He was found to have acute appendicitis. He has had a protracted hospital course that has been quite complicated. He ultimately underwent exploratory laparotomy, open appendectomy, drainage of peritoneal abscess, partial omentectomy, and placement of wound VAC. His postoperative course was quite complicated. He was difficult to wean from the ventilator and remains on the ventilator, however, he currently has a PEG tube as well as tracheostomy in place. Dr. Belia HemanKasa has been following for this issue. He has also developed recent Candida albicans fungemia and is being followed by Dr. Sampson GoonFitzgerald for this issue. We are now consulted for the evaluation and management of acute renal failure. [name] baseline creatinine appears to be 0.7. On April 14th creatinine was up to 1.45. Yesterday creatinine was up to 2.32. There have been some isolated periods of hypotension. There was also an elevated vancomycin level noted on June 26, 2013. The patient has also had contrast exposure on April 10th.  He has also had anemia of blood loss. All of these factors have likely contributed to acute renal failure now.   PAST MEDICAL HISTORY: 1.  Depression.  2.  Anxiety.  3.  Hypertension.  4.  Recent complicated appendicitis status post exploratory laparotomy, open appendectomy, peritoneal abscess drainage, partial omentectomy.  5.  Tracheostomy placement.  6.  PEG tube placement.  7.  Recent Candida albicans fungemia.  8.  Respiratory failure.    ALLERGIES: No known drug allergies.   CURRENT INPATIENT MEDICATIONS: Include:  1.  Normal saline 0.9 at 50 mL/h.  2.  Morphine 2 to 4 mg IV q. 2 hours p.r.n.  3.  Flovent 2 puffs inhaled q. 12 hours.  4.  Lorazepam 1 to 2 mg IV q. 2 hours p.r.n.  5.  Albuterol ipratropium 4 puffs inhaled q. 4 hours.  6.  Acetaminophen 650 mg q. 6 hours.  7.  Hydralazine 10 mg IV q. 4 to 6 hours p.r.n.  8.  Sliding scale insulin.  9.  Zofran 4 mg IV q. 4 hours p.r.n.  10.  Alprazolam 1 mg p.o. q. 8 hours.  11.  Fluconazole 600 mg IV daily.  12.  Unasyn 3 grams IV q. 6 hours.  13.  Protonix 40 mg IV q. 12 hours.   SOCIAL HISTORY: Unable to obtain from the patient as he is currently on the ventilator and has tracheostomy in place.   FAMILY HISTORY: Unable to obtain from the patient as he is currently on the ventilator and unable to provide this information.  REVIEW OF SYSTEMS: Unobtainable from the patient at this time as he is on the ventilator and unable to verbalize.   PHYSICAL EXAMINATION: VITAL SIGNS: Temperature 99.6, pulse 88, respirations 26, blood pressure 117/70, and pulse ox 92% on the ventilator.  GENERAL: Reveals an ill-appearing male currently in no acute distress.  HEENT: Normocephalic, atraumatic. Extraocular movements are intact. Pupils are equal, round, and reactive to light. No scleral icterus. Conjunctivae are pale. No epistaxis noted. Oral mucosa moist.  NECK: Supple. Tracheostomy noted to be in place.  LUNGS: Demonstrate  bilateral rhonchi and breath sounds are vent assisted.  HEART: S1 and S2 regular rate and rhythm. No murmurs, rubs, or gallops appreciated.  ABDOMEN: Distended. There is a midline abdominal wound in place. There is also a PEG tube in place. Bowel sounds are auscultated.  EXTREMITIES: 3+ pitting edema noted in bilateral lower extremities.  NEUROLOGIC: The patient is awake. He does move all 4 extremities spontaneously.  GENITOURINARY: Suprapubic tenderness was  not found. There is a Foley catheter noted to be in place.  MUSCULOSKELETAL: No joint redness, swelling, or tenderness appreciated.  SKIN: Warm and dry. No rashes noted.  PSYCHIATRIC: Unable to fully assess at this time.   LABORATORY DATA: Sodium 143, potassium 3.3, chloride 101, CO2 31, BUN 40, creatinine 2.3. Hemoglobin 7.5. Blood cultures from June 24, 2013 showed Candida albicans. Repeat blood cultures from April 14th show no growth thus far. Most recent urinalysis from June 24, 2013 was negative for protein, 45 RBCs per high-power field, and 3 WBCs per high-power field.   IMPRESSION: This is a 49 year old Caucasian male with past medical history of hypertension, depression, and anxiety who presented to [facility] with acute appendicitis and underwent open appendectomy, drainage of peritoneal abscess, and partial omentectomy. Hospital course was complicated with prolonged respiratory failure and the patient is status post tracheostomy and PEG tube placement. He has also had recent Candida albicans fungemia. We are consulted for evaluation and management of acute renal failure.   PROBLEM LIST: 1.  Acute renal failure  2.  Respiratory failure status post tracheostomy placement, still on ventilator.  3.  Hypokalemia, serum potassium 3.3.  4.  Anemia of blood loss.  5.  Candida albicans fungemia.   PLAN: The patient has had a rather protracted hospital course. He originally presented with appendicitis and ended up having an open appendectomy. He is now status post tracheostomy placement as well as PEG tube placement. Hospital course is also complicated by recent Candida albicans fungemia. We are consulted for the evaluation and management of acute renal failure. [name] acute renal failure is likely multifactorial with contributions from elevated vancomycin level, periodic hypotensive events, fungemia, and anemia of blood loss. At present no acute indication for  dialysis. Continue supportive care with enteral nutrition as well as parenteral IV fluids. Continue to follow renal function trend. Dose medications for renal function and avoid nephrotoxins as possible. No indication for dialysis at this point in time. However, we will continue to monitor the patient's progress closely.   I would like to thank Dr. Jacques Navy for this kind referral. Further plan as the patient progresses.   ____________________________ Lennox Pippins, MD mnl:sb D: 06/30/2013 10:26:10 ET T: 06/30/2013 10:52:40 ET JOB#: [id number]  cc: Lennox Pippins, MD, <Dictator> Ria Comment Wai Litt MD ELECTRONICALLY SIGNED 07/01/2013 9:22

## 2014-07-08 NOTE — Op Note (Signed)
PATIENT NAME:  [name], [name] MR#:  [id number] DATE OF BIRTH:  1965/03/29  DATE OF PROCEDURE:  06/08/2013  PREOPERATIVE DIAGNOSIS:  Aspiration pneumonitis.  POSTOPERATIVE DIAGNOSIS: Aspiration pneumonitis.  PROCEDURES:  1. Insertion of a peripherally inserted central venous catheter, triple lumen PICC line left arm, same venous access.  SURGEON: Levora DredgeGregory Daniqua Campoy, M.D.  ANESTHESIA: Local.   ESTIMATED BLOOD LOSS: Minimal.   INDICATION FOR PROCEDURE:  1.  Requiring antibiotics for greater than 5 days. 2.  Requiring multiple vesicant medications to sustain his life.  DESCRIPTION OF PROCEDURE: [name] left arm was sterilely prepped and draped, and a sterile surgical field was created. The existing PICC line was then accessed without difficulty. An 0.018 wire was then advanced through the existing PICC line and the PICC line removed. The new PICC line was trimmed to the same length and advanced into the venous system over the wire after a dilator peel-away sheath had been inserted.  Catheter tip position was verified with portable chest x-ray. The patient tolerated the procedure well. The line flushes easily with heparinized saline.      ____________________________ Renford DillsGregory G. Dmya Long, MD ggs:dmm D: 06/08/2013 18:59:52 ET T: 06/08/2013 22:56:48 ET JOB#: [id number]  cc: Renford DillsGregory G. Darron Stuck, MD, <Dictator> Renford DillsGREGORY G Kimberli Winne MD ELECTRONICALLY SIGNED 06/10/2013 17:24

## 2014-07-08 NOTE — Consult Note (Signed)
PATIENT NAME:  [name], [name] MR#:  [id number] DATE OF BIRTH:  02-21-1966  DATE OF CONSULTATION:  12/04/2013  CONSULTING PHYSICIAN:  Cristal Deerhristopher A. Cheyrl Buley, MD  PATIENT NAME: The patient [name], [name].   REASON FOR CONSULTATION: Nausea, vomiting, epigastric pain, now resolved; painful abdominal hernia.   HISTORY OF PRESENT ILLNESS: [name] is a pleasant 49 year old male with history of anxiety, depression, alcohol and tobacco use and recent admission for open appendectomy for a ruptured appendix and peritoneal abscess which had required a long-lasting hospital course including respiratory failure requiring tracheostomy, bilateral DVTs, pneumonia, Clostridium difficile colitis, fungemia sepsis. He presents with pain in the epigastrium after eating heavy fried foods. He does not have gallstones. However, upon talking to him his biggest complaint was an abdominal hernia which is to be repaired on October 08. Otherwise, prior to this he has been in the ED multiple times this week for hernia pain. He has had a CT which showed a large hernia with bowel stuck to it but no obvious acute surgical needs, no fevers, chills, night sweats, shortness of breath, cough, chest pain, diarrhea, constipation, dysuria or hematuria.   PAST MEDICAL HISTORY:  1.  Ventral hernia.  2.  Open appendectomy with perineal abscess.  3.  History of tracheostomy.  4.  History of gastrostomy.  5.  History of bipolar disorder.  6.  History of alcohol use.  7.  History of hypertension.  8.  History of depression.  9.  History of anxiety.   ALLERGIES: No known drug allergies.   SOCIAL HISTORY: Tobacco 1 pack a day x 34 years, denies alcohol or drug use currently.   FAMILY HISTORY:  Bipolar disorder in mother, father with colon cancer.   HOME MEDICATIONS:  1.  Tylenol.  2.  Buspirone.  3.  Cipro.  4.  Clonazepam.  5.  Colace.  6.  Cardizem.  7.  Iron sulfate.  8.  Fluoxetine. 9.  Klor-con.  10. Metoprolol.  11.  Oxycodone.  12. Protonix. 13. Senna.  REVIEW OF SYSTEMS: Abdominal pain, epigastric pain, nausea and vomiting. Otherwise, a 12-point review of systems was obtained. Pertinent positives and negatives above.   PHYSICAL EXAMINATION:  VITAL SIGNS: Temperature 98.8, pulse 73, blood pressure 98/66, respirations 19.  GENERAL: No acute distress. Alert and oriented x 3.  HEAD: Normocephalic, atraumatic.  EYES: No scleral icterus. No conjunctivitis.  FACE: No obvious facial trauma. Normal external nose. Normal external ears.  CHEST: Lungs clear to auscultation, moving air well.  HEART: Regular rate and rhythm. No murmurs, rubs, or gallops.  ABDOMEN: Soft, nontender, nondistended. Does have a large lower incisional large hernia.  EXTREMITIES: Moves all extremities well. Strength 5/5. NEUROLOGIC: Cranial nerves II-XII intact.   LABORATORY DATA: Has 3 sets of laboratory data from September 14, September 17, September 19, all are unremarkable.   Ultrasound shows gallstones. No gallbladder wall thickening.   X-rays otherwise on the 3 days show nothing. CT scan on September 09 is read as diastasis but physical examination indicates hernia, midline anterior abdominal wall, with multiple loops of large and small bowel.   ASSESSMENT AND PLAN: [name] is a 49 year old with sequellae which my indicate pain related to gallstones. However, there are no immediate indications for cholecystectomy at this time as he is afebrile, has no white cell count and no findings on ultrasound of cholecystitis. He does have a large hernia which again, is in no emergent need of repair.  He may follow up as an outpatient.  Okay to transition to a low-fat diet and there are no surgical issues at this time.    ____________________________ Si Raider Antwine Agosto, MD cal:lt D: 12/04/2013 14:42:40 ET T: 12/04/2013 15:31:49 ET JOB#: [id number]  cc: Cristal Deer A. Oluwatosin Bracy, MD, <Dictator> Jarvis Newcomer  MD ELECTRONICALLY SIGNED 12/14/2013 0:57

## 2014-07-08 NOTE — Consult Note (Signed)
Pt running a fever, PEG feeding at 20cc/hr without residual per RN.  Abd distended but not as much Friday, bowel sounds better quality today.  Can increase feeding rate 10/cc per day if residuals ok.  Still spiking temps.   Electronic Signatures: Scot JunElliott, Juliany Daughety T (MD)  (Signed on 13-Apr-15 16:14)  Authored  Last Updated: 13-Apr-15 16:14 by Scot JunElliott, Shloime Keilman T (MD)

## 2014-07-08 NOTE — Op Note (Signed)
PATIENT NAME:  Angelique HolmWADE, Jemell L MR#:  [id number] DATE OF BIRTH:  [birth year]  DATE OF PROCEDURE:  06/21/2013  PREOPERATIVE DIAGNOSIS: Respiratory failure.   POSTOPERATIVE DIAGNOSIS: Respiratory failure.   PROCEDURE: Elective tracheostomy.   SURGEON: Marion DownerScott Ananiah Maciolek, MD   ANESTHESIA: General endotracheal.   INDICATIONS: The patient with a history of respiratory failure with failed extubation after appendectomy.   DESCRIPTION OF PROCEDURE: After obtaining informed consent, the patient was taken to the Operating Room and placed in the supine position. He was already intubated and sedated. The skin over the trachea was injected with 1% lidocaine with epinephrine 1:100,000. He was then prepped and draped in the usual sterile fashion. A 15 blade was used to incise the skin over the trachea. The incision was carried down through the platysma with the Bovie. The strap muscles were encountered and were divided in the midline using the Bovie. Small anterior jugular vein was encountered during the dissection. This was divided with the Harmonic scalpel. Dissection was carried down to the trachea and the thyroid gland. The thyroid gland was divided in the midline with the Harmonic scalpel and dissected away from the anterior wall of the trachea using the Bovie and blunt dissection. The trachea was inspected with incision planned between the second and third tracheal rings. The anesthesiologist was notified that this would be occurring. The  11 blade was then used to incise between the second and third tracheal rings and a inferiorly-based flap created by cutting through the third tracheal ring on either side. This flap was sewn up towards the skin using a 2-0 silk suture. The airway was suctioned and the anesthesiologist pulled the endotracheal tube back and let the balloon down until the tip of the tube was pulled back just beyond the trachea window. A #8 Shiley cuffed tracheostomy tube was then easily placed and  hooked up to the anesthesia circuit and the balloon inflated. CO2 return was confirmed and the patient appeared to have excellent oxygenation. The tube was suctioned to remove some mucous plugs and he was hooked up to the anesthesia circuit again. The retractors were removed and the tracheostomy tube secured in place with 2-0 silk sutures followed by Velcro trach tie. A Betadine soaked gauze was used as a dressing. The patient was then returned to the anesthesiologist for transport back to the CCU.   Blood loss was less than 25 mL.   ____________________________ Ollen GrossPaul S. Willeen CassBennett, MD psb:sg D: 06/21/2013 13:47:00 ET T: 06/21/2013 14:08:15 ET JOB#: [id number]  cc: Ollen GrossPaul S. Willeen CassBennett, MD, <Dictator> Sandi MealyPAUL S Omarion Minnehan MD ELECTRONICALLY SIGNED 07/06/2013 7:47

## 2014-07-08 NOTE — Consult Note (Signed)
Brief Consult Note: Diagnosis: respiratory failure.   Patient was seen by consultant.   Consult note dictated.   Orders entered.   Comments: Trache changed.  Speech consulted for speaking valve.  Would not completely decannulate until all surgical procedures are complete.  Electronic Signatures: Davina PokeMcqueen, Zaray Gatchel T (MD)  (Signed 25-Apr-15 08:31)  Authored: Brief Consult Note   Last Updated: 25-Apr-15 08:31 by Davina PokeMcqueen, Angles Trevizo T (MD)

## 2014-07-08 NOTE — Consult Note (Signed)
Pt seen today. Hard to get a sense of his need for the trach, generally it appears he has had some desats due to his underlying respiratory status which has been corrected with supplemental nasal cannula O2, and has kept trach capped for most part. He seemed to indicate a couple of episodes where he needed it uncapped, but he is a poor historian and the nurse in the room was unsure. No longer febrile, so sepsis seems to be improving. continue to monitor. With continued improvement I suspect we can remove the trach. Will reasses Wednesday and try to et an idea from the nuses whether they still need to frequently suction him.   Electronic Signatures: Sandi MealyBennett, Thorn Demas S (MD)  (Signed on 11-May-15 11:33)  Authored  Last Updated: 11-May-15 11:33 by Sandi MealyBennett, Yosgart Pavey S (MD)

## 2014-07-08 NOTE — Consult Note (Signed)
PATIENT NAME:  [name], [name] MR#:  [id number] DATE OF BIRTH:  1966-03-04  DATE OF CONSULTATION:  06/28/2013  REQUESTING PHYSICIAN:  Dory Larsen, MD CONSULTING PHYSICIAN:  Stann Mainland. Sampson Goon, MD  REASON FOR CONSULTATION:  Candidemia.  HISTORY OF PRESENT ILLNESS: This is a complicated 49 year old gentleman initially admitted 06/03/2013 with history of alcoholic liver disease. He had been having 2 weeks of abdominal pain and nausea.  He was found on admission to have a ruptured appendix. He underwent surgery with laparoscopy, laparotomy, and drainage of the peritoneal abscess as well as an appendectomy on March 20th. There was noted to be 300 mL of pus in the abdomen. The patient was admitted to the intensive care unit following that. He has had a complicated course. He underwent delayed primary closure of the wound March 25th. He was treated with broad-spectrum antibiotics as well. He has had fevers off and on throughout his hospitalization.  He failed extubation and was seen by ENT and underwent placement of a tracheostomy on April 7th and a PEG tube done on April 9th.   In the setting of persistent fevers, blood cultures were done April 10th and revealed Candida albicans. The patient was started on fluconazole. We were consulted for further management of candidemia.   PAST MEDICAL HISTORY: Alcoholism and alcoholic liver disease with cirrhosis.   PAST SURGICAL HISTORY: As above.   FAMILY HISTORY: Unable to be obtained.   SOCIAL HISTORY: Is unable to be obtained but per review of notes is heavy alcohol use. He is on disability. He does have a history of prior DUIs.   REVIEW OF SYSTEMS:  Unable to be obtained.   ALLERGIES:  No known drug allergies.   ANTIBIOTICS SINCE ADMISSION: Ceftriaxone from March 20th until April 8th, fluconazole begun April 12th, Zosyn begun April 9th through April 12th, vancomycin started April 9th through April 12th, voriconazole he would receive 2 doses of April  11th and April 12th, and metronidazole from March 20th until April 9th.   CURRENT ANTIBIOTIC ORDERED: Includes fluconazole.   PHYSICAL EXAMINATION:  VITAL SIGNS: Temperature 99.4, T-max over the last 24 hours 101, blood pressure is 127/72, pulse 100, respirations 12, saturations 96% on ventilator.  GENERAL: He is chronically ill-appearing and he has a tracheostomy tube. He opens his eyes and seems to try to cough but is not able to communicate effectively. His mucous membranes are moist.  NECK: Supple with the tracheostomy in place. The tracheostomy site does have a lot of mucus.  HEART: Regular.  LUNGS: Have rhonchi bilaterally.  ABDOMEN: Distended, relatively soft. No apparent tenderness.  He has a chronic Foley in place.  EXTREMITIES: He has trace bilateral lower extremity edema.   LABORATORY DATA: Reviewed from admission. Blood cultures, April 10th, showed 2/2  bottles with Candida albicans. Urine culture, April 10th, also grew Candida albicans greater than 100,000. Sputum culture, March 29th, with Candida albicans moderate growth. Blood cultures, March 29th, negative x 2 and urine culture, March 29th, negative. White blood count currently is 11.0 on April 13th, hemoglobin 8.2, platelets 113,000. Renal function shows a creatinine of 1.45 up from 0.91.   IMAGING DATA: CT scan done April 10th of the chest, abdomen, and pelvis shows stable tracheostomy tube and mediastinal lymphadenopathy. Numerous calcified bilateral hilar and mediastinal lymph nodes, calcified granuloma in the right lower lobe, small left pleural effusion and trace right pleural effusion, posterior bilateral lower airspace disease, interspace opacities which favor atelectasis. Improvement in left lower lobe consolidation. Gallbladder  slightly distended with some small amount of pericholecystic fluid. Postsurgical changes in the midline. Skin staples present. Previously seen central mesenteric fluid collection has decreased slightly  in size, measuring 2.3 to 2.1 cm compared with 3.0.   IMPRESSION: A 49 year old complicated gentleman status post perforated appendix with pus-filled abdomen status post initial surgery March 20th with a complicated postoperative course, now with tracheostomy and PEG tube. He has candidemia with Candida albicans on 2/2 blood cultures.  Also appears to have an ongoing abdominal abscess. He has had fevers as well.   RECOMMENDATIONS:  1. Continue fluconazole for candidemia.  2. PICC line has been removed after the candidemia and a new line has been placed.  3. Repeat blood cultures x 2 to document clearance.  4. Monitor closely for need to restart his other antibiotics. I suspect his abdominal abscess will need further treatment. Unasyn could be a potential agent that would provide coverage.  5. Evaluate with an echocardiogram to assess for endocarditis.  6. I would recommend 2-week course of fluconazole from first negative blood cultures.  7. I would consider ophthalmological consultation to evaluate for any fungal endophthalmitis.   Thank you for the consult. I will be glad to follow with you.    ____________________________ Stann Mainlandavid P. Sampson GoonFitzgerald, MD dpf:dd D: 06/28/2013 21:57:00 ET T: 06/28/2013 23:12:29 ET JOB#: [id number]  cc: Stann Mainlandavid P. Sampson GoonFitzgerald, MD, <Dictator> Norfleet Capers Sampson GoonFITZGERALD MD ELECTRONICALLY SIGNED 07/03/2013 21:15

## 2014-07-08 NOTE — Discharge Summary (Signed)
PATIENT NAME:  [name], [name] DATE OF BIRTH:  [birth year]  DATE OF ADMISSION:  12/04/2013 DATE OF DISCHARGE:  12/05/2013  DISCHARGE DIAGNOSIS: Abdominal pain/intractable nausea, vomiting, now resolved. Tolerated diet. Likely chronic abdominal pain from underlying large ventral hernia.   SECONDARY DIAGNOSES: 1.  Bipolar disorder.  2.  Alcohol abuse.  3.  Hypertension.  4.  Anxiety/depression.   CONSULTATIONS: Surgery, Dr. Ida Roguehristopher Lundquist.   PROCEDURES AND RADIOLOGY: Chest x-ray on September 19 showed no acute cardiopulmonary disease.   Abdominal ultrasound on September 20 showed cholelithiasis, otherwise no acute pathology.   MAJOR LABORATORY PANEL: UA on admission was negative.   HISTORY AND SHORT HOSPITAL COURSE: The patient is a 49 year old male with above-mentioned medical problems who was admitted for abdominal pain, nausea, vomiting, which was thought to be likely viral gastroenteritis. Please see Dr. Margaretmary EddyShah's dictated history and physical for further details. The patient was evaluated by surgery, Dr. Ida Roguehristopher Lundquist who recommended conservative management and outpatient [facility]up [facility] for repair of his large midline anterior abdominal wall hernia which was already [facility]. The patient was feeling much better by September 21, was able to tolerate the diet. He did not have any further nausea, vomiting, and his abdominal pain was fairly well controlled and was discharged home in stable condition on September 21.   On the date of discharge his vital signs are as follows: Temperature 98.1, heart rate 80 per minute, respirations 18 per minute, blood pressure 117/75 mmHg, saturating 92% on room air.   PERTINENT PHYSICAL EXAMINATION ON THE DATE OF DISCHARGE: CARDIOVASCULAR: S1, S2 normal. No murmurs, rubs or gallop.  LUNGS: Clear to auscultation bilaterally. No wheezing, rales, rhonchi, or crepitation.  ABDOMEN:  Soft, benign.  NEUROLOGIC: Nonfocal examination. All other physical examination remained at baseline.   DISCHARGE MEDICATIONS: 1.  Cardizem 30 mg p.o. every 6 hours.  2.  Buspirone 15 mg p.o. 3 times a day.  3.  Tylenol 650 mg p.o. every 6 hours as needed.  4.  Divalproex sodium 125 mg p.o. b.i.d.  5.  Clonazepam 1 mg p.o. 3 times a day as needed.  6.  Colace 100 mg p.o. b.i.d.  7.  Senna twice daily.  8.  Ferrous sulfate 325 mg p.o. daily.  9.  Protonix 40 mg p.o. daily.  10.   Klor-Con 10 mEq 2 tablets p.o. daily.  11.   Metoprolol 25 mg 3 tablets p.o. b.i.d.   12.   Fluoxetine 40 mg p.o. daily.  13.   Oxycodone 5 mg p.o. at bedtime as needed.  14.   Habitrol transdermal patch once daily for 21 days.   DISCHARGE DIET: Low sodium. Eat light for the first meal. Avoid any chicken or solid diet. Only soft food [facility].   DISCHARGE ACTIVITY: As tolerated.   DISCHARGE INSTRUCTIONS AND FOLLOWUP: The patient was instructed to follow up with [facility] as scheduled on October 8 for hernia repair. [facility] in 1 to 2 weeks.   TOTAL TIME DISCHARGING THIS PATIENT: 55 minutes.    ____________________________ Ellamae SiaVipul S. Sherryll BurgerShah, MD vss:at D: 12/08/2013 11:18:04 ET T: 12/08/2013 11:57:22 ET JOB#: [id number]  cc: Justin Abend S. Sherryll BurgerShah, MD, <Dictator> Christopher A. Juliann PulseLundquist, MD Patricia PesaVIPUL S Giavanni Odonovan MD ELECTRONICALLY SIGNED 12/10/2013 17:10

## 2014-07-08 NOTE — Consult Note (Signed)
PATIENT NAME:  [name], [name] MR#:  [id number] DATE OF BIRTH:  1966/03/13  DATE OF CONSULTATION:  06/17/2013  CONSULTING PHYSICIAN:  Scot Junobert T. Kaytelyn Glore, MD  The patient is a 49 year old white male who was admitted to the hospital on March 20 and had a surgery for a ruptured appendix with abscess formation. Since then, he has had a rocky course and has failed extubation twice. Most recent attempt was 06/16/2013. He had about 500 mL of bilious fluid. I was called to see him for consideration for a placement of a PEG tube.   The patient has been on a ventilator since his surgery.   ALLERGIES: None.   CURRENT MEDICATIONS: Propofol drip, fentanyl drip, insulin drip, subcutaneous Lovenox, IV ceftriaxone, IV hydromorphone, IV lorazepam, IV ondansetron, IV pantoprazole, albuterol p.r.n., and furosemide.   PAST MEDICAL HISTORY: Hypertension, anxiety and depression.   REVIEW OF SYSTEMS: Unobtainable. He is on a ventilator.   PHYSICAL EXAMINATION:  VITAL SIGNS: Pulse 74, respirations 20, blood pressure 140/88, 92% sat on a ventilator.  HEENT: Sclerae anicteric. Conjunctivae negative.  HEAD: Atraumatic.  CHEST: Clear anterior upper fields.  HEART: No murmurs or gallops I can hear.  ABDOMEN: Distended. A few bowel sounds are heard in both sides of the abdomen. He has a large dressing in the mid abdomen, which was not disturbed.  EXTREMITIES: His hands are puffy, forearms also. His   lower extremities show no edema.  RECTAL: Exam not done at this time. Will be performed tomorrow.    LABORATORY AND RADIOLOGICAL DATA: Recent glucose 122, BUN 15, creatinine 0.7, sodium 146, potassium 3.7, chloride 108, CO2 of 34, calcium of 8, phosphorus 3.5, magnesium 2. The last white count on March 30 was 7.5, hemoglobin 7.5, platelet count 303. Last arterial blood gas recorded was pH 7.35, pCO2 of 64, pO2 of 61; that was on March 30. On April 1 blood gas, pH 7.29, pCO2 of 76, pO2 of 273. On April 2, pH 7.43, pCO2 of 54,  pO2 of 55.   Chest x-ray showed endotracheal tube appears adequately positioned. NG tube was not seen on this study. Persistent bilateral interstitial and perihilar opacities, which are somewhat decreased.   ASSESSMENT AND PLAN: The patient has been on a ventilator for 14 days and failed extubation twice. One of his CAT scan showed some dilatations of loops of small bowel and some probable pus pockets. He was reported to have a large amount of fluid in his stomach related to NG feedings. It is possible that when he has a PEG inserted and it is attempted to feed him with that, he may have excessive residual amounts because of those same problems. However, since he is going to require long-term intubation, it is reasonable to proceed with a PEG placement. This will be done the day after his tracheostomy and will be a pull-through technique with assistance from one of the surgeons on the hospitalist staff.  ____________________________ Scot Junobert T. Chawn Spraggins, MD rte:jcm D: 06/17/2013 15:00:48 ET T: 06/17/2013 15:43:52 ET JOB#: [id number]  cc: Scot Junobert T. Rigdon Macomber, MD, <Dictator> Mark A. Egbert GaribaldiBird, MD Scot JunOBERT T Martyna Thorns MD ELECTRONICALLY SIGNED 06/27/2013 14:18

## 2014-07-08 NOTE — Consult Note (Signed)
PATIENT NAME:  [name], [name] MR#:  [id number] DATE OF BIRTH:  19-Nov-1965  DATE OF CONSULTATION:  06/03/2013  REFERRING PHYSICIAN:   Adah Salvage. Excell Seltzer, MD CONSULTING PHYSICIAN:  Beanca Kiester B. Evelena Masci, MD  REASON FOR CONSULTATION: To evaluate a patient with alcoholism.   IDENTIFYING DATA: Mr. Schiavo is a 49 year old alcoholic.   CHIEF COMPLAINT: "My stomach hurts."   HISTORY OF PRESENT ILLNESS:  Mr. Radke has a long history of alcoholism. He reports that every 10 years he relapses and has a really bad stretch of drinking. His longest period of sobriety was 12 years. He has been drinking at least a 5th of vodka daily at the beginning of the year. Lately he has not been able to handle much alcohol and reports that 4 days ago he had a 6-pack of beer. He developed abdominal pain and came to the Emergency Room complaining of it.  His abdomen is distended and most likely has ascites. The patient knows that his liver is bad from drinking. He has never been treated for alcoholism and never was admitted for detox or substance abuse treatment. He denies any symptoms of psychosis or symptoms suggestive of bipolar mania. He does feel depressed lately, especially when he is unable to stop drinking and his general health has been deteriorating. He denies, other than alcohol, substance use.   PAST PSYCHIATRIC HISTORY: As above. He has never been treated. No suicide attempts.   FAMILY PSYCHIATRIC HISTORY: Multiple family members with alcoholism.   PAST MEDICAL HISTORY: Alcoholic liver disease and ascites.  ALLERGIES: No known drug allergies.   MEDICATIONS ON ADMISSION: None.   SOCIAL HISTORY: He applied for disability and just now received news that he was approved. It is unclear what this disability is for. He has to wait a couple of more weeks, but has been very happy that he will have income, and more importantly, Medicaid to attend to his medical needs. For example, he is unable to drive because of bad  eyesight and will not get his driver's license without appropriate glasses. He, however, has to pay over $1500 to get his driver's license back. He believes that he lost it due to DUIs, but also bad eyesight.   REVIEW OF SYSTEMS: CONSTITUTIONAL: No fevers or chills. Positive for gradual weight gain, probably from ascites.  EYES: No double or blurred vision but complains of poor eyesight.  ENT: No hearing loss.  RESPIRATORY: No shortness of breath or cough.  CARDIOVASCULAR: No chest pain or orthopnea.  GASTROINTESTINAL: Positive for abdominal pain and distention,  GENITOURINARY: No incontinence or frequency.  ENDOCRINE: No heat or cold intolerance.  LYMPHATIC: No anemia or easy bruising.  INTEGUMENTARY: No acne or rash.  MUSCULOSKELETAL: No muscle or joint pain.  NEUROLOGIC: No tingling or weakness.  PSYCHIATRIC: See history of present illness for details.   PHYSICAL EXAMINATION: VITAL SIGNS: Blood pressure 122/68, pulse 116, respirations 16, temperature 98.6.  GENERAL: This is a middle-aged male in no acute distress. The rest of the physical examination is deferred to his primary attending.   LABORATORY DATA: Chemistries are within normal limits except for blood glucose of 108, BUN 22, sodium 128, blood alcohol level not tested. LFTs within normal limits. CBC within normal limits except for white blood count of 12.6. Urinalysis is not suggestive of urinary tract infection.   MENTAL STATUS EXAMINATION: The patient is alert and oriented to person, place, time and situation. He is pleasant, polite and cooperative. He is marginally groomed and he maintains  good eye contact. His speech is of normal rhythm, rate and volume, rather soft. Mood is worried with anxious affect. Thought process is logical and goal oriented. Thought content: He denies suicidal or homicidal ideation. I was asked to assess him because apparently he disclosed some suicidal thinking during one of the assessments. He denies it  now, pointing out that he just wants his disability and has new options in life. He is not delusional or paranoid. There are no auditory or visual hallucinations. His cognition is grossly intact. He registers 3 out of 3 and recalls 3 out of 3 objects after 5 minutes. He can spell "world" forwards and backwards. He knows the current president. He is a fairly good historian. His short and long-term memory seems intact. He is of average to below average intelligence and normal fund of knowledge. His insight and judgment are questionable.   DIAGNOSES: AXIS I: Alcohol dependence, substance-induced mood disorder.  AXIS II: Deferred.  AXIS III: Alcoholic liver disease, ascites, and hypertension.  AXIS IV: Substance abuse, access to care, primary support, financial.  AXIS V: Global assessment of functioning 45.   PLAN:  The patient is interested in treatment of alcoholism.  It is still unclear whether or not he will go medical. We will provide information on [facility], outpatient substance abuse program available to insured patients. We discussed the fact that with new (Dictation Anomaly) <<MISSING TEST>> other options will be available to him. He was encouraged to abstain from drinking as his general health is deteriorating and is also brings on depression I did not start an antidepressant at present since his disposition is unclear. If he goes to [facility], he can get treatment for substance abuse and depression as well.   ____________________________ Ellin GoodieJolanta B. Jennet MaduroPucilowska, MD jbp:dw D: 06/03/2013 20:25:01 ET T: 06/03/2013 21:07:40 ET JOB#: [id number]  cc: Al Gagen B. Jennet MaduroPucilowska, MD, <Dictator> Shari ProwsJOLANTA B Esker Dever MD ELECTRONICALLY SIGNED 06/08/2013 7:32
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
## 2014-07-16 NOTE — Consult Note (Signed)
PATIENT NAME:  [name], [name] MR#:  [id number] DATE OF BIRTH:  January 20, 1966  DATE OF CONSULTATION:  05/30/2014  CONSULTING PHYSICIAN:  Audery AmelJohn T. Amier Hoyt, MD  IDENTIFYING INFORMATION AND REASON FOR CONSULT:  This is a 49 year old man with a history of depression and anxiety, who came into the hospital, referred from his doctor's office.   CHIEF COMPLAINT: "Everything and anything."   HISTORY OF PRESENT ILLNESS: The patient is not a terribly a good historian in today. He is quite agitated.  He says that after his discharge 6 days ago he has been feeling anxious. He talks about how, if he had a gun he would blow his brains out. When he talks about his symptoms however, he mainly is talking about pain. He says he has pain in multiple joints all over his body. He says he stopped all the medicines that he was given at the time of discharge because they were all "working against him."  He has not taken any medicine in a couple of days. Not sleeping well. Feeling agitated. Talked about having panic attacks, but cannot really describe them very coherently. He denies that he has been abusing drugs or alcohol. Says he has been staying with a family member. He does not report any specific new stress.   PAST PSYCHIATRIC HISTORY: The patient was just discharged about 6 days ago. Diagnosis of major depression and panic attacks. The patient had a lot of behavior at that time of medication seeking and dramatic presentation of symptoms. He appears to be very Education officer, environmental(Dictation Anomaly) << MISSING TEXT>> to benzodiazepines. He says he has had one prior suicide attempt, it was in 1977.   PAST MEDICAL HISTORY: The patient has high blood pressure, anemia, has complaints of chronic pain, and gastric reflux symptoms.   FAMILY HISTORY: He is not able to articulate any.   SOCIAL HISTORY: Evidently he has been living with some family member, although it is a little confusing. He was discharged to stay at [facility]. Not working.    SUBSTANCE ABUSE HISTORY: Denied he has been abusing any alcohol or drugs recently.   CURRENT MEDICATIONS: He tells me that he stopped taking everything he was on when he was discharged last time. He says he came to the Emergency Room actually late last night, and got some medicine, but did not bother to get it filled.  It does not sound like he has necessarily been taking anything.   ALLERGIES: No known drug allergies.   REVIEW OF SYSTEMS: Complains of pain in every joint in his body. Complains of feeling anxious and agitated, not sleeping well. Talks about suicidal thoughts.   MENTAL STATUS EXAMINATION: Very demonstrative, dramatic somewhat histrionic presentation. Eye contact good. Psychomotor activity fidgety. Speech is loud at times, slightly pressured. Affect is alternatively, pleading and angry. Thoughts a little bit chaotic, no obvious delusions. Talks very dramatically about blowing his brains out with a gun. No homicidal ideation. He is alert and oriented x4. Can repeat 3 words immediately, remembers one of them at 3 minutes. Judgment and insight appears to be probably somewhat impaired.   LABORATORY RESULTS: Salicylates, acetaminophen and alcohol or all negative. Chemistry panel: Elevated total protein 8.2, glucose elevated at 102. CBC normal. Urinalysis unremarkable. Drug screen is positive for opiates and benzodiazepines. When he was discharged he was not on any opiates and he was only on a low dose of Klonopin, which he usually does not even show up on the benzodiazepine screen.   VITAL SIGNS:  Blood pressure 139/69, respirations 20, pulse 119, and temperature 98.2.   ASSESSMENT: This is a 49 year old man with a history of depression and anxiety but also with some labile presentations. Possible indication of substance issues.  He presents very dramatically this time insisting that he will kill himself if he has a gun, although it should be noted that he does not have a gun. The patient  is rather agitated, and mostly focused on his physical problems. Unclear if he would really benefit from psychiatric hospitalization.   TREATMENT PLAN: The patient will not be prioritized for admission. We will continue monitoring while restarting the medicines that he was taking while he was in the hospital last time. He will be reassessed tomorrow.  Continue treatment and re-evaluate in the morning.   DIAGNOSIS, PRINCIPAL AND PRIMARY:  AXIS I: Major depression, severe, recurrent.   SECONDARY DIAGNOSES:  AXIS I: Panic disorder not otherwise specified.  AXIS II: High blood pressure, reports of chronic pain.    ____________________________ Audery Amel, MD jtc:at D: 05/30/2014 18:44:33 ET T: 05/30/2014 19:13:05 ET JOB#: [id number]  cc: Audery Amel, MD, <Dictator> Audery Amel MD ELECTRONICALLY SIGNED 06/02/2014 10:14

## 2014-07-16 NOTE — Consult Note (Signed)
Brief Consult Note: Diagnosis: depression nos.   Patient was seen by consultant.   Consult note dictated.   Orders entered.   Comments: Psychiatry: Patient seen and chart reviewed. Patient has multipleand rather incoherant complaits. States he will blow his head off with a gun. UDS pos opiates. Possibly abusing drugs. Will give acute sedation and continue evaluation.  Electronic Signatures: Jaydin Boniface, Jackquline DenmarkJohn T (MD)  (Signed 15-Mar-16 16:03)  Authored: Brief Consult Note   Last Updated: 15-Mar-16 16:03 by Audery Amellapacs, Shereen Marton T (MD)
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
## 2014-09-21 ENCOUNTER — Other Ambulatory Visit: Payer: Self-pay | Admitting: Psychiatry

## 2014-10-11 ENCOUNTER — Inpatient Hospital Stay: Payer: Medicaid Other | Attending: Oncology | Admitting: Oncology

## 2014-10-24 ENCOUNTER — Inpatient Hospital Stay: Payer: Medicaid Other | Attending: Oncology | Admitting: Oncology

## 2014-12-25 ENCOUNTER — Inpatient Hospital Stay: Payer: Medicaid Other | Admitting: Oncology

## 2014-12-29 ENCOUNTER — Emergency Department
Admission: EM | Admit: 2014-12-29 | Discharge: 2014-12-30 | Disposition: A | Discharge status: Home / Self Care | Payer: Medicaid Other | Attending: Emergency Medicine | Admitting: Emergency Medicine

## 2014-12-29 ENCOUNTER — Emergency Department: Payer: Medicaid Other

## 2014-12-29 ENCOUNTER — Other Ambulatory Visit: Payer: Self-pay

## 2014-12-29 ENCOUNTER — Encounter: Payer: Self-pay | Admitting: Emergency Medicine

## 2014-12-29 DIAGNOSIS — Z720 Tobacco use: Secondary | ICD-10-CM | POA: Diagnosis not present

## 2014-12-29 DIAGNOSIS — R1010 Upper abdominal pain, unspecified: Secondary | ICD-10-CM | POA: Diagnosis not present

## 2014-12-29 DIAGNOSIS — R0789 Other chest pain: Secondary | ICD-10-CM | POA: Diagnosis not present

## 2014-12-29 DIAGNOSIS — Z79899 Other long term (current) drug therapy: Secondary | ICD-10-CM | POA: Diagnosis not present

## 2014-12-29 DIAGNOSIS — G8918 Other acute postprocedural pain: Secondary | ICD-10-CM | POA: Insufficient documentation

## 2014-12-29 DIAGNOSIS — R079 Chest pain, unspecified: Secondary | ICD-10-CM | POA: Diagnosis present

## 2014-12-29 HISTORY — DX: Esophageal obstruction: K22.2

## 2014-12-29 HISTORY — DX: Bipolar disorder, unspecified: F31.9

## 2014-12-29 HISTORY — DX: Other chronic pain: G89.29

## 2014-12-29 HISTORY — DX: Personal history of other diseases of the nervous system and sense organs: Z86.69

## 2014-12-29 HISTORY — DX: Gastro-esophageal reflux disease without esophagitis: K21.9

## 2014-12-29 HISTORY — DX: Other psychoactive substance abuse, uncomplicated: F19.10

## 2014-12-29 HISTORY — DX: Anxiety disorder, unspecified: F41.9

## 2014-12-29 LAB — COMPREHENSIVE METABOLIC PANEL
ALK PHOS: 73 U/L (ref 38–126)
ALT: 15 U/L — ABNORMAL LOW (ref 17–63)
ANION GAP: 7 (ref 5–15)
AST: 20 U/L (ref 15–41)
Albumin: 3.7 g/dL (ref 3.5–5.0)
BILIRUBIN TOTAL: 0.4 mg/dL (ref 0.3–1.2)
BUN: 12 mg/dL (ref 6–20)
CO2: 29 mmol/L (ref 22–32)
CREATININE: 0.93 mg/dL (ref 0.61–1.24)
Calcium: 9.1 mg/dL (ref 8.9–10.3)
Chloride: 101 mmol/L (ref 101–111)
GLUCOSE: 133 mg/dL — AB (ref 65–99)
Potassium: 4 mmol/L (ref 3.5–5.1)
Sodium: 137 mmol/L (ref 135–145)
Total Protein: 7.4 g/dL (ref 6.5–8.1)

## 2014-12-29 LAB — CBC
HEMATOCRIT: 45.8 % (ref 40.0–52.0)
HEMOGLOBIN: 15.4 g/dL (ref 13.0–18.0)
MCH: 30.7 pg (ref 26.0–34.0)
MCHC: 33.5 g/dL (ref 32.0–36.0)
MCV: 91.5 fL (ref 80.0–100.0)
Platelets: 171 10*3/uL (ref 150–440)
RBC: 5.01 MIL/uL (ref 4.40–5.90)
RDW: 15.9 % — ABNORMAL HIGH (ref 11.5–14.5)
WBC: 8.7 10*3/uL (ref 3.8–10.6)

## 2014-12-29 LAB — URINE DRUG SCREEN, QUALITATIVE (ARMC ONLY)
AMPHETAMINES, UR SCREEN: NOT DETECTED
BENZODIAZEPINE, UR SCRN: NOT DETECTED
Barbiturates, Ur Screen: NOT DETECTED
COCAINE METABOLITE, UR ~~LOC~~: NOT DETECTED
Cannabinoid 50 Ng, Ur ~~LOC~~: NOT DETECTED
MDMA (ECSTASY) UR SCREEN: NOT DETECTED
METHADONE SCREEN, URINE: NOT DETECTED
OPIATE, UR SCREEN: NOT DETECTED
PHENCYCLIDINE (PCP) UR S: NOT DETECTED
Tricyclic, Ur Screen: NOT DETECTED

## 2014-12-29 LAB — ETHANOL

## 2014-12-29 LAB — PROTIME-INR
INR: 1.02
PROTHROMBIN TIME: 13.6 s (ref 11.4–15.0)

## 2014-12-29 LAB — APTT: aPTT: 28 seconds (ref 24–36)

## 2014-12-29 LAB — URINALYSIS COMPLETE WITH MICROSCOPIC (ARMC ONLY)
BILIRUBIN URINE: NEGATIVE
Bacteria, UA: NONE SEEN
GLUCOSE, UA: NEGATIVE mg/dL
Hgb urine dipstick: NEGATIVE
KETONES UR: NEGATIVE mg/dL
LEUKOCYTES UA: NEGATIVE
Nitrite: NEGATIVE
PH: 6 (ref 5.0–8.0)
Protein, ur: NEGATIVE mg/dL
SQUAMOUS EPITHELIAL / LPF: NONE SEEN
Specific Gravity, Urine: 1.018 (ref 1.005–1.030)

## 2014-12-29 LAB — MAGNESIUM: Magnesium: 1.9 mg/dL (ref 1.7–2.4)

## 2014-12-29 LAB — LIPASE, BLOOD: LIPASE: 28 U/L (ref 22–51)

## 2014-12-29 LAB — TROPONIN I

## 2014-12-29 MED ORDER — METOPROLOL SUCCINATE ER 25 MG PO TB24: 25.0000 mg | ORAL_TABLET | Freq: Every day | ORAL | Status: DC

## 2014-12-29 MED ORDER — DOCUSATE SODIUM 100 MG PO CAPS: 100.0000 mg | ORAL_CAPSULE | Freq: Two times a day (BID) | ORAL | Status: DC | PRN

## 2014-12-29 MED ORDER — HYDROCODONE-ACETAMINOPHEN 5-325 MG PO TABS: 1.0000 | ORAL_TABLET | ORAL | Status: DC | PRN

## 2014-12-29 MED ORDER — HYDROCODONE-ACETAMINOPHEN 5-325 MG PO TABS
2.0000 | ORAL_TABLET | Freq: Once | ORAL | Status: AC
  Administered 2014-12-29: 2 via ORAL
  Filled 2014-12-29: qty 2

## 2014-12-29 MED ORDER — GABAPENTIN 300 MG PO CAPS: 300.0000 mg | ORAL_CAPSULE | Freq: Two times a day (BID) | ORAL | Status: DC

## 2014-12-29 MED ORDER — PANTOPRAZOLE SODIUM 40 MG PO TBEC: 40.0000 mg | DELAYED_RELEASE_TABLET | Freq: Every day | ORAL | Status: AC

## 2014-12-29 MED ORDER — DIVALPROEX SODIUM 125 MG PO DR TAB: 125.0000 mg | DELAYED_RELEASE_TABLET | Freq: Every day | ORAL | Status: DC

## 2014-12-29 MED ORDER — CLONAZEPAM 0.5 MG PO TABS: 0.5000 mg | ORAL_TABLET | Freq: Three times a day (TID) | ORAL | Status: DC | PRN

## 2014-12-29 MED ORDER — BUSPIRONE HCL 15 MG PO TABS: 15.0000 mg | ORAL_TABLET | Freq: Three times a day (TID) | ORAL | Status: AC

## 2014-12-29 MED ORDER — FLUOXETINE HCL 20 MG PO CAPS: 20.0000 mg | ORAL_CAPSULE | Freq: Two times a day (BID) | ORAL | Status: DC

## 2014-12-29 MED ORDER — DILTIAZEM HCL 30 MG PO TABS: 30.0000 mg | ORAL_TABLET | Freq: Four times a day (QID) | ORAL | Status: DC

## 2014-12-29 NOTE — ED Notes (Signed)
Xray in room with pt
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
## 2014-12-29 NOTE — ED Notes (Addendum)
Per EMS, pt states he has had intermittent chest pain all day since 8am that gets worse with sitting up.  He states the pain radiates from his central chest to the bottom of an incision he has at the bottom of his stomach.  He also states his stomach pain may be from an umbilical hernia repair he had in January to the best of his recollection.  EMS gave pt 324mg  ASA, 50 mcg Fentanyl, and 2 Nitro SL, and reported pt stated his pain was unrelieved.  EKG by EMS was Sinus Tach w/ no evidence of STE.
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
## 2014-12-30 NOTE — ED Notes (Signed)
Pt unable to call relative for ride home, charge contacted for  Guidance

## 2015-01-01 ENCOUNTER — Emergency Department: Payer: Medicaid Other

## 2015-01-01 ENCOUNTER — Encounter: Payer: Self-pay | Admitting: Emergency Medicine

## 2015-01-01 ENCOUNTER — Emergency Department
Admission: EM | Admit: 2015-01-01 | Discharge: 2015-01-02 | Disposition: A | Discharge status: Other Acute Inpatient Hospital | Payer: Medicaid Other | Attending: Emergency Medicine | Admitting: Emergency Medicine

## 2015-01-01 DIAGNOSIS — R079 Chest pain, unspecified: Secondary | ICD-10-CM | POA: Diagnosis present

## 2015-01-01 DIAGNOSIS — Z7951 Long term (current) use of inhaled steroids: Secondary | ICD-10-CM | POA: Diagnosis not present

## 2015-01-01 DIAGNOSIS — R45851 Suicidal ideations: Secondary | ICD-10-CM | POA: Diagnosis not present

## 2015-01-01 DIAGNOSIS — Z720 Tobacco use: Secondary | ICD-10-CM | POA: Insufficient documentation

## 2015-01-01 DIAGNOSIS — Z79899 Other long term (current) drug therapy: Secondary | ICD-10-CM | POA: Diagnosis not present

## 2015-01-01 DIAGNOSIS — K219 Gastro-esophageal reflux disease without esophagitis: Secondary | ICD-10-CM

## 2015-01-01 DIAGNOSIS — R1084 Generalized abdominal pain: Secondary | ICD-10-CM

## 2015-01-01 DIAGNOSIS — F332 Major depressive disorder, recurrent severe without psychotic features: Secondary | ICD-10-CM

## 2015-01-01 DIAGNOSIS — G8929 Other chronic pain: Secondary | ICD-10-CM | POA: Insufficient documentation

## 2015-01-01 DIAGNOSIS — I10 Essential (primary) hypertension: Secondary | ICD-10-CM

## 2015-01-01 LAB — CBC
HCT: 45.8 % (ref 40.0–52.0)
HEMOGLOBIN: 15.3 g/dL (ref 13.0–18.0)
MCH: 30.8 pg (ref 26.0–34.0)
MCHC: 33.3 g/dL (ref 32.0–36.0)
MCV: 92.4 fL (ref 80.0–100.0)
PLATELETS: 189 10*3/uL (ref 150–440)
RBC: 4.96 MIL/uL (ref 4.40–5.90)
RDW: 15.6 % — ABNORMAL HIGH (ref 11.5–14.5)
WBC: 7.7 10*3/uL (ref 3.8–10.6)

## 2015-01-01 LAB — URINE DRUG SCREEN, QUALITATIVE (ARMC ONLY)
Amphetamines, Ur Screen: NOT DETECTED
BARBITURATES, UR SCREEN: NOT DETECTED
Benzodiazepine, Ur Scrn: NOT DETECTED
CANNABINOID 50 NG, UR ~~LOC~~: NOT DETECTED
COCAINE METABOLITE, UR ~~LOC~~: NOT DETECTED
MDMA (Ecstasy)Ur Screen: NOT DETECTED
Methadone Scn, Ur: NOT DETECTED
Opiate, Ur Screen: NOT DETECTED
Phencyclidine (PCP) Ur S: NOT DETECTED
TRICYCLIC, UR SCREEN: NOT DETECTED

## 2015-01-01 LAB — COMPREHENSIVE METABOLIC PANEL
ALK PHOS: 74 U/L (ref 38–126)
ALT: 15 U/L — AB (ref 17–63)
AST: 22 U/L (ref 15–41)
Albumin: 4 g/dL (ref 3.5–5.0)
Anion gap: 7 (ref 5–15)
BILIRUBIN TOTAL: 0.2 mg/dL — AB (ref 0.3–1.2)
BUN: 12 mg/dL (ref 6–20)
CALCIUM: 9.3 mg/dL (ref 8.9–10.3)
CO2: 30 mmol/L (ref 22–32)
CREATININE: 1.06 mg/dL (ref 0.61–1.24)
Chloride: 102 mmol/L (ref 101–111)
Glucose, Bld: 95 mg/dL (ref 65–99)
Potassium: 4.3 mmol/L (ref 3.5–5.1)
Sodium: 139 mmol/L (ref 135–145)
TOTAL PROTEIN: 8 g/dL (ref 6.5–8.1)

## 2015-01-01 LAB — TROPONIN I

## 2015-01-01 LAB — ETHANOL

## 2015-01-01 LAB — ACETAMINOPHEN LEVEL: Acetaminophen (Tylenol), Serum: <10 ug/mL — ABNORMAL LOW (ref 10–30)

## 2015-01-01 LAB — SALICYLATE LEVEL

## 2015-01-01 LAB — LIPASE, BLOOD: Lipase: 25 U/L (ref 22–51)

## 2015-01-01 MED ORDER — NOREPINEPHRINE BITARTRATE 1 MG/ML IV SOLN: 0.0000 ug/min | INTRAVENOUS | Status: DC

## 2015-01-01 NOTE — ED Notes (Signed)
Pt to ed via crisis rep with reports of suicidal ideation with plans to jump off a bridge. Pt denies any HI. Pt states he has been off meds for two weeks.pt just here on Saturday for chest pain/anxiety.

## 2015-01-01 NOTE — ED Notes (Signed)

## 2015-01-01 NOTE — ED Notes (Signed)
Report received from Amy H., RN. Pt. Alert and oriented in no distress verbalizes having SI; denies HI, AVH and pain.  Pt. Instructed to come to me with problems or concerns.Will continue to monitor for safety via security cameras and Q 15 minute checks. 
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
## 2015-01-01 NOTE — ED Notes (Signed)
Snack and beverage given. 

## 2015-01-01 NOTE — ED Notes (Signed)
Pt. Noted in room resting quietly;. No complaints or concerns voiced. No distress or abnormal behavior noted. Will continue to monitor with security cameras. Q 15 minute rounds continue. 

## 2015-01-01 NOTE — ED Provider Notes (Signed)
[facility] Emergency Department Provider Note  ____________________________________________  Time seen: Approximately 5 PM  I have reviewed the triage vital signs and the nursing notes.   HISTORY  Chief Complaint Suicidal    HPI [name] is a 49 y.o. male history of polysubstance abuse and bipolar disorder who is presenting today with thoughts of driving his scooter into traffic. He says that he has had pain ever since a hernia repair 10 months ago and says he just can't take the pain anymore and wants to kill himself. He denies any homicidal ideation. Denies any self-harm earlier today. Says that his pain is to his abdomen diffusely as well as lower chest. He says it is sharp and constant and not associated with any shortness of breath, nausea vomiting or diarrhea. Was recently evaluated in this emergency department for chest pain several days ago.   Past Medical History  Diagnosis Date  . Polysubstance abuse   . Bipolar disorder (HCC)   . Esophageal stricture   . H/O tonic-clonic seizures   . Anxiety   . GERD (gastroesophageal reflux disease)   . Chronic pain     There are no active problems to display for this patient.   Past Surgical History  Procedure Laterality Date  . Appendectomy      complicated by perforation and abscess  . Peg w/tracheostomy placement    . Peg tube removal    . Tracheostomy closure    . Esophagogastroduodenoscopy (egd) with esophageal dilation    . Hernia repair  Nov 2016    ventral hernia repair at [facility] by Dr. Genene Churn    Current Outpatient Rx  Name  Route  Sig  Dispense  Refill  . atorvastatin (LIPITOR) 10 MG tablet   Oral   Take 10 mg by mouth every evening.         . busPIRone (BUSPAR) 15 MG tablet   Oral   Take 1 tablet (15 mg total) by mouth 3 (three) times daily.   90 tablet   0   . clonazePAM (KLONOPIN) 0.5 MG tablet   Oral   Take 1 tablet (0.5 mg total) by mouth 3 (three) times daily as  needed. Patient taking differently: Take 0.5 mg by mouth 3 (three) times daily as needed for anxiety.    30 tablet   0   . ferrous sulfate 325 (65 FE) MG tablet   Oral   Take 325 mg by mouth daily.         Marland Kitchen FLUoxetine (PROZAC) 20 MG capsule   Oral   Take 1 capsule (20 mg total) by mouth 2 (two) times daily.   60 capsule   0   . fluticasone (FLONASE) 50 MCG/ACT nasal spray   Each Nare   Place 2 sprays into both nostrils daily.         Marland Kitchen gabapentin (NEURONTIN) 300 MG capsule   Oral   Take 1 capsule (300 mg total) by mouth 2 (two) times daily.   60 capsule   0   . HYDROcodone-acetaminophen (NORCO/VICODIN) 5-325 MG tablet   Oral   Take 1-2 tablets by mouth every 4 (four) hours as needed for moderate pain.   15 tablet   0   . loratadine (CLARITIN) 10 MG tablet   Oral   Take 10 mg by mouth daily.         . metoprolol succinate (TOPROL-XL) 50 MG 24 hr tablet   Oral   Take 50  mg by mouth daily.         . pantoprazole (PROTONIX) 40 MG tablet   Oral   Take 1 tablet (40 mg total) by mouth daily.   30 tablet   0   . ranitidine (ZANTAC) 150 MG tablet   Oral   Take 150 mg by mouth 2 (two) times daily.         . diltiazem Marland Kitchen(CARDIZEM) 30 MG tablet   Oral   Take 1 tablet (30 mg total) by mouth 4 (four) times daily.   120 tablet   0   . divalproex (DEPAKOTE) 125 MG DR tablet   Oral   Take 1 tablet (125 mg total) by mouth daily.   30 tablet   0   . docusate sodium (COLACE) 100 MG capsule   Oral   Take 1 capsule (100 mg total) by mouth 2 (two) times daily as needed for mild constipation. Patient not taking: Reported on 01/01/2015   30 capsule   0   . metoprolol succinate (TOPROL-XL) 25 MG 24 hr tablet   Oral   Take 1 tablet (25 mg total) by mouth daily. Patient not taking: Reported on 01/01/2015   30 tablet   0     Allergies Review of patient's allergies indicates no known allergies.  No family history on file.  Social History Social History   Substance Use Topics  . Smoking status: Current Every Day Smoker  . Smokeless tobacco: None  . Alcohol Use: No    Review of Systems Constitutional: No fever/chills Eyes: No visual changes. ENT: No sore throat. Cardiovascular: As above Respiratory: Denies shortness of breath. Gastrointestinal:   No nausea, no vomiting.  No diarrhea.  No constipation. Genitourinary: Negative for dysuria. Musculoskeletal: Negative for back pain. Skin: Negative for rash. Neurological: Negative for headaches, focal weakness or numbness.  10-point ROS otherwise negative.  ____________________________________________   PHYSICAL EXAM:  VITAL SIGNS: ED Triage Vitals  Enc Vitals Group     BP 01/01/15 1541 110/90 mmHg     Pulse Rate 01/01/15 1541 129     Resp 01/01/15 1541 18     Temp 01/01/15 1541 99.1 F (37.3 C)     Temp Source 01/01/15 1541 Oral     SpO2 01/01/15 1541 92 %     Weight 01/01/15 1541 186 lb (84.369 kg)     Height 01/01/15 1541 5\' 7"  (1.702 m)     Head Cir --      Peak Flow --      Pain Score --      Pain Loc --      Pain Edu? --      Excl. in GC? --     Constitutional: Alert and oriented. Well appearing and in no acute distress. Eyes: Conjunctivae are normal. PERRL. EOMI. Head: Atraumatic. Nose: No congestion/rhinnorhea. Mouth/Throat: Mucous membranes are moist.  Oropharynx non-erythematous. Neck: No stridor.   Cardiovascular: Cardiac, regular rhythm. Grossly normal heart sounds.  Good peripheral circulation. Respiratory: Normal respiratory effort.  No retractions. Lungs CTAB. Gastrointestinal: Soft . Diffuse tenderness. There is no guarding or rebound. No distention. No abdominal bruits. No CVA tenderness. Musculoskeletal: No lower extremity tenderness nor edema.  No joint effusions. Neurologic:  Normal speech and language. No gross focal neurologic deficits are appreciated. No gait instability. Skin:  Skin is warm, dry and intact. No rash noted. Psychiatric: Mood  and affect are normal. Speech and behavior are normal.  ____________________________________________   LABS (all labs ordered  are listed, but only abnormal results are displayed)  Labs Reviewed  COMPREHENSIVE METABOLIC PANEL - Abnormal; Notable for the following:    ALT 15 (*)    Total Bilirubin 0.2 (*)    All other components within normal limits  ACETAMINOPHEN LEVEL - Abnormal; Notable for the following:    Acetaminophen (Tylenol), Serum <10 (*)    All other components within normal limits  CBC - Abnormal; Notable for the following:    RDW 15.6 (*)    All other components within normal limits  ETHANOL  SALICYLATE LEVEL  URINE DRUG SCREEN, QUALITATIVE ([facility] ONLY)  LIPASE, BLOOD  TROPONIN I   ____________________________________________  EKG  ED ECG REPORT I, Arelia Longest, the attending physician, personally viewed and interpreted this ECG.   Date: 01/01/2015  EKG Time: 1748  Rate: 110  Rhythm: sinus tachycardia  Axis: Normal axis  Intervals:none  ST&T Change: No ST elevations or depressions. T-wave inversion in V2.  No significant change from the EKG done on October 14.  ____________________________________________  RADIOLOGY  Interstitial pattern suggesting interstitial edema on the chest x-ray. No evidence of bowel structure free air on the abdomen. I personally reviewed these films. ____________________________________________   PROCEDURES   ____________________________________________   INITIAL IMPRESSION / ASSESSMENT AND PLAN / ED COURSE  Pertinent labs & imaging results that were available during my care of the patient were reviewed by me and considered in my medical decision making (see chart for details).  Patient without any distress at this time. Walking around the room and turning adjusting the television. Unclear cause of the abdominal and chest pain. Patient was benign appearance as well as reassuring lab work. Involuntary commitment  completed. ____________________________________________   FINAL CLINICAL IMPRESSION(S) / ED DIAGNOSES  Chronic abdominal and chest pain. Acute suicidal ideation.    Myrna Blazer, MD 01/01/15 [id number]

## 2015-01-02 ENCOUNTER — Inpatient Hospital Stay
Admission: EM | Admit: 2015-01-02 | Discharge: 2015-01-05 | DRG: 885 | Disposition: A | Discharge status: Home / Self Care | Payer: Medicaid Other | Source: Intra-hospital | Attending: Psychiatry | Admitting: Psychiatry

## 2015-01-02 DIAGNOSIS — Z7951 Long term (current) use of inhaled steroids: Secondary | ICD-10-CM

## 2015-01-02 DIAGNOSIS — K219 Gastro-esophageal reflux disease without esophagitis: Secondary | ICD-10-CM

## 2015-01-02 DIAGNOSIS — R45851 Suicidal ideations: Secondary | ICD-10-CM | POA: Diagnosis present

## 2015-01-02 DIAGNOSIS — Z79899 Other long term (current) drug therapy: Secondary | ICD-10-CM

## 2015-01-02 DIAGNOSIS — F410 Panic disorder [episodic paroxysmal anxiety] without agoraphobia: Secondary | ICD-10-CM | POA: Diagnosis present

## 2015-01-02 DIAGNOSIS — F332 Major depressive disorder, recurrent severe without psychotic features: Principal | ICD-10-CM | POA: Diagnosis present

## 2015-01-02 DIAGNOSIS — I10 Essential (primary) hypertension: Secondary | ICD-10-CM | POA: Diagnosis present

## 2015-01-02 DIAGNOSIS — Z9119 Patient's noncompliance with other medical treatment and regimen: Secondary | ICD-10-CM

## 2015-01-02 DIAGNOSIS — G8929 Other chronic pain: Secondary | ICD-10-CM | POA: Diagnosis present

## 2015-01-02 DIAGNOSIS — Z818 Family history of other mental and behavioral disorders: Secondary | ICD-10-CM

## 2015-01-02 DIAGNOSIS — R079 Chest pain, unspecified: Secondary | ICD-10-CM | POA: Diagnosis not present

## 2015-01-02 DIAGNOSIS — F17200 Nicotine dependence, unspecified, uncomplicated: Secondary | ICD-10-CM | POA: Diagnosis present

## 2015-01-02 DIAGNOSIS — Z9049 Acquired absence of other specified parts of digestive tract: Secondary | ICD-10-CM

## 2015-01-02 DIAGNOSIS — E785 Hyperlipidemia, unspecified: Secondary | ICD-10-CM | POA: Diagnosis present

## 2015-01-02 DIAGNOSIS — Z9889 Other specified postprocedural states: Secondary | ICD-10-CM | POA: Diagnosis not present

## 2015-01-02 LAB — LIPID PANEL
CHOL/HDL RATIO: 6.4 ratio
CHOLESTEROL: 180 mg/dL (ref 0–200)
HDL: 28 mg/dL — AB (ref >40)
LDL Cholesterol: 88 mg/dL (ref 0–99)
Triglycerides: 319 mg/dL — ABNORMAL HIGH (ref <150)
VLDL: 64 mg/dL — ABNORMAL HIGH (ref 0–40)

## 2015-01-02 LAB — TSH: TSH: 0.846 u[IU]/mL (ref 0.350–4.500)

## 2015-01-02 MED ORDER — BUSPIRONE HCL 5 MG PO TABS
ORAL_TABLET | ORAL | Status: AC
  Filled 2015-01-02: qty 1

## 2015-01-02 MED ORDER — CLONAZEPAM 1 MG PO TABS
ORAL_TABLET | ORAL | Status: AC
  Administered 2015-01-02: 1 mg via ORAL
  Filled 2015-01-02: qty 1

## 2015-01-02 MED ORDER — CLONAZEPAM 1 MG PO TABS
1.0000 mg | ORAL_TABLET | Freq: Three times a day (TID) | ORAL | Status: DC
  Administered 2015-01-02: 1 mg via ORAL
  Filled 2015-01-02: qty 1

## 2015-01-02 MED ORDER — DILTIAZEM HCL 60 MG PO TABS
60.0000 mg | ORAL_TABLET | Freq: Three times a day (TID) | ORAL | Status: DC
  Administered 2015-01-02 – 2015-01-05 (×8): 60 mg via ORAL
  Filled 2015-01-02 (×11): qty 1

## 2015-01-02 MED ORDER — DILTIAZEM HCL 60 MG PO TABS
60.0000 mg | ORAL_TABLET | Freq: Three times a day (TID) | ORAL | Status: DC
  Administered 2015-01-02: 60 mg via ORAL
  Filled 2015-01-02 (×3): qty 1

## 2015-01-02 MED ORDER — FERROUS SULFATE 325 (65 FE) MG PO TABS
325.0000 mg | ORAL_TABLET | Freq: Every day | ORAL | Status: DC
  Administered 2015-01-03 – 2015-01-05 (×3): 325 mg via ORAL
  Filled 2015-01-02 (×3): qty 1

## 2015-01-02 MED ORDER — OXYCODONE-ACETAMINOPHEN 5-325 MG PO TABS
1.0000 | ORAL_TABLET | Freq: Four times a day (QID) | ORAL | Status: DC | PRN
  Administered 2015-01-02 (×2): 1 via ORAL

## 2015-01-02 MED ORDER — SENNOSIDES-DOCUSATE SODIUM 8.6-50 MG PO TABS
ORAL_TABLET | ORAL | Status: AC
  Administered 2015-01-02: 1
  Filled 2015-01-02: qty 1

## 2015-01-02 MED ORDER — PANTOPRAZOLE SODIUM 40 MG PO TBEC
40.0000 mg | DELAYED_RELEASE_TABLET | Freq: Every day | ORAL | Status: DC
  Administered 2015-01-02: 40 mg via ORAL
  Filled 2015-01-02: qty 1

## 2015-01-02 MED ORDER — SENNA 8.6 MG PO TABS
1.0000 | ORAL_TABLET | Freq: Two times a day (BID) | ORAL | Status: DC
  Administered 2015-01-02 – 2015-01-05 (×6): 8.6 mg via ORAL
  Filled 2015-01-02 (×6): qty 1

## 2015-01-02 MED ORDER — FLUOXETINE HCL 20 MG PO CAPS
40.0000 mg | ORAL_CAPSULE | Freq: Every day | ORAL | Status: DC
  Administered 2015-01-03 – 2015-01-04 (×2): 40 mg via ORAL
  Filled 2015-01-02 (×2): qty 2

## 2015-01-02 MED ORDER — FERROUS SULFATE 325 (65 FE) MG PO TABS
325.0000 mg | ORAL_TABLET | Freq: Every day | ORAL | Status: DC
  Filled 2015-01-02 (×2): qty 1

## 2015-01-02 MED ORDER — GABAPENTIN 300 MG PO CAPS: 300.0000 mg | ORAL_CAPSULE | Freq: Two times a day (BID) | ORAL | Status: DC

## 2015-01-02 MED ORDER — GABAPENTIN 300 MG PO CAPS
300.0000 mg | ORAL_CAPSULE | Freq: Two times a day (BID) | ORAL | Status: DC
  Administered 2015-01-02 – 2015-01-05 (×6): 300 mg via ORAL
  Filled 2015-01-02 (×6): qty 1

## 2015-01-02 MED ORDER — ACETAMINOPHEN 325 MG PO TABS: 650.0000 mg | ORAL_TABLET | Freq: Four times a day (QID) | ORAL | Status: DC | PRN

## 2015-01-02 MED ORDER — ALUM & MAG HYDROXIDE-SIMETH 200-200-20 MG/5ML PO SUSP: 30.0000 mL | ORAL | Status: DC | PRN

## 2015-01-02 MED ORDER — ATORVASTATIN CALCIUM 20 MG PO TABS: 10.0000 mg | ORAL_TABLET | Freq: Every day | ORAL | Status: DC

## 2015-01-02 MED ORDER — BUSPIRONE HCL 5 MG PO TABS
15.0000 mg | ORAL_TABLET | Freq: Three times a day (TID) | ORAL | Status: DC
  Administered 2015-01-02 (×2): 15 mg via ORAL
  Filled 2015-01-02: qty 1

## 2015-01-02 MED ORDER — SENNA 8.6 MG PO TABS
1.0000 | ORAL_TABLET | Freq: Two times a day (BID) | ORAL | Status: DC
  Administered 2015-01-02 (×2): 8.6 mg via ORAL
  Filled 2015-01-02 (×2): qty 1

## 2015-01-02 MED ORDER — CLONAZEPAM 0.5 MG PO TABS: 0.5000 mg | ORAL_TABLET | Freq: Three times a day (TID) | ORAL | Status: DC

## 2015-01-02 MED ORDER — MAGNESIUM HYDROXIDE 400 MG/5ML PO SUSP: 30.0000 mL | Freq: Every day | ORAL | Status: DC | PRN

## 2015-01-02 MED ORDER — ATORVASTATIN CALCIUM 10 MG PO TABS
10.0000 mg | ORAL_TABLET | Freq: Every day | ORAL | Status: DC
  Administered 2015-01-02 – 2015-01-04 (×3): 10 mg via ORAL
  Filled 2015-01-02 (×3): qty 1

## 2015-01-02 MED ORDER — METOPROLOL TARTRATE 50 MG PO TABS
50.0000 mg | ORAL_TABLET | Freq: Two times a day (BID) | ORAL | Status: DC
  Administered 2015-01-02 (×2): 50 mg via ORAL
  Filled 2015-01-02 (×3): qty 1

## 2015-01-02 MED ORDER — METOPROLOL TARTRATE 25 MG PO TABS
ORAL_TABLET | ORAL | Status: AC
  Administered 2015-01-02: 50 mg via ORAL
  Filled 2015-01-02: qty 2

## 2015-01-02 MED ORDER — OXYCODONE-ACETAMINOPHEN 5-325 MG PO TABS
ORAL_TABLET | ORAL | Status: AC
  Administered 2015-01-02: 1 via ORAL
  Filled 2015-01-02: qty 1

## 2015-01-02 MED ORDER — CLONAZEPAM 0.5 MG PO TABS
0.5000 mg | ORAL_TABLET | Freq: Three times a day (TID) | ORAL | Status: DC
  Administered 2015-01-02 – 2015-01-05 (×8): 0.5 mg via ORAL
  Filled 2015-01-02 (×8): qty 1

## 2015-01-02 MED ORDER — ACETAMINOPHEN 325 MG PO TABS: 650.0000 mg | ORAL_TABLET | ORAL | Status: DC | PRN

## 2015-01-02 MED ORDER — PANTOPRAZOLE SODIUM 40 MG PO TBEC
40.0000 mg | DELAYED_RELEASE_TABLET | Freq: Every day | ORAL | Status: DC
  Administered 2015-01-03 – 2015-01-05 (×3): 40 mg via ORAL
  Filled 2015-01-02 (×3): qty 1

## 2015-01-02 MED ORDER — CLONAZEPAM 0.5 MG PO TABS
100.0000 mg | ORAL_TABLET | Freq: Two times a day (BID) | ORAL | Status: DC
  Administered 2015-01-02: 1 mg via ORAL

## 2015-01-02 MED ORDER — BUSPIRONE HCL 5 MG PO TABS
15.0000 mg | ORAL_TABLET | Freq: Three times a day (TID) | ORAL | Status: DC
  Administered 2015-01-02 – 2015-01-05 (×8): 15 mg via ORAL
  Filled 2015-01-02 (×9): qty 1

## 2015-01-02 MED ORDER — DILTIAZEM HCL 30 MG PO TABS
ORAL_TABLET | ORAL | Status: AC
  Administered 2015-01-02: 60 mg via ORAL
  Filled 2015-01-02: qty 2

## 2015-01-02 MED ORDER — METOPROLOL TARTRATE 25 MG PO TABS
50.0000 mg | ORAL_TABLET | Freq: Two times a day (BID) | ORAL | Status: DC
  Administered 2015-01-02 – 2015-01-05 (×5): 50 mg via ORAL
  Filled 2015-01-02 (×5): qty 2

## 2015-01-02 MED ORDER — FLUOXETINE HCL 20 MG PO CAPS
40.0000 mg | ORAL_CAPSULE | Freq: Every day | ORAL | Status: DC
  Administered 2015-01-02: 40 mg via ORAL
  Filled 2015-01-02: qty 2

## 2015-01-02 MED ORDER — BUSPIRONE HCL 10 MG PO TABS
ORAL_TABLET | ORAL | Status: AC
  Filled 2015-01-02: qty 1

## 2015-01-02 NOTE — ED Notes (Signed)
Breakfast provided   Patient observed lying in bed with eyes closed  Even, unlabored respirations observed   NAD pt appears to be sleeping  I will continue to monitor along with every 15 minute visual observations and ongoing security camera monitoring    

## 2015-01-02 NOTE — ED Notes (Signed)
Pt. Noted in room resting quietly;. No complaints or concerns voiced. No distress or abnormal behavior noted. Will continue to monitor with security cameras. Q 15 minute rounds continue. 

## 2015-01-02 NOTE — Progress Notes (Signed)
[facility] by Dr. Toni Amendlapacs. Attending Physician will be Dr. Jennet MaduroPucilowska. Pt[facility]. Intake Paper Work has been signed and placed on pt. chart. ER staff Olegario Messier( [facility] Sect.; Dr. Sharma CovertNorman, ER MD; Amy Patient's Nurse & Renee Patient Access) have been made aware of the admission.   01/02/2015 Cheryl FlashNicole Theus Espin, MS, NCC, LPCA Therapeutic Triage Specialist

## 2015-01-02 NOTE — ED Notes (Signed)
Pt. Noted in  room watching the tv.;. No complaints or concerns voiced. No distress or abnormal behavior noted. Will continue to monitor with security cameras. Q 15 minute rounds continue. 

## 2015-01-02 NOTE — ED Notes (Signed)
BEHAVIORAL HEALTH ROUNDING Patient sleeping: Yes.   Patient alert and oriented: eyes closed  Appears asleep Behavior appropriate: Yes.  ; If no, describe:  Nutrition and fluids offered: Yes  Toileting and hygiene offered: sleeping Sitter present: q 15 minute observations and security camera monitoring Law enforcement present: yes  ODS 

## 2015-01-02 NOTE — ED Notes (Signed)
ED BHU PLACEMENT JUSTIFICATION Is the patient under IVC or is there intent for IVC: Yes.   Is the patient medically cleared: Yes.   Is there vacancy in the ED BHU: Yes.   Is the population mix appropriate for patient: Yes.   Is the patient awaiting placement in inpatient or outpatient setting:  Has the patient had a psychiatric consult: consult pending  Survey of unit performed for contraband, proper placement and condition of furniture, tampering with fixtures in bathroom, shower, and each patient room: Yes.  ; Findings:  APPEARANCE/BEHAVIOR Calm and cooperative NEURO ASSESSMENT Orientation: oriented x3  Denies pain Hallucinations: No.None noted (Hallucinations) Speech: Normal Gait: normal RESPIRATORY ASSESSMENT Even  Unlabored respirations  CARDIOVASCULAR ASSESSMENT Pulses equal   regular rate  Skin warm and dry   GASTROINTESTINAL ASSESSMENT no GI complaint EXTREMITIES Full ROM  PLAN OF CARE Provide calm/safe environment. Vital signs assessed twice daily. ED BHU Assessment once each 12-hour shift. Collaborate with intake RN daily or as condition indicates. Assure the ED provider has rounded once each shift. Provide and encourage hygiene. Provide redirection as needed. Assess for escalating behavior; address immediately and inform ED provider.  Assess family dynamic and appropriateness for visitation as needed: Yes.  ; If necessary, describe findings:  Educate the patient/family about BHU procedures/visitation: Yes.  ; If necessary, describe findings:   

## 2015-01-02 NOTE — ED Notes (Signed)
MD Clapacs has consulted with him and he will be admitted to [facility] once bed/staff become available

## 2015-01-02 NOTE — ED Notes (Signed)
Patient given lunch tray. Resting in room at this time. 
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
## 2015-01-02 NOTE — ED Notes (Signed)
Report received from Demetrios IsaacsAmy H., RN. Pt. Alert and oriented in no distress verbalizes having SI; denies having  HI, AVH and c/o back pain.  Pt. Instructed to come to me with problems or concerns.Will continue to monitor for safety via security cameras and Q 15 minute checks.

## 2015-01-02 NOTE — ED Provider Notes (Signed)
-----------------------------------------   7:24 AM on 01/02/2015 -----------------------------------------   Blood pressure 122/78, pulse 72, temperature 98.6 F (37 C), temperature source Oral, resp. rate 18, height 5\' 7"  (1.702 m), weight 186 lb (84.369 kg), SpO2 93 %.  The patient had no acute events since last update.  Calm and cooperative at this time.  Disposition is pending per Psychiatry/Behavioral Medicine team recommendations.  There were no acute episodes overnight. The patient has oxygen saturations between 90-93% which are stable, and he is ambulating without difficulty. We'll continue to monitor his oxygenation, but at this time there is no acute illness.   Rockne MenghiniAnne-Caroline Brannon Levene, MD 01/02/15 [phone]

## 2015-01-02 NOTE — ED Notes (Signed)
Client received medications without difficulty.

## 2015-01-02 NOTE — ED Notes (Signed)
Med orders received from Dr. Zenda AlpersWebster.

## 2015-01-02 NOTE — ED Notes (Signed)
BEHAVIORAL HEALTH ROUNDING Patient sleeping: No. Patient alert and oriented: yes Behavior appropriate: Yes.  ; If no, describe:  Nutrition and fluids offered: yes Toileting and hygiene offered: Yes  Sitter present: q15 minute observations and security camera monitoring Law enforcement present: Yes  ODS  

## 2015-01-02 NOTE — ED Notes (Signed)
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
## 2015-01-02 NOTE — Tx Team (Signed)
Initial Interdisciplinary Treatment Plan   PATIENT STRESSORS: Financial difficulties Health problems Medication change or noncompliance   PATIENT STRENGTHS: Capable of independent living Communication skills   PROBLEM LIST: Problem List/Patient Goals Date to be addressed Date deferred Reason deferred Estimated date of resolution  Bipolar 10/18           Depression  10/18           Anxiety  10/18                              DISCHARGE CRITERIA:  Ability to meet basic life and health needs Adequate post-discharge living arrangements Motivation to continue treatment in a less acute level of care  PRELIMINARY DISCHARGE PLAN: Attend aftercare/continuing care group Outpatient therapy Placement in alternative living arrangements  PATIENT/FAMIILY INVOLVEMENT: This treatment plan has been presented to and reviewed with the patient, [name], and/or family member, .  The patient and family have been given the opportunity to ask questions and make suggestions.  [name] 01/02/2015, 5:44 PM

## 2015-01-02 NOTE — ED Notes (Signed)
Snack and beverage given. 

## 2015-01-02 NOTE — ED Notes (Signed)
Report called to [facility]  Victorino DikeJennifer RN   2/2 bags of belongings to transfer with him   Pt observed with no unusual behavior  Appropriate to stimulation  No verbalized needs or concerns at this time  NAD assessed  Continue to monitor

## 2015-01-02 NOTE — ED Notes (Signed)
Patient observed lying in bed with eyes closed  Even, unlabored respirations observed   NAD pt appears to be sleeping  I will continue to monitor along with every 15 minute visual observations and ongoing security camera monitoring    

## 2015-01-02 NOTE — ED Notes (Signed)
Am meds administered as ordered   Assessment completed  10/10 left sided pain reported  Continue to monitor

## 2015-01-02 NOTE — ED Notes (Signed)
Pt is taking a shower this A.M.

## 2015-01-02 NOTE — Plan of Care (Signed)
Problem: Ineffective individual coping Goal: LTG: Patient will report a decrease in negative feelings Outcome: Progressing o SI/HI at this time.

## 2015-01-03 ENCOUNTER — Encounter: Payer: Self-pay | Admitting: Psychiatry

## 2015-01-03 ENCOUNTER — Inpatient Hospital Stay: Payer: Medicaid Other | Attending: Oncology | Admitting: Oncology

## 2015-01-03 DIAGNOSIS — F332 Major depressive disorder, recurrent severe without psychotic features: Principal | ICD-10-CM

## 2015-01-03 DIAGNOSIS — E785 Hyperlipidemia, unspecified: Secondary | ICD-10-CM | POA: Diagnosis present

## 2015-01-03 DIAGNOSIS — F17200 Nicotine dependence, unspecified, uncomplicated: Secondary | ICD-10-CM | POA: Diagnosis present

## 2015-01-03 LAB — HEMOGLOBIN A1C: HEMOGLOBIN A1C: 6 % (ref 4.0–6.0)

## 2015-01-03 MED ORDER — NICOTINE 21 MG/24HR TD PT24
21.0000 mg | MEDICATED_PATCH | Freq: Every day | TRANSDERMAL | Status: DC
Start: 2015-01-03 — End: 2015-01-05
  Administered 2015-01-04 – 2015-01-05 (×2): 21 mg via TRANSDERMAL
  Filled 2015-01-03 (×2): qty 1
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
## 2015-01-03 NOTE — [facility] Group Notes (Signed)
[facility] LCSW Aftercare Discharge Planning Group Note   01/03/2015 1:25 PM  Patient did not attend.  Jasmine Lewis, Clinical Social Work Intern 01/03/15  Destony Prevost, MSW, LCSWA 01/04/15 

## 2015-01-03 NOTE — Progress Notes (Signed)
Patient with depressed affect and cooperative behavior with meals, (good appetite) and meds. Not actively participating in plan of care. Fair adls. Minimal interaction with peers. Encouraged to attend therapy groups to learn and initiate coping skills. No SI/HI at this time. Safety maintained.

## 2015-01-03 NOTE — [facility] Suicide Risk Assessment (Signed)
[facility]   Nursing information obtained from:    Demographic factors:    Current Mental Status:    Loss Factors:    Historical Factors:    Risk Reduction Factors:    Total Time spent with patient: 1 hour Principal Problem: <principal problem not specified> Diagnosis:   Patient Active Problem List   Diagnosis Date Noted  . Dyslipidemia [E78.5] 01/03/2015  . Tobacco use disorder [F17.200] 01/03/2015  . Severe episode of recurrent major depressive disorder, without psychotic features (HCC) [F33.2] 01/02/2015  . Hypertension [I10] 01/02/2015  . Gastric reflux [K21.9] 01/02/2015     Continued Clinical Symptoms:  Alcohol Use Disorder Identification Test Final Score (AUDIT): 0 The "Alcohol Use Disorders Identification Test", Guidelines for Use in Primary Care, Second Edition.  World Science writerHealth Organization [facility](WHO). Score between 0-7:  no or low risk or alcohol related problems. Score between 8-15:  moderate risk of alcohol related problems. Score between 16-19:  high risk of alcohol related problems. Score 20 or above:  warrants further diagnostic evaluation for alcohol dependence and treatment.   CLINICAL FACTORS:   Depression:   Severe   Musculoskeletal: Strength & Muscle Tone: within normal limits Gait & Station: normal Patient leans: N/A  Psychiatric Specialty Exam: Physical Exam  Nursing note and vitals reviewed.   Review of Systems  Gastrointestinal: Positive for abdominal pain.  All other systems reviewed and are negative.   Blood pressure 92/53, pulse 65, temperature 98.2 F (36.8 C), temperature source Oral, resp. rate 18, height 5\' 7"  (1.702 m), weight 84.369 kg (186 lb), SpO2 96 %.Body mass index is 29.12 kg/(m^2).  General Appearance: Disheveled  Eye SolicitorContact::  Fair  Speech:  Slow  Volume:  Decreased  Mood:  Hopeless  Affect:  Depressed  Thought Process:  Goal Directed  Orientation:  Full (Time, Place, and Person)  Thought Content:   WDL  Suicidal Thoughts:  Yes.  with intent/plan  Homicidal Thoughts:  No  Memory:  Immediate;   Fair Recent;   Fair Remote;   Fair  Judgement:  Impaired  Insight:  Shallow  Psychomotor Activity:  Decreased  Concentration:  Fair  Recall:  [employer]  Fund of Knowledge:Fair  Language: Fair  Akathisia:  No  Handed:  Right  AIMS (if indicated):     Assets:  Communication Skills Desire for Improvement Physical Health Resilience  Sleep:  Number of Hours: 8  Cognition: WNL  ADL's:  Intact     COGNITIVE FEATURES THAT CONTRIBUTE TO RISK:  None    SUICIDE RISK:   Moderate:  Frequent suicidal ideation with limited intensity, and duration, some specificity in terms of plans, no associated intent, good self-control, limited dysphoria/symptomatology, some risk factors present, and identifiable protective factors, including available and accessible social support.  PLAN OF CARE: hospital admission, medication management, discharge planning.  Medical Decision Making:  New problem, with additional work up planned, Review of Psycho-Social Stressors (1), Review or order clinical lab tests (1), Independent Review of image, tracing or specimen (2) and Review of Medication Regimen & Side Effects (2)   Mr. Thurmond ButtsWade is a [age] year old male with a history of depression and anxiety admitted for suicidal ideation stressors and treatment noncompliance  1. Suicidal ideation. The patient is able to contract for safety in the hospital.  2. Mood. We'll restart Prozac for depression and BuSpar, clonazepam and gabapentin for anxiety.  3. Smoking. Nicotine patch is available.  4. Hypertension. Continue metoprolol and diltiazem.  5. GERD. We will  continue Protonix.  6. Dyslipidemia. We'll continue Lipitor.  7. Disposition. [facility] will follow up with [facility].  I certify that inpatient services furnished can reasonably be expected to improve the patient's condition.    Sharna Gabrys 01/03/2015, 2:31 PM

## 2015-01-03 NOTE — Plan of Care (Signed)
Problem: Alteration in mood Goal: LTG-Patient reports reduction in suicidal thoughts (Patient reports reduction in suicidal thoughts and is able to verbalize a safety plan for whenever patient is feeling suicidal)  Outcome: Progressing No SI/HI at this time.      

## 2015-01-03 NOTE — [facility] Group Notes (Signed)
[facility] Group Notes:  (Nursing/MHT/Case Management/Adjunct)  Date:  01/03/2015  Time:  2:56 PM  Type of Therapy:  Psychoeducational Skills  Participation Level:  Did Not Attend  Lynelle SmokeCara Travis [facility] 01/03/2015, 2:56 PM

## 2015-01-03 NOTE — Progress Notes (Signed)
Recreation Therapy Notes  INPATIENT RECREATION THERAPY ASSESSMENT  Patient Details Name: [name] MRN: [id number] DOB: 09/30/1965 Today's Date: 01/03/2015  Patient Stressors: Family, Death, Other (Comment) (Cousin is stressful; grandfather passed in Dec 2015; "every day life")  Coping Skills:   Isolate, Arguments, Avoidance, Exercise, Art/Dance, Talking, Music, Sports, Other (Comment) (Ride on scooter; ride horses; mow the yard)  Personal Challenges: Anger, Communication, Concentration, Decision-Making, Expressing Yourself, Problem-Solving, Relationships, Self-Esteem/Confidence, Social Interaction, Stress Management, Time Management, Trusting Others  Leisure Interests (2+):  Citigroupature - Therapist, musicishing, Individual - Other (Comment) (Ride horses)  Awareness of Community Resources:  No  Community Resources:     Current Use:    If no, Barriers?:    Patient Strengths:  "No"  Patient Identified Areas of Improvement:  Talk to people rationally and be understood  Current Recreation Participation:  Eating, riding moped  Patient Goal for Hospitalization:  To get medicine right  [place]  County of Residence:  [place]   Current SI (including self-harm):  Yes  Current HI:  No  Consent to Intern Participation: N/A   Jacquelynn CreeGreene,Jerris Keltz M, LRT/CTRS 01/03/2015, 2:48 PM

## 2015-01-03 NOTE — Progress Notes (Signed)
D: Patient denies HI/AVH.  Patient endorses SI but agrees to contract.  Patient affect is appropriate and his mood is depressed.  Patient did NOT attend evening group. Patient remained in his room throughout the shift.  No distress noted. A: Support and encouragement offered. Scheduled medications given to pt. Q 15 min checks continued for patient safety. R: Patient receptive. Patient remains safe on the unit.

## 2015-01-03 NOTE — Progress Notes (Signed)
Recreation Therapy Notes  Date: 10.19.16 Time: 3:00 pm Location: Craft Room  Group Topic: Self-esteem  Goal Area(s) Addresses:  Patient will identify positive attributes about self. Patient will identify at least one healthy coping skill.  Behavioral Response: Did not attend  Intervention: All About Me  Activity: Patients were instructed to make an All About Me pamphlet listing their life's motto, positive traits, healthy coping skills, and their healthy support system.  Education: LRT educated patient on ways they can increase their self-esteem   Education Outcome: Patient did not attend group.   Clinical Observations/Feedback: Patient did not attend group.  Jacquelynn CreeGreene,Hommer Cunliffe M, LRT/CTRS 01/03/2015 4:29 PM

## 2015-01-04 MED ORDER — VENLAFAXINE HCL ER 75 MG PO CP24
150.0000 mg | ORAL_CAPSULE | Freq: Every day | ORAL | Status: DC
  Administered 2015-01-05: 150 mg via ORAL
  Filled 2015-01-04: qty 2

## 2015-01-04 NOTE — [facility] Group Notes (Signed)
[facility] Group Notes:  (Nursing/MHT/Case Management/Adjunct)  Date:  01/04/2015  Time:  3:55 PM  Type of Therapy:  Psychoeducational Skills  Participation Level:  Did Not Attend  Lynelle SmokeCara Travis [facility] 01/04/2015, 3:55 PM

## 2015-01-04 NOTE — Progress Notes (Signed)
D: Pt denies SI/HI/AVH. Pt is pleasant and cooperative.Patient is interacting with peers and staff appropriately.  A: Pt was offered support and encouragement. Pt was given scheduled medications. 15 minute checks were done for safety.  R:Patient  interacts well with peers and staff. Pt is taking medication. Pt has no complaints.Pt receptive to treatment and safety maintained on unit.

## 2015-01-04 NOTE — Progress Notes (Signed)
Patient continues to endorse depression although he says that he is starting to feel a little better.  Stays to himself.  No group attendance.  States that he does not attend groups because he does not like being around people, that causes him to have panic attacks.  No interaction noted with peers.

## 2015-01-04 NOTE — Progress Notes (Signed)
[facility]  01/04/2015 12:50 PM [name]  MRN:  [id number]  Subjective:  [name] reports no improvement. He is still depressed and very anxious. He had only passing thoughts of suicide. He has not been participating in programming and [place] in [place]. He says that he has panic attacks when in class. He accepts medication and tolerates it well but believes that his medication needs be adjusted due to persistent depression and anxiety. He still complains of abdominal pain at times. This is related to being abdominal hernia surgery several years ago. His abdomen is soft and nontender. Abdomen and chest CT scan were unremarkable on admission. He also complains of chest pain today and reports to the spell that is painful to the touch. He still intends to go [facility] following [facility]. Social worker to contact the facility and schedule phone interview.  Principal Problem: Major depressive disorder, recurrent severe without psychotic features (HCC) Diagnosis:   Patient Active Problem List   Diagnosis Date Noted  . Dyslipidemia [E78.5] 01/03/2015  . Tobacco use disorder [F17.200] 01/03/2015  . Major depressive disorder, recurrent severe without psychotic features (HCC) [F33.2] 01/02/2015  . Hypertension [I10] 01/02/2015  . Gastric reflux [K21.9] 01/02/2015   Total Time spent with patient: 20 minutes  Past Psychiatric History: Depression.  Past Medical History:  Past Medical History  Diagnosis Date  . Polysubstance abuse   . Bipolar disorder (HCC)   . Esophageal stricture   . H/O tonic-clonic seizures   . Anxiety   . GERD (gastroesophageal reflux disease)   . Chronic pain     Past Surgical History  Procedure Laterality Date  . Appendectomy      complicated by perforation and abscess  . Peg w/tracheostomy placement    . Peg tube removal    . Tracheostomy closure    . Esophagogastroduodenoscopy (egd) with esophageal dilation    . Hernia repair   Nov 2016    [facility] by Dr[facility] Genene ChurnBunzendahl   Family History: History reviewed. No pertinent family history. Family Psychiatric  History: None reported. Social History:  History  Alcohol Use No     History  Drug Use  . Yes  . Special: Marijuana    Social History   Social History  . Marital Status: Single    Spouse Name: N/A  . Number of Children: N/A  . Years of Education: N/A   Social History Main Topics  . Smoking status: Current Every Day Smoker  . Smokeless tobacco: None  . Alcohol Use: No  . Drug Use: Yes    Special: Marijuana  . Sexual Activity: Not Asked   Other Topics Concern  . None   Social History Narrative   Additional Social History:                         Sleep: Good  Appetite:  Good  Current Medications: Current Facility-Administered Medications  Medication Dose Route Frequency Provider Last Rate Last Dose  . acetaminophen (TYLENOL) tablet 650 mg  650 mg Oral Q4H PRN Audery AmelJohn T Clapacs, MD      . alum & mag hydroxide-simeth (MAALOX/MYLANTA) 200-200-20 MG/5ML suspension 30 mL  30 mL Oral Q4H PRN Audery AmelJohn T Clapacs, MD      . atorvastatin (LIPITOR) tablet 10 mg  10 mg Oral q1800 Audery AmelJohn T Clapacs, MD   10 mg at 01/03/15 1821  . busPIRone (BUSPAR) tablet 15 mg  15 mg Oral TID Audery Amel, MD   15 mg at 01/04/15 4540  . clonazePAM (KLONOPIN) tablet 0.5 mg  0.5 mg Oral TID Audery Amel, MD   0.5 mg at 01/04/15 0958  . diltiazem (CARDIZEM) tablet 60 mg  60 mg Oral TID Audery Amel, MD   60 mg at 01/04/15 0958  . ferrous sulfate tablet 325 mg  325 mg Oral Q breakfast Audery Amel, MD   325 mg at 01/04/15 0958  . FLUoxetine (PROZAC) capsule 40 mg  40 mg Oral Daily Audery Amel, MD   40 mg at 01/04/15 0957  . gabapentin (NEURONTIN) capsule 300 mg  300 mg Oral BID Audery Amel, MD   300 mg at 01/04/15 0957  . magnesium hydroxide (MILK OF MAGNESIA) suspension 30 mL  30 mL Oral Daily PRN Audery Amel, MD      . metoprolol  tartrate (LOPRESSOR) tablet 50 mg  50 mg Oral BID Audery Amel, MD   50 mg at 01/04/15 0957  . nicotine (NICODERM CQ - dosed in mg/24 hours) patch 21 mg  21 mg Transdermal Daily Eddy Termine B Makayli Bracken, MD   21 mg at 01/04/15 0957  . pantoprazole (PROTONIX) EC tablet 40 mg  40 mg Oral QAC breakfast Audery Amel, MD   40 mg at 01/04/15 0958  . senna (SENOKOT) tablet 8.6 mg  1 tablet Oral BID Audery Amel, MD   8.6 mg at 01/04/15 9811    Lab Results:  Results for orders placed or performed during the hospital encounter of 01/02/15 (from the past 48 hour(s))  Hemoglobin A1c     Status: None   Collection Time: 01/02/15  6:20 PM  Result Value Ref Range   Hgb A1c MFr Bld 6.0 4.0 - 6.0 %  Lipid panel, fasting     Status: Abnormal   Collection Time: 01/02/15  6:20 PM  Result Value Ref Range   Cholesterol 180 0 - 200 mg/dL   Triglycerides 914 (H) <150 mg/dL   HDL 28 (L) >78 mg/dL   Total CHOL/HDL Ratio 6.4 RATIO   VLDL 64 (H) 0 - 40 mg/dL   LDL Cholesterol 88 0 - 99 mg/dL    Comment:        Total Cholesterol/HDL:CHD Risk Coronary Heart Disease Risk Table                     Men   Women  1/2 Average Risk   3.4   3.3  Average Risk       5.0   4.4  2 X Average Risk   9.6   7.1  3 X Average Risk  23.4   11.0        Use the calculated Patient Ratio above and the CHD Risk Table to determine the patient's CHD Risk.        ATP III CLASSIFICATION (LDL):  <100     mg/dL   Optimal  295-621  mg/dL   Near or Above                    Optimal  130-159  mg/dL   Borderline  308-657  mg/dL   High  >846     mg/dL   Very High   TSH     Status: None   Collection Time: 01/02/15  6:20 PM  Result Value Ref Range   TSH 0.846 0.350 - 4.500 uIU/mL  Physical Findings: AIMS:  , ,  ,  ,    CIWA:    COWS:     Musculoskeletal: Strength & Muscle Tone: within normal limits Gait & Station: normal Patient leans: N/A  Psychiatric Specialty Exam: Review of Systems  Gastrointestinal: Positive for  abdominal pain.  All other systems reviewed and are negative.   Blood pressure 119/79, pulse 71, temperature 98.5 F (36.9 C), temperature source Oral, resp. rate 18, height  (1.702 m), weight 84.369 kg (186 lb), SpO2 96 %.Body mass index is 29.12 kg/(m^2).  General Appearance: Disheveled  Eye Contact::  Minimal  Speech:  Slow and Slurred  Volume:  Decreased  Mood:  Anxious and Irritable  Affect:  Flat  Thought Process:  Goal Directed  Orientation:  Full (Time, Place, and Person)  Thought Content:  WDL  Suicidal Thoughts:  Yes.  with intent/plan  Homicidal Thoughts:  No  Memory:  Immediate;   Fair Recent;   Fair Remote;   Fair  Judgement:  Intact  Insight:  Shallow  Psychomotor Activity:  Decreased  Concentration:  Fair  Recall:  [employer] of Knowledge:Fair  Language: Fair  Akathisia:  No  Handed:  Right  AIMS (if indicated):     Assets:  Communication Skills Desire for Improvement Resilience Social Support  ADL's:  Intact  Cognition: WNL  Sleep:  Number of Hours: 8   Treatment Plan Summary: Daily contact with patient to assess and evaluate symptoms and progress in treatment and Medication management   Mr. Koska is a 49 year old male with a history of depression and anxiety admitted for suicidal ideation stressors and treatment noncompliance and severe social stressors.  1. Suicidal ideation. Charlett Nose experiences passing thoughts oif suicide. The patient is able to contract for safety in the hospital.  2. Mood. We will stop Prozac and start Effexor to address depression We will continue BuSpar, clonazepam and gabapentin for anxiety.  3. Smoking. Nicotine patch is available.  4. Hypertension. Continue metoprolol and diltiazem.  5. GERD. We will continue Protonix.  6. Dyslipidemia. We'll continue Lipitor.  7. Disposition. [facility] will follow up with [facility].  Tattiana Fakhouri 01/04/2015, 12:50 PM

## 2015-01-04 NOTE — Plan of Care (Signed)
Problem: Alteration in mood Goal: LTG-Patient reports reduction in suicidal thoughts (Patient reports reduction in suicidal thoughts and is able to verbalize a safety plan for whenever patient is feeling suicidal)  Outcome: Progressing Patient denies SI/HI.      

## 2015-01-04 NOTE — Plan of Care (Signed)
Problem: [facility] Participation in Recreation Therapeutic Interventions Goal: STG-Patient will demonstrate improved self esteem by identif STG: Self-Esteem - Within 4 treatment sessions, patient will verablize at least 5 positive affirmation statements in each of 2 treatment sessions to increase self-esteem post d/c.  Outcome: Progressing Treatment Session 1; Completed 1 out of 2: At approximately 9:25 am, LRT met with patient in patient room. Patient verbalized 5 positive affirmation statements. Patient reported it felt "good". LRT encouraged patient to continue saying positive affirmation statements.  Leonette Monarch, LRT/CTRS 10.20.16 12:41 pm Goal: STG-Patient will identify at least five coping skills for ** STG: Coping Skills - Within 4 treatment sessions, patient will verbalize at least 5 coping skills for anger in each of 2 treatment sessions to increase anger management skills post d/c.  Outcome: Progressing Treatment Session 1; Completed 1 out of 2: At approximately 9:25 am, LRT met with patient in patient room. Patient verbalized 5 coping skills for anger. Patient verbalized what triggers him to get angry, how his body responds to anger, and how he is going to remember to use his healthy coping skills. LRT provided suggestions as well.  Leonette Monarch, LRT/CTRS 10.20.16 12:43 pm Goal: STG-Other Recreation Therapy Goal (Specify) STG: Stress Management - Within 4 treatment sessions, patient will verbalize understanding of the stress management techniques in each of 2 treatment sessions to increase stress management skills post d/c.  Outcome: Progressing Treatment Session 1; Completed 1 out of 2: At approximately 9:25 am, LRT met with patient in patient room. LRT educated and provided patient with handouts on stress management techniques. Patient verbalized understanding. LRT encouraged patient to read over and practice the stress management techniques.  Leonette Monarch,  LRT/CTRS 10.20.16 12:45 pm

## 2015-01-04 NOTE — Progress Notes (Signed)
Recreation Therapy Notes  Date: 10.20.16 Time: 3:00 pm Location: Craft Room  Group Topic: Leisure Education  Goal Area(s) Addresses:  Patient will write at least one leisure activity. Patient will write a leisure goal.  Behavioral Response: Did not attend   Intervention: Leisure Bucket List  Activity: Patients were given a leisure bucket list worksheet and instructed to list different leisure activity they had not tried, but wanted to.  Education:LRT educated patients on what is needed to participate in leisure.  Education Outcome: Patient did not attend group.  Clinical Observations/Feedback: Patient did not attend group.  Jacquelynn CreeGreene,Martha Soltys M, LRT/CTRS 01/04/2015 4:18 PM

## 2015-01-04 NOTE — Plan of Care (Signed)
Problem: Alteration in mood Goal: LTG-Pt's behavior demonstrates decreased signs of depression (Patient's behavior demonstrates decreased signs of depression to the point the patient is safe to return home and continue treatment in an outpatient setting)  Outcome: Not Progressing Continues to endorse depression

## 2015-01-04 NOTE — [facility] Group Notes (Signed)
[facility] LCSW Group Therapy  01/04/2015 1:54 PM  Type of Therapy:  Group Therapy  Participation Level:  Did Not Attend  Modes of Intervention:  Discussion, Education, Socialization and Support  Summary of Progress/Problems: Balance in life: Patients will discuss the concept of balance and how it looks and feels to be unbalanced. Pt will identify areas in their life that is unbalanced and ways to become more balanced.    [name] L Roman Dubuc MSW, LCSWA  01/04/2015, 1:54 PM 

## 2015-01-05 MED ORDER — VENLAFAXINE HCL ER 150 MG PO CP24: 150.0000 mg | ORAL_CAPSULE | Freq: Every day | ORAL | Status: DC

## 2015-01-05 NOTE — [facility] Group Notes (Signed)
[facility] LCSW Group Therapy  01/05/2015 3:48 PM  Patient did not attend group.  Jenel LucksJasmine Lewis, Clinical Social Work Intern 01/05/2015, 3:48 PM

## 2015-01-05 NOTE — [facility] Group Notes (Signed)
[facility] Group Notes:  (Nursing/MHT/Case Management/Adjunct)  Date:  01/05/2015  Time:  5:57 AM  Type of Therapy:  Group Therapy  Participation Level:  Active  Participation Quality:  Appropriate and Attentive  Affect:  Appropriate  Cognitive:  Alert and Appropriate  Insight:  Appropriate  Engagement in Group:  Engaged  Modes of Intervention:  Discussion  Summary of Progress/Problems:  [name] 01/05/2015, 5:57 AM
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
## 2015-01-05 NOTE — Progress Notes (Signed)
  [facility] :  Will you be returning to the same living situation after discharge:  Yes,  Homeless At discharge, do you have transportation home?: Yes,  Bus Do you have the ability to pay for your medications: Yes,  Insurance and SSDI   Release of information consent forms completed and in the chart;  Patient's signature needed at discharge.  Patient to Follow up at: Follow-up Information    [facility] .   Why:  Your follow up appointment will be walk in. Walk in hours are Monday - Friday 8:00am-4:00pm. Please take all of your hospital discharge paperwork.    Contact information:   [address] [place], [place] [address] Phone:[phone] or [phone] Fax:[phone]      Patient denies SI/HI: Yes,  Yes     Safety Planning and Suicide Prevention discussed: Yes,  with patient   Have you used any form of tobacco in the last 30 days? (Cigarettes, Smokeless Tobacco, Cigars, and/or Pipes): Yes  Has patient been referred to the Quitline?: Patient refused referral  Rondall AllegraCandace L Delorse Shane MSW, LCSWA  01/05/2015, 3:16 PM

## 2015-01-05 NOTE — Progress Notes (Signed)
Recreation Therapy Notes  INPATIENT RECREATION TR PLAN  Patient Details Name: [name] MRN: [id number] DOB: September 30, 1965 Today's Date: 01/05/2015  Rec Therapy Plan Is patient appropriate for Therapeutic Recreation?: Yes Treatment times per week: At least once a week TR Treatment/Interventions: 1:1 session, Group participation (Comment) (Appropriate participation in daily recreaiton therapy tx)  Discharge Criteria Pt will be discharged from therapy if:: Treatment goals are met, Discharged  Discharge Summary Short term goals set: See Care Plan Short term goals met: Complete Progress toward goals comments: One-to-one attended One-to-one attended: Self-esteem, stress management, anger management Reason goals not met: N/A Therapeutic equipment acquired: None Reason patient discharged from therapy: Discharge from hospital Pt/family agrees with progress & goals achieved: Yes Date patient discharged from therapy: 01/05/15   Leonette Monarch, LRT/CTRS 01/05/2015, 4:38 PM

## 2015-01-05 NOTE — Tx Team (Signed)
Interdisciplinary Treatment Plan Update (Adult)  Date:  01/05/2015 Time Reviewed:  10:42 AM  Progress in Treatment: Attending groups: No. Participating in groups:  No. Taking medication as prescribed:  Yes. Tolerating medication:  Yes. Family/Significant othe contact made:  No, will contact:  Pt refused  Patient understands diagnosis:  Yes. Discussing patient identified problems/goals with staff:  Yes. Medical problems stabilized or resolved:  Yes. Denies suicidal/homicidal ideation: Yes. Issues/concerns per patient self-inventory:  Yes. Other:  New problem(s) identified: No, Describe:  NA  Discharge Plan or Barriers: Pt plans to go to [facility] upon discharge.   Reason for Continuation of Hospitalization: Depression Medication stabilization Suicidal ideation  Comments:[name] reports no improvement. He is still depressed and very anxious. He had only passing thoughts of suicide. He has not been participating in programming and [place] stays in his room. He says that he has panic attacks when in class. He accepts medication and tolerates it well but believes that his medication needs be adjusted due to persistent depression and anxiety. He still complains of abdominal pain at times. This is related to being abdominal hernia surgery several years ago. His abdomen is soft and nontender. Abdomen and chest CT scan were unremarkable on admission. He also complains of chest pain today and reports to the spell that is painful to the touch. He still intends to go to [facility] following discharge. Social worker to contact the facility and schedule phone interview.  Estimated length of stay: 5 days   New goal(s): NA  Review of initial/current patient goals per problem list:    1.  Goal(s): Patient will participate in aftercare plan * Met:  * Target date: at discharge * As evidenced by: Patient will participate within aftercare plan AEB aftercare provider and  housing plan at discharge being identified.   2.  Goal (s): Patient will exhibit decreased depressive symptoms and suicidal ideations. * Met:  *  Target date: at discharge * As evidenced by: Patient will utilize self rating of depression at 3 or below and demonstrate decreased signs of depression or be deemed stable for discharge by MD.  3.  Goal (s): Patient will demonstrate decreased symptoms of psychosis. * Met: No  *  Target date: at discharge * As evidenced by: Patient will not endorse signs of psychosis or be deemed stable for discharge by MD.   Attendees: Patient:  [name] 10/21/201610:42 AM  Family:   10/21/201610:42 AM  Physician:  Dr. Guadlupe Spanish  10/21/201610:42 AM  Nursing:   Silva Bandy, RN  10/21/201610:42 AM  Case Manager:   10/21/201610:42 AM  Counselor:   10/21/201610:42 AM  Other:  Wray Kearns, LCSWA 10/21/201610:42 AM  Other:  Everitt Amber, LRT  10/21/201610:42 AM  Other:   10/21/201610:42 AM  Other:  10/21/201610:42 AM  Other:  10/21/201610:42 AM  Other:  10/21/201610:42 AM  Other:  10/21/201610:42 AM  Other:  10/21/201610:42 AM  Other:  10/21/201610:42 AM  Other:   10/21/201610:42 AM   Scribe for Treatment Team:   Wray Kearns MSW, [place] , 01/05/2015, 10:42 AM
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
## 2015-01-05 NOTE — B[facility] Suicide Risk Assessment (Signed)
[facility]   Demographic Factors:  Male, Caucasian and Low socioeconomic status  Total Time spent with patient: 30 minutes  Musculoskeletal: Strength & Muscle Tone: within normal limits Gait & Station: normal Patient leans: N/A  Psychiatric Specialty Exam: Physical Exam  Nursing note and vitals reviewed.   Review of Systems  Gastrointestinal: Positive for abdominal pain.  All other systems reviewed and are negative.   Blood pressure 116/76, pulse 71, temperature 98.2 F (36.8 C), temperature source Oral, resp. rate 20, height 5\' 7"  (1.702 m), weight 84.369 kg (186 lb), SpO2 95 %.Body mass index is 29.12 kg/(m^2).  General Appearance: Casual  Eye Contact::  Good  Speech:  Clear and Coherent and Normal Rate409  Volume:  Normal  Mood:  Euthymic  Affect:  Appropriate  Thought Process:  Goal Directed  Orientation:  Full (Time, Place, and Person)  Thought Content:  WDL  Suicidal Thoughts:  No  Homicidal Thoughts:  No  Memory:  Immediate;   Fair Recent;   Fair Remote;   Fair  Judgement:  Fair  Insight:  Fair  Psychomotor Activity:  Normal  Concentration:  Fair  Recall:  FiservFair  Fund of Knowledge:Fair  Language: Fair  Akathisia:  No  Handed:  Right  AIMS (if indicated):     Assets:  Communication Skills Desire for Improvement Financial Resources/Insurance Resilience Social Support  Sleep:  Number of Hours: 7.25  Cognition: WNL  ADL's:  Intact   Have you used any form of tobacco in the last 30 days? (Cigarettes, Smokeless Tobacco, Cigars, and/or Pipes): Yes  Has this patient used any form of tobacco in the last 30 days? (Cigarettes, Smokeless Tobacco, Cigars, and/or Pipes) Yes, A prescription for an FDA-approved tobacco cessation medication was offered at discharge and the patient refused  Mental Status Per Nursing Assessment::   On Admission:     Current Mental Status by Physician: NA  Loss Factors: Decline in physical health and  Financial problems/change in socioeconomic status  Historical Factors: Prior suicide attempts and Impulsivity  Risk Reduction Factors:   Sense of responsibility to family and Positive social support  Continued Clinical Symptoms:  Depression:   Impulsivity  Cognitive Features That Contribute To Risk:  None    Suicide Risk:  Minimal: No identifiable suicidal ideation.  Patients presenting with no risk factors but with morbid ruminations; may be classified as minimal risk based on the severity of the depressive symptoms  Principal Problem: Major depressive disorder, recurrent severe without psychotic features [facility](HCC) Discharge Diagnoses:  Patient Active Problem List   Diagnosis Date Noted  . Dyslipidemia [E78.5] 01/03/2015  . Tobacco use disorder [F17.200] 01/03/2015  . Major depressive disorder, recurrent severe without psychotic features (HCC) [F33.2] 01/02/2015  . Hypertension [I10] 01/02/2015  . Gastric reflux [K21.9] 01/02/2015      Plan Of Care/Follow-up recommendations:  Activity:  As tolerated. Diet:  Low sodium heart healthy. Other:  Keep follow-up appointments.  Is patient on multiple antipsychotic therapies at discharge:  No   Has Patient had three or more failed trials of antipsychotic monotherapy by history:  No  Recommended Plan for Multiple Antipsychotic Therapies: NA    Jolanta Pucilowska 01/05/2015, 11:38 AM

## 2015-01-05 NOTE — Progress Notes (Signed)
D: Patient is alert and oriented, denies SI/HI/AVH. Pt is pleasant and cooperative. Patient appears less anxious and he is interacting with peers and staff appropriately.  A: Pt was offered support and encouragement. Pt was given scheduled medications. Pt was encouraged to attend groups. Q 15 minute checks were done for safety.  R:Pt did not attend evening group. Pt is compliant with  medication.Pt receptive to treatment and safety maintained on unit.

## 2015-01-05 NOTE — BHH Suicide Risk Assessment (Signed)
B[facility] INPATIENT:  Family/Significant Other Suicide Prevention Education  Suicide Prevention Education:  Patient Refusal for Family/Significant Other Suicide Prevention Education: The patient [name] has refused to provide written consent for family/significant other to be provided Family/Significant Other Suicide Prevention Education during admission and/or prior to discharge.  Physician notified.  SPE completed with pt and he was encouraged to share information provided in SPI pamphlet with support network, ask questions, and talk about any concerns relating to SPE. Pt denies SI/HI/AVH and verbalized understanding of information presented.    Sempra EnergyCandace L Geniece Akers MSW, LCSWA  01/05/2015, 12:03 PM

## 2015-01-05 NOTE — Plan of Care (Signed)
Problem: Alteration in mood Goal: LTG-Patient reports reduction in suicidal thoughts (Patient reports reduction in suicidal thoughts and is able to verbalize a safety plan for whenever patient is feeling suicidal)  Outcome: Progressing Patient denies SI/HI.      

## 2015-01-05 NOTE — Plan of Care (Signed)
Problem: [facility] Participation in Recreation Therapeutic Interventions Goal: STG-Patient will demonstrate improved self esteem by identif STG: Self-Esteem - Within 4 treatment sessions, patient will verablize at least 5 positive affirmation statements in each of 2 treatment sessions to increase self-esteem post d/c.  Outcome: Completed/Met Date Met:  01/05/15 Treatment Session 2; Completed 2 out of 2: At approximately 12:30 pm, LRT met with patient in patient room. Patient verbalized 5 positive affirmation statements. Patient reported it felt "good". LRT encouraged patient to continue saying positive affirmation statements.  Leonette [facility], LRT/CTRS 10.21.16 2:26 pm Goal: STG-Patient will identify at least five coping skills for ** STG: Coping Skills - Within 4 treatment sessions, patient will verbalize at least 5 coping skills for anger in each of 2 treatment sessions to increase anger management skills post d/c.  Outcome: Completed/Met Date Met:  01/05/15 Treatment Session 2; Completed 2 out of 2: At approximately 12:30 pm, LRT met with patient in patient room. Patient verbalized 5 coping skills for anger. LRT encouraged patient to use his coping skills when he felt himself getting angry to help calm himself down.  Leonette [facility]arch, LRT/CTRS 10.21.16 2:27 pm Goal: STG-Other Recreation Therapy Goal (Specify) STG: Stress Management - Within 4 treatment sessions, patient will verbalize understanding of the stress management techniques in each of 2 treatment sessions to increase stress management skills post d/c.  Outcome: Completed/Met Date Met:  01/05/15 Treatment Session 2; Completed 2 out of 2: At approximately 12:30 pm, LRT met with patient in patient room. LRT explained stress management techniques to patient. Patient verbalized understanding of the stress management techniques.  Leonette [facility]arch, LRT/CTRS 10.21.16 2:29 pm

## 2015-01-05 NOTE — Progress Notes (Signed)
States that he is starting to feel a little better although he does not know what he needs.  He feels that he needs more than what we can give him here. Denies SI/HI/AVH.  Continues to endorse anxiety.  Discharge instructions given, verbalized understanding.  Prescriptions given and personal medications returned.  Personal belongings returned.  Escorted off unit by this Clinical research associatewriter to medical mall entrance to travel to bus stop to catch link bus.

## 2015-01-05 NOTE — BHH Group Notes (Signed)
[facility] Group Notes:  (Nursing/MHT/Case Management/Adjunct)  Date:  01/05/2015  Time:  2:11 PM  Type of Therapy:  Psychoeducational Skills  Participation Level:  Did Not Attend  Lynelle SmokeCara [facility] 01/05/2015, 2:11 PM

## 2015-01-14 ENCOUNTER — Encounter: Payer: Self-pay | Admitting: Emergency Medicine

## 2015-01-14 ENCOUNTER — Emergency Department: Payer: Medicaid Other

## 2015-01-14 ENCOUNTER — Emergency Department
Admission: EM | Admit: 2015-01-14 | Discharge: 2015-01-15 | Disposition: A | Discharge status: Other Acute Inpatient Hospital | Payer: Medicaid Other | Attending: Emergency Medicine | Admitting: Emergency Medicine

## 2015-01-14 DIAGNOSIS — F4321 Adjustment disorder with depressed mood: Secondary | ICD-10-CM | POA: Diagnosis not present

## 2015-01-14 DIAGNOSIS — F332 Major depressive disorder, recurrent severe without psychotic features: Secondary | ICD-10-CM | POA: Diagnosis present

## 2015-01-14 DIAGNOSIS — F17210 Nicotine dependence, cigarettes, uncomplicated: Secondary | ICD-10-CM | POA: Insufficient documentation

## 2015-01-14 DIAGNOSIS — F333 Major depressive disorder, recurrent, severe with psychotic symptoms: Secondary | ICD-10-CM | POA: Diagnosis not present

## 2015-01-14 DIAGNOSIS — F17200 Nicotine dependence, unspecified, uncomplicated: Secondary | ICD-10-CM | POA: Diagnosis present

## 2015-01-14 DIAGNOSIS — Z79899 Other long term (current) drug therapy: Secondary | ICD-10-CM | POA: Diagnosis not present

## 2015-01-14 DIAGNOSIS — I10 Essential (primary) hypertension: Secondary | ICD-10-CM | POA: Diagnosis not present

## 2015-01-14 DIAGNOSIS — R45851 Suicidal ideations: Secondary | ICD-10-CM | POA: Diagnosis present

## 2015-01-14 LAB — URINE DRUG SCREEN, QUALITATIVE (ARMC ONLY)
AMPHETAMINES, UR SCREEN: NOT DETECTED
BARBITURATES, UR SCREEN: NOT DETECTED
BENZODIAZEPINE, UR SCRN: NOT DETECTED
Cannabinoid 50 Ng, Ur ~~LOC~~: NOT DETECTED
Cocaine Metabolite,Ur ~~LOC~~: NOT DETECTED
MDMA (Ecstasy)Ur Screen: NOT DETECTED
METHADONE SCREEN, URINE: NOT DETECTED
OPIATE, UR SCREEN: NOT DETECTED
PHENCYCLIDINE (PCP) UR S: NOT DETECTED
Tricyclic, Ur Screen: NOT DETECTED

## 2015-01-14 LAB — COMPREHENSIVE METABOLIC PANEL
ALK PHOS: 79 U/L (ref 38–126)
ALT: 17 U/L (ref 17–63)
ANION GAP: 8 (ref 5–15)
AST: 20 U/L (ref 15–41)
Albumin: 4 g/dL (ref 3.5–5.0)
BILIRUBIN TOTAL: 0.5 mg/dL (ref 0.3–1.2)
BUN: 11 mg/dL (ref 6–20)
CALCIUM: 9.3 mg/dL (ref 8.9–10.3)
CO2: 31 mmol/L (ref 22–32)
Chloride: 101 mmol/L (ref 101–111)
Creatinine, Ser: 1.16 mg/dL (ref 0.61–1.24)
GLUCOSE: 133 mg/dL — AB (ref 65–99)
POTASSIUM: 3.7 mmol/L (ref 3.5–5.1)
Sodium: 140 mmol/L (ref 135–145)
TOTAL PROTEIN: 8.3 g/dL — AB (ref 6.5–8.1)

## 2015-01-14 LAB — CBC
HEMATOCRIT: 46.7 % (ref 40.0–52.0)
Hemoglobin: 15.2 g/dL (ref 13.0–18.0)
MCH: 29.9 pg (ref 26.0–34.0)
MCHC: 32.5 g/dL (ref 32.0–36.0)
MCV: 91.8 fL (ref 80.0–100.0)
PLATELETS: 180 10*3/uL (ref 150–440)
RBC: 5.08 MIL/uL (ref 4.40–5.90)
RDW: 15.3 % — AB (ref 11.5–14.5)
WBC: 7.3 10*3/uL (ref 3.8–10.6)

## 2015-01-14 LAB — ACETAMINOPHEN LEVEL

## 2015-01-14 LAB — SALICYLATE LEVEL: Salicylate Lvl: <4 mg/dL (ref 2.8–30.0)

## 2015-01-14 LAB — ETHANOL

## 2015-01-14 MED ORDER — RISPERIDONE 1 MG PO TBDP
ORAL_TABLET | ORAL | Status: AC
  Filled 2015-01-14: qty 1

## 2015-01-14 MED ORDER — CITALOPRAM HYDROBROMIDE 20 MG PO TABS
20.0000 mg | ORAL_TABLET | Freq: Every day | ORAL | Status: DC
  Administered 2015-01-14 – 2015-01-15 (×2): 20 mg via ORAL
  Filled 2015-01-14 (×2): qty 1

## 2015-01-14 MED ORDER — TRAZODONE HCL 50 MG PO TABS
50.0000 mg | ORAL_TABLET | Freq: Every day | ORAL | Status: DC
  Administered 2015-01-14: 50 mg via ORAL
  Filled 2015-01-14: qty 1

## 2015-01-14 MED ORDER — RISPERIDONE 1 MG PO TBDP
1.0000 mg | ORAL_TABLET | Freq: Two times a day (BID) | ORAL | Status: DC
  Administered 2015-01-14 – 2015-01-15 (×2): 1 mg via ORAL
  Filled 2015-01-14: qty 1

## 2015-01-14 NOTE — ED Notes (Signed)
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
## 2015-01-14 NOTE — BH Assessment (Signed)
Assessment Note  [name] is an 49 y.o. male. Pt states that he had diarrhea and made a mess all over his  bathroom this morning and his cousin got angry with him. Pt sts him and his cousin started verbally arguing. Pt reports having anxiety and feeling really shaken after the argument. Pt sts his neighbor heard his arguing and brought him to the ED. Pt reports thoughts of harming himself which started during the argument. Pt sts he wanted to run into traffic. Pt continues to have these thoughts presently. Denies feelings of harming others but sts he does not trust himself around his cousin. Pt has 3 previous inpt admissions here [facility]. Pt denies any other drug or alcohol use. Pt self-reports a history of cutting and self-injurious behaviors. Pt reports receiving outpatient services at  [facility]. Pt states that he is compliant with medication request sometimes. Pt states he has an upcoming appointment in November [facility]. Pt endorses experiencing A/V Hallucinations in the form of bells ringing and visions of devils. The patient does meet admission criteria at this time. This was explained to the pt, who voiced understanding.    Diagnosis: Bipolar Disorder   Past Medical History:  Past Medical History  Diagnosis Date  . Polysubstance abuse   . Bipolar disorder (HCC)   . Esophageal stricture   . H/O tonic-clonic seizures   . Anxiety   . GERD (gastroesophageal reflux disease)   . Chronic pain     Past Surgical History  Procedure Laterality Date  . Appendectomy      complicated by perforation and abscess  . Peg w/tracheostomy placement    . Peg tube removal    . Tracheostomy closure    . Esophagogastroduodenoscopy (egd) with esophageal dilation    . Hernia repair  Nov 2016    [facility] Dr[facility] Genene ChurnBunzendahl    Family History: No family history on file.  Social History:  reports that he has been smoking Cigarettes.   He has been smoking about 1.00 pack per day. He does not have any smokeless tobacco history on file. He reports that he uses illicit drugs (Marijuana). He reports that he does not drink alcohol.  Additional Social History:  Alcohol / Drug Use Pain Medications: Denies Prescriptions: Denies Over the Counter: Denies History of alcohol / drug use?: No history of alcohol / drug abuse Negative Consequences of Use:  (N/A) Withdrawal Symptoms:  (N/A)  CIWA: CIWA-Ar BP: (!) 143/78 mmHg Pulse Rate: 78 COWS:    Allergies: No Known Allergies  Home Medications:  (Not in a hospital admission)  OB/GYN Status:  No LMP for male patient.  General Assessment Data [facility] Assessment: In system Is this a Tele or Face-to-Face Assessment?: Face-to-Face Is this an Initial Assessment or a Re-assessment for this encounter?: Initial Assessment Marital status: Single Is patient pregnant?: No Pregnancy Status: No Living Arrangements: Other relatives (Cousin ) Can pt return to current living arrangement?: Yes Admission Status: Involuntary Is patient capable of signing voluntary admission?: No Referral Source: Self/Family/Friend Insurance type: medicaid   Medical Screening Exam [facility] Walk-in ONLY) Medical Exam completed: Yes  Crisis Care Plan Living Arrangements: Other relatives Primary school teacher(Cousin ) Name of Psychiatrist: Yes @ [facility]  Name of Therapist: Yes, Morrie Sheldonshley  (@ [facility])  Education Status Is patient currently in school?: No Highest grade of school patient has completed: 10th  Risk to self  with the past 6 months Suicidal Ideation: Yes-Currently Present Has patient been a risk to self within the past 6 months prior to admission? : No Suicidal Intent: No Has patient had any suicidal intent within the past 6 months prior to admission? : No Is patient at risk for suicide?: Yes Suicidal Plan?: No (UTA) Has patient had any suicidal plan  within the past 6 months prior to admission? : Other (comment) (UTA) Access to Means:  (N/A) Specify Access to Suicidal Means: N/A Previous Attempts/Gestures: Yes How many times?: 3 Other Self Harm Risks: None Reported  Triggers for Past Attempts: Unknown Intentional Self Injurious Behavior: Cutting Comment - Self Injurious Behavior: Pt has  a history of cutting wrist  Family Suicide History: Unable to assess Recent stressful life event(s): Turmoil (Comment) (Family conflict ) Persecutory voices/beliefs?: Yes Depression: Yes Depression Symptoms: Feeling worthless/self pity, Feeling angry/irritable Substance abuse history and/or treatment for substance abuse?: No Suicide prevention information given to non-admitted patients: Yes  Risk to Others within the past 6 months Homicidal Ideation: No Does patient have any lifetime risk of violence toward others beyond the six months prior to admission? : No Thoughts of Harm to Others: No Current Homicidal Intent: No Current Homicidal Plan: No Access to Homicidal Means: No Identified Victim: N/A History of harm to others?: No Assessment of Violence: None Noted Violent Behavior Description: None reported  Does patient have access to weapons?: No Criminal Charges Pending?: No Does patient have a court date: No Is patient on probation?: No  Psychosis Hallucinations: Auditory, Visual, With command Delusions: Unspecified  Mental Status Report Appearance/Hygiene: Disheveled Eye Contact: Poor Motor Activity: Freedom of movement Speech: Logical/coherent Level of Consciousness: Alert Mood: Depressed Affect: Sad, Depressed, Flat Anxiety Level: Minimal Thought Processes: Relevant Judgement: Impaired Orientation: Time, Place, Person, Situation Obsessive Compulsive Thoughts/Behaviors: Minimal  Cognitive Functioning Concentration: Good Memory: Remote Intact, Recent Intact IQ: Average Insight: Fair Impulse Control: Fair Appetite:  Good Sleep: Decreased Total Hours of Sleep: 5  ADLScreening [facility] Assessment Services) Patient's cognitive ability adequate to safely complete daily activities?: Yes Patient able to express need for assistance with ADLs?: Yes Independently performs ADLs?: Yes (appropriate for developmental age)  Prior Inpatient Therapy Prior Inpatient Therapy: Yes Prior Therapy Dates: 2015 Prior Therapy Facilty/Provider(s): [facility] Reason for Treatment: SI  Prior Outpatient Therapy Prior Outpatient Therapy: Yes Prior Therapy Dates: Sept. 2016 Prior Therapy Facilty/Provider(s): [facility]  Reason for Treatment: SI  Does patient have an ACCT team?: No Does patient have Intensive In-House Services?  : No Does patient have [facility] services? : No Does patient have P4CC services?: No  ADL Screening (condition at time of admission) Patient's cognitive ability adequate to safely complete daily activities?: Yes Patient able to express need for assistance with ADLs?: Yes Independently performs ADLs?: Yes (appropriate for developmental age)       Abuse/Neglect Assessment (Assessment to be complete while patient is alone) Physical Abuse: Denies Verbal Abuse: Denies Sexual Abuse: Denies Exploitation of patient/patient's resources: Denies Self-Neglect: Denies Values / Beliefs Cultural Requests During Hospitalization: None Spiritual Requests During Hospitalization: None Consults Spiritual Care Consult Needed: No Social Work Consult Needed: No Merchant navy officer (For Healthcare) Does patient have an advance directive?: No    Additional Information 1:1 In Past 12 Months?: No CIRT Risk: No Elopement Risk: No Does patient have medical clearance?: Yes     Disposition:  Disposition Initial Assessment Completed for this Encounter: Yes Disposition of Patient: Inpatient treatment program  On Site Evaluation by:  Reviewed with Physician:    Asa Saunas 01/14/2015 7:05 PM

## 2015-01-14 NOTE — ED Provider Notes (Signed)
-----------------------------------------   6:03 PM on 01/14/2015 -----------------------------------------  Case discussed with Dr. Guss Bundehalla who will admit the patient  Justin DossEryka A Jaaliyah Lucatero, MD 01/14/15 [phone]

## 2015-01-14 NOTE — ED Notes (Signed)
BEHAVIORAL HEALTH ROUNDING Patient sleeping: Yes.   Patient alert and oriented: not applicable SLEEPING Behavior appropriate: Yes.  ; If no, describe: SLEEPING Nutrition and fluids offered: No SLEEPING Toileting and hygiene offered: NoSLEEPING Sitter present: not applicable Law enforcement present: Yes [facility] 

## 2015-01-14 NOTE — ED Notes (Signed)
Patient assigned to appropriate care area. Patient oriented to unit/care area: Informed that, for their safety, care areas are designed for safety and monitored by security cameras at all times; and visiting hours explained to patient. Patient verbalizes understanding, and verbal contract for safety obtained. 

## 2015-01-14 NOTE — ED Notes (Signed)
Pt sts he made a mess in the bathroom this morning and his cousin got angry with him. Pt sts him and his cousin started verbally arguing. Pt reports having anxiety and feeling really shaken after the argument. Pt sts his neighbor heard his arguing and brought him to the ED. Pt reports thoughts of harming himself which started during the argument. Pt sts he wanted to run into traffic. Pt continues to have these thoughts presently. Denies feelings of harming others but sts he does not trust himself around his cousin.

## 2015-01-14 NOTE — ED Notes (Addendum)
Pt brought into ED BHU via sally port and wand with metal detector for safety by ODS officer. Patient oriented to unit/care area: Pt informed of unit policies and procedures.  Informed that, for their safety, care areas are designed for safety and monitored by security cameras at all times; and visiting hours explained to patient. Patient verbalizes understanding, and verbal contract for safety obtained.Pt shown to their room.  BEHAVIORAL HEALTH ROUNDING  Patient sleeping: No.  Patient alert and oriented: yes  Behavior appropriate: Yes. ; If no, describe:  Nutrition and fluids offered: Yes  Toileting and hygiene offered: Yes  Sitter present: not applicable  Law enforcement present: Yes ODS  ENVIRONMENTAL ASSESSMENT  Potentially harmful objects out of patient reach: Yes.  Personal belongings secured: Yes.  Patient dressed in hospital provided attire only: Yes.  Plastic bags out of patient reach: Yes.  Patient care equipment (cords, cables, call bells, lines, and drains) shortened, removed, or accounted for: Yes.  Equipment and supplies removed from bottom of stretcher: Yes.  Potentially toxic materials out of patient reach: Yes.  Sharps container removed or out of patient reach: Yes.

## 2015-01-14 NOTE — ED Provider Notes (Signed)
[facility] Emergency Department Provider Note REMINDER - THIS NOTE IS NOT A FINAL MEDICAL RECORD UNTIL IT IS SIGNED. UNTIL THEN, THE CONTENT BELOW MAY REFLECT INFORMATION FROM A DOCUMENTATION TEMPLATE, NOT THE ACTUAL PATIENT VISIT. ____________________________________________  Time seen: Approximately 2:25 PM  I have reviewed the triage vital signs and the nursing notes.   HISTORY  Chief Complaint Suicidal    HPI [name] is a 49 y.o. male history of bipolar disorder, substance abuse, anxiety chronic pain, appendectomy and hernia repair.  Patient presents today states that at home he had a loose bowel movement, and some of it got on the floor and his cousin whom he lives with kicked him out. He has reports that he became suicidal and presents today stating that he has nowhere else to go, that he wants to kill himself though he has no clear plan aside from perhaps walking into traffic.  He denies fevers or chills, he has some crampy pain when using the bathroom today this is resolved and is currently wishing to eat and is hungry. No fevers or chills. He denies any chest pain or trouble breathing.  Denies any bloody bowel movements, states it occasionally he has loose bowel movements, no different for the last several months.  Past Medical History  Diagnosis Date  . Polysubstance abuse   . Bipolar disorder (HCC)   . Esophageal stricture   . H/O tonic-clonic seizures   . Anxiety   . GERD (gastroesophageal reflux disease)   . Chronic pain     Patient Active Problem List   Diagnosis Date Noted  . Dyslipidemia 01/03/2015  . Tobacco use disorder 01/03/2015  . Major depressive disorder, recurrent severe without psychotic features (HCC) 01/02/2015  . Hypertension 01/02/2015  . Gastric reflux 01/02/2015    Past Surgical History  Procedure Laterality Date  . Appendectomy      complicated by perforation and abscess  . Peg w/tracheostomy placement     . Peg tube removal    . Tracheostomy closure    . Esophagogastroduodenoscopy (egd) with esophageal dilation    . Hernia repair  Nov 2016    [facility] by Dr. Genene ChurnBunzendahl    Current Outpatient Rx  Name  Route  Sig  Dispense  Refill  . atorvastatin (LIPITOR) 10 MG tablet   Oral   Take 10 mg by mouth every evening.         . busPIRone (BUSPAR) 15 MG tablet   Oral   Take 1 tablet (15 mg total) by mouth 3 (three) times daily.   90 tablet   0   . clonazePAM (KLONOPIN) 0.5 MG tablet   Oral   Take 1 tablet (0.5 mg total) by mouth 3 (three) times daily as needed. Patient taking differently: Take 0.5 mg by mouth 3 (three) times daily as needed for anxiety.    30 tablet   0   . diltiazem (CARDIZEM) 30 MG tablet   Oral   Take 1 tablet (30 mg total) by mouth 4 (four) times daily.   120 tablet   0   . ferrous sulfate 325 (65 FE) MG tablet   Oral   Take 325 mg by mouth daily.         . fluticasone (FLONASE) 50 MCG/ACT nasal spray   Each Nare   Place 2 sprays into both nostrils daily.         Marland Kitchen. gabapentin (NEURONTIN) 300 MG capsule   Oral  Take 1 capsule (300 mg total) by mouth 2 (two) times daily.   60 capsule   0   . loratadine (CLARITIN) 10 MG tablet   Oral   Take 10 mg by mouth daily.         . metoprolol succinate (TOPROL-XL) 50 MG 24 hr tablet   Oral   Take 50 mg by mouth daily.         . pantoprazole (PROTONIX) 40 MG tablet   Oral   Take 1 tablet (40 mg total) by mouth daily.   30 tablet   0   . venlafaxine XR (EFFEXOR-XR) 150 MG 24 hr capsule   Oral   Take 1 capsule (150 mg total) by mouth daily with breakfast.   30 capsule   0     Allergies Review of patient's allergies indicates no known allergies.  No family history on file.  Social History Social History  Substance Use Topics  . Smoking status: Current Every Day Smoker -- 1.00 packs/day    Types: Cigarettes  . Smokeless tobacco: None  . Alcohol Use: No     Review of Systems Constitutional: No fever/chills Eyes: No visual changes. ENT: No sore throat. Cardiovascular: Denies chest pain. Respiratory: Denies shortness of breath. Gastrointestinal:Abdominal pain is resolved. No nausea or vomiting. No constipation. Genitourinary: Negative for dysuria. Musculoskeletal: Negative for back pain. Skin: Negative for rash. Neurological: Negative for headaches, focal weakness or numbness.  10-point ROS otherwise negative.  ____________________________________________   PHYSICAL EXAM:  VITAL SIGNS: ED Triage Vitals  Enc Vitals Group     BP 01/14/15 1325 143/78 mmHg     Pulse Rate 01/14/15 1325 78     Resp 01/14/15 1325 16     Temp 01/14/15 1325 98.6 F (37 C)     Temp Source 01/14/15 1325 Oral     SpO2 01/14/15 1325 95 %     Weight 01/14/15 1325 186 lb (84.369 kg)     Height 01/14/15 1325  (1.702 m)     Head Cir --      Peak Flow --      Pain Score 01/14/15 1337 0     Pain Loc --      Pain Edu? --      Excl. in GC? --    Constitutional: Alert and oriented. Well appearing and in no acute distress. Eyes: Conjunctivae are normal. PERRL. EOMI. Head: Atraumatic. Nose: No congestion/rhinnorhea. Mouth/Throat: Mucous membranes are moist.  Oropharynx non-erythematous. Neck: No stridor.   Cardiovascular: Normal rate, regular rhythm. Grossly normal heart sounds.  Good peripheral circulation. Respiratory: Normal respiratory effort.  No retractions. Lungs CTAB. Gastrointestinal: Soft and nontender except for some minimal discomfort to palpation on the right lower quadrant. No distention. No abdominal bruits. No CVA tenderness.No rebound or guarding. Musculoskeletal: No lower extremity tenderness nor edema.  No joint effusions. Neurologic:  Normal speech and language. No gross focal neurologic deficits are appreciated. No gait instability. Skin:  Skin is warm, dry and intact. No rash noted. Psychiatric: Mood and affect are normal. Speech  and behavior are normal.  ____________________________________________   LABS (all labs ordered are listed, but only abnormal results are displayed)  Labs Reviewed  COMPREHENSIVE METABOLIC PANEL - Abnormal; Notable for the following:    Glucose, Bld 133 (*)    Total Protein 8.3 (*)    All other components within normal limits  ACETAMINOPHEN LEVEL - Abnormal; Notable for the following:    Acetaminophen (Tylenol), Serum <10 (*)  All other components within normal limits  CBC - Abnormal; Notable for the following:    RDW 15.3 (*)    All other components within normal limits  ETHANOL  SALICYLATE LEVEL  URINE DRUG SCREEN, QUALITATIVE ([facility] ONLY)   ____________________________________________  EKG   ____________________________________________  RADIOLOGY  DG Abd 2 Views (Final result) Result time: 01/14/15 14:44:04   Final result by Rad Results In Interface (01/14/15 14:44:04)   Narrative:   CLINICAL DATA: Abdominal discomfort, psychiatric evaluation  EXAM: ABDOMEN - 2 VIEW  COMPARISON: 01/01/2015  FINDINGS: Nonobstructive bowel gas pattern.  No evidence of free air under the diaphragm on the upright view.  Degenerative changes of the lumbar spine.  IMPRESSION: No evidence of small bowel obstruction or free air.    ____________________________________________   PROCEDURES  Procedure(s) performed: None  Critical Care performed: No  ____________________________________________   INITIAL IMPRESSION / ASSESSMENT AND PLAN / ED COURSE  Pertinent labs & imaging results that were available during my care of the patient were reviewed by me and considered in my medical decision making (see chart for details).  Patient presents primarily for concerns of feeling suicidal after being kicked out of his cousin's home today, has no rales to go. He also reports he had an episode of crampy abdominal pain followed by diarrhea this morning which has resolved and he  is now hungry and wishes to eat. Reassuring examination.   Patient denies overdose or ingestion. He does have a long-standing history of psychiatric disease including bipolar disorder. Because of his complaint developed place him on involuntary commitment, and ordered psychiatric consultation.  ____________________________________________   FINAL CLINICAL IMPRESSION(S) / ED DIAGNOSES  Final diagnoses:  Severe episode of recurrent major depressive disorder, with psychotic features (HCC)      Sharyn Creamer, MD 01/14/15 1556

## 2015-01-14 NOTE — ED Notes (Signed)
ED BHU PLACEMENT JUSTIFICATION  Is the patient under IVC or is there intent for IVC: Yes.  Is the patient medically cleared: Yes.  Is there vacancy in the ED BHU: Yes.  Is the population mix appropriate for patient: Yes.  Is the patient awaiting placement in inpatient or outpatient setting: Yes.  Has the patient had a psychiatric consult: Yes.  Survey of unit performed for contraband, proper placement and condition of furniture, tampering with fixtures in bathroom, shower, and each patient room: Yes. ; Findings: All clear  APPEARANCE/BEHAVIOR  calm, cooperative and adequate rapport can be established  NEURO ASSESSMENT  Orientation: time, place and person  Hallucinations: No.None noted (Hallucinations)  Speech: Normal  Gait: normal  RESPIRATORY ASSESSMENT  WNL  CARDIOVASCULAR ASSESSMENT  WNL  GASTROINTESTINAL ASSESSMENT  WNL  EXTREMITIES  WNL  PLAN OF CARE  Provide calm/safe environment. Vital signs assessed twice daily. ED BHU Assessment once each 12-hour shift. Collaborate with intake RN daily or as condition indicates. Assure the ED provider has rounded once each shift. Provide and encourage hygiene. Provide redirection as needed. Assess for escalating behavior; address immediately and inform ED provider.  Assess family dynamic and appropriateness for visitation as needed: Yes. ; If necessary, describe findings:  Educate the patient/family about BHU procedures/visitation: Yes. ; If necessary, describe findings: Pt is calm and cooperative at this time. Pt understanding and accepting of unit procedures/rules. Will continue to monitor.  BEHAVIORAL HEALTH ROUNDING  Patient sleeping: No.  Patient alert and oriented: yes  Behavior appropriate: Yes. ; If no, describe:  Nutrition and fluids offered: Yes  Toileting and hygiene offered: Yes  Sitter present: not applicable  Law enforcement present: Yes ODS

## 2015-01-14 NOTE — ED Notes (Signed)
BEHAVIORAL HEALTH ROUNDING Patient sleeping: No. Patient alert and oriented: yes Behavior appropriate: Yes.  ; If no, describe:  Nutrition and fluids offered: Yes Toileting and hygiene offered: Yes  Sitter present: yes Law enforcement present: Yes ODS  

## 2015-01-14 NOTE — ED Notes (Signed)
Presents states he is suicidal  ..

## 2015-01-14 NOTE — Consult Note (Signed)
[facility] Consult   Reason for Consult:  Follow up Referring Physician:  ER Patient Identification: [name] MRN:  [id number] Principal Diagnosis: <principal problem not specified> Diagnosis:   Patient Active Problem List   Diagnosis Date Noted  . Dyslipidemia [E78.5] 01/03/2015  . Tobacco use disorder [F17.200] 01/03/2015  . Major depressive disorder, recurrent severe without psychotic features ([place]) [F33.2] 01/02/2015  . Hypertension [I10] 01/02/2015  . Gastric reflux [K21.9] 01/02/2015    Total Time spent with patient: 45 minutes  Subjective:   [name] is a 49 y.o. male patient admitted with a long H.O mental illness .and last night he decompensated and became suicidal and lives with cousin.  HPI:  Pt reports that he had a bowel movement and then for no reason he decompensated and became depressed adn suicidal and started seeing demons and hearing demons.  Past Psychiatric History: Had 3 Inpt to psychiatry including to [facility] Being followed at [facility] by Dr. Jeanell Sparrow. Last apt was in Oct 2016 and next apt is in few days.  Risk to Self: Is patient at risk for suicide?: Yes Risk to Others:   Prior Inpatient Therapy:   Prior Outpatient Therapy:    Past Medical History:  Past Medical History  Diagnosis Date  . Polysubstance abuse   . Bipolar disorder ([place])   . Esophageal stricture   . H/O tonic-clonic seizures   . Anxiety   . GERD (gastroesophageal reflux disease)   . Chronic pain     Past Surgical History  Procedure Laterality Date  . Appendectomy      complicated by perforation and abscess  . Peg w/tracheostomy placement    . Peg tube removal    . Tracheostomy closure    . Esophagogastroduodenoscopy (egd) with esophageal dilation    . Hernia repair  Nov 2016    ventral hernia repair at [facility] by Dr. Tod Persia   Family History: No family history on file. Family Psychiatric  History: none Social History:  History  Alcohol Use No      History  Drug Use  . Yes  . Special: Marijuana    Social History   Social History  . Marital Status: Single    Spouse Name: N/A  . Number of Children: N/A  . Years of Education: N/A   Social History Main Topics  . Smoking status: Current Every Day Smoker -- 1.00 packs/day    Types: Cigarettes  . Smokeless tobacco: None  . Alcohol Use: No  . Drug Use: Yes    Special: Marijuana  . Sexual Activity: Not Asked   Other Topics Concern  . None   Social History Narrative   Additional Social History:                          Allergies:  No Known Allergies  Labs:  Results for orders placed or performed during the hospital encounter of 01/14/15 (from the past 48 hour(s))  Comprehensive metabolic panel     Status: Abnormal   Collection Time: 01/14/15  1:46 PM  Result Value Ref Range   Sodium 140 135 - 145 mmol/L   Potassium 3.7 3.5 - 5.1 mmol/L   Chloride 101 101 - 111 mmol/L   CO2 31 22 - 32 mmol/L   Glucose, Bld 133 (H) 65 - 99 mg/dL   BUN 11 6 - 20 mg/dL   Creatinine, Ser 1.16 0.61 - 1.24 mg/dL   Calcium  9.3 8.9 - 10.3 mg/dL   Total Protein 8.3 (H) 6.5 - 8.1 g/dL   Albumin 4.0 3.5 - 5.0 g/dL   AST 20 15 - 41 U/L   ALT 17 17 - 63 U/L   Alkaline Phosphatase 79 38 - 126 U/L   Total Bilirubin 0.5 0.3 - 1.2 mg/dL   GFR calc non Af Amer >60 >60 mL/min   GFR calc Af Amer >60 >60 mL/min    Comment: (NOTE) The eGFR has been calculated using the CKD EPI equation. This calculation has not been validated in all clinical situations. eGFR's persistently <60 mL/min signify possible Chronic Kidney Disease.    Anion gap 8 5 - 15  Ethanol (ETOH)     Status: None   Collection Time: 01/14/15  1:46 PM  Result Value Ref Range   Alcohol, Ethyl (B) <5 <5 mg/dL    Comment:        LOWEST DETECTABLE LIMIT FOR SERUM ALCOHOL IS 5 mg/dL FOR MEDICAL PURPOSES ONLY   Salicylate level     Status: None   Collection Time: 01/14/15  1:46 PM  Result Value Ref Range   Salicylate  Lvl <5.5 2.8 - 30.0 mg/dL  Acetaminophen level     Status: Abnormal   Collection Time: 01/14/15  1:46 PM  Result Value Ref Range   Acetaminophen (Tylenol), Serum <10 (L) 10 - 30 ug/mL    Comment:        THERAPEUTIC CONCENTRATIONS VARY SIGNIFICANTLY. A RANGE OF 10-30 ug/mL MAY BE AN EFFECTIVE CONCENTRATION FOR MANY PATIENTS. HOWEVER, SOME ARE BEST TREATED AT CONCENTRATIONS OUTSIDE THIS RANGE. ACETAMINOPHEN CONCENTRATIONS >150 ug/mL AT 4 HOURS AFTER INGESTION AND >50 ug/mL AT 12 HOURS AFTER INGESTION ARE OFTEN ASSOCIATED WITH TOXIC REACTIONS.   CBC     Status: Abnormal   Collection Time: 01/14/15  1:46 PM  Result Value Ref Range   WBC 7.3 3.8 - 10.6 K/uL   RBC 5.08 4.40 - 5.90 MIL/uL   Hemoglobin 15.2 13.0 - 18.0 g/dL   HCT 46.7 40.0 - 52.0 %   MCV 91.8 80.0 - 100.0 fL   MCH 29.9 26.0 - 34.0 pg   MCHC 32.5 32.0 - 36.0 g/dL   RDW 15.3 (H) 11.5 - 14.5 %   Platelets 180 150 - 440 K/uL  Urine Drug Screen, Qualitative ([facility] only)     Status: None   Collection Time: 01/14/15  1:46 PM  Result Value Ref Range   Tricyclic, Ur Screen NONE DETECTED NONE DETECTED   Amphetamines, Ur Screen NONE DETECTED NONE DETECTED   MDMA (Ecstasy)Ur Screen NONE DETECTED NONE DETECTED   Cocaine Metabolite,Ur [place] NONE DETECTED NONE DETECTED   Opiate, Ur Screen NONE DETECTED NONE DETECTED   Phencyclidine (PCP) Ur S NONE DETECTED NONE DETECTED   Cannabinoid 50 Ng, Ur [place] NONE DETECTED NONE DETECTED   Barbiturates, Ur Screen NONE DETECTED NONE DETECTED   Benzodiazepine, Ur Scrn NONE DETECTED NONE DETECTED   Methadone Scn, Ur NONE DETECTED NONE DETECTED    Comment: (NOTE) 974  Tricyclics, urine               Cutoff 1000 ng/mL 200  Amphetamines, urine             Cutoff 1000 ng/mL 300  MDMA (Ecstasy), urine           Cutoff 500 ng/mL 400  Cocaine Metabolite, urine       Cutoff 300 ng/mL 500  Opiate, urine  Cutoff 300 ng/mL 600  Phencyclidine (PCP), urine      Cutoff 25 ng/mL 700   Cannabinoid, urine              Cutoff 50 ng/mL 800  Barbiturates, urine             Cutoff 200 ng/mL 900  Benzodiazepine, urine           Cutoff 200 ng/mL 1000 Methadone, urine                Cutoff 300 ng/mL 1100 1200 The urine drug screen provides only a preliminary, unconfirmed 1300 analytical test result and should not be used for non-medical 1400 purposes. Clinical consideration and professional judgment should 1500 be applied to any positive drug screen result due to possible 1600 interfering substances. A more specific alternate chemical method 1700 must be used in order to obtain a confirmed analytical result.  1800 Gas chromato graphy / mass spectrometry (GC/MS) is the preferred 1900 confirmatory method.     No current facility-administered medications for this encounter.   Current Outpatient Prescriptions  Medication Sig Dispense Refill  . atorvastatin (LIPITOR) 10 MG tablet Take 10 mg by mouth every evening.    . busPIRone (BUSPAR) 15 MG tablet Take 1 tablet (15 mg total) by mouth 3 (three) times daily. 90 tablet 0  . clonazePAM (KLONOPIN) 0.5 MG tablet Take 1 tablet (0.5 mg total) by mouth 3 (three) times daily as needed. (Patient taking differently: Take 0.5 mg by mouth 3 (three) times daily as needed for anxiety. ) 30 tablet 0  . diltiazem (CARDIZEM) 30 MG tablet Take 1 tablet (30 mg total) by mouth 4 (four) times daily. 120 tablet 0  . ferrous sulfate 325 (65 FE) MG tablet Take 325 mg by mouth daily.    . fluticasone (FLONASE) 50 MCG/ACT nasal spray Place 2 sprays into both nostrils daily.    Marland Kitchen gabapentin (NEURONTIN) 300 MG capsule Take 1 capsule (300 mg total) by mouth 2 (two) times daily. 60 capsule 0  . loratadine (CLARITIN) 10 MG tablet Take 10 mg by mouth daily.    . metoprolol succinate (TOPROL-XL) 50 MG 24 hr tablet Take 50 mg by mouth daily.    . pantoprazole (PROTONIX) 40 MG tablet Take 1 tablet (40 mg total) by mouth daily. 30 tablet 0  . venlafaxine XR  (EFFEXOR-XR) 150 MG 24 hr capsule Take 1 capsule (150 mg total) by mouth daily with breakfast. 30 capsule 0  . [DISCONTINUED] potassium chloride (KLOR-CON M10) 10 MEQ tablet Take 1 tablet by mouth daily.      Musculoskeletal: Strength & Muscle Tone: within normal limits Gait & Station: normal Patient leans: N/A  Psychiatric Specialty Exam: Review of Systems  All other systems reviewed and are negative.   Blood pressure 143/78, pulse 78, temperature 98.6 F (37 C), temperature source Oral, resp. rate 16, height 5' 7" (1.702 m), weight 186 lb (84.369 kg), SpO2 95 %.Body mass index is 29.12 kg/(m^2).  General Appearance: Casual  Eye Contact::  Fair  Speech:  Normal Rate  Volume:  Normal  Mood:  Anxious  Affect:  Depressed  Thought Process:  Circumstantial  Orientation:  Full (Time, Place, and Person)  Thought Content:  Hallucinations: Auditory Visual  Suicidal Thoughts:  Yes.  without intent/plan  Homicidal Thoughts:  No  Memory:  Immediate;   Fair Recent;   Fair Remote;   Fair adequate.  Judgement:  Fair  Insight:  Fair  Psychomotor Activity:  Normal  Concentration:  Fair  Recall:  [employer] of Knowledge:Poor  Language: Fair  Akathisia:  No  Handed:  Right  AIMS (if indicated):     Assets:  Communication Skills Desire for Improvement Housing Social Support Vocational/Educational  ADL's:  Intact  Cognition: WNL  Sleep:      Treatment Plan Summary: Plan ADmit to Inpt Bh after pt is medically cleared and stable and bed is available  Disposition: Recommend psychiatric Inpatient admission when medically cleared.  Camie Patience K 01/14/2015 5:06 PM

## 2015-01-15 DIAGNOSIS — F4321 Adjustment disorder with depressed mood: Secondary | ICD-10-CM

## 2015-01-15 NOTE — ED Notes (Signed)
BEHAVIORAL HEALTH ROUNDING Patient sleeping: Yes.   Patient alert and oriented: not applicable SLEEPING Behavior appropriate: Yes.  ; If no, describe: SLEEPING Nutrition and fluids offered: No SLEEPING Toileting and hygiene offered: NoSLEEPING Sitter present: not applicable Law enforcement present: Yes [facility] 

## 2015-01-15 NOTE — ED Provider Notes (Signed)
-----------------------------------------   7:11 AM on 01/15/2015 -----------------------------------------   Blood pressure 120/85, pulse 73, temperature 98 F (36.7 C), temperature source Oral, resp. rate 18, height 5\' 7"  (1.702 m), weight 186 lb (84.369 kg), SpO2 95 %.  The patient had no acute events since last update.  Calm and cooperative at this time.  Disposition is pending per Psychiatry/Behavioral Medicine team recommendations.     Irean HongJade J Sung, MD 01/15/15 [phone]

## 2015-01-15 NOTE — ED Notes (Signed)
Patient resting quietly in room. No noted distress or abnormal behaviors noted. Will continue 15 minute checks and observation by security camera for safety. 

## 2015-01-15 NOTE — ED Notes (Signed)

## 2015-01-15 NOTE — ED Provider Notes (Signed)
-----------------------------------------   3:28 PM on 01/15/2015 -----------------------------------------  Patient has been seen by Dr. Toni Amendlapacs. He has rescinded the involuntary commitment and the patient was discharged from the emergency department. He will follow-up with [facility]. The patient is reported that he will see the rescue mission tonight.  Darien Ramusavid W Saavi Mceachron, MD 01/15/15 [phone]

## 2015-01-15 NOTE — Discharge Instructions (Signed)
Follow-up with [facility]. Return to the emergency department if you have any urgent concerns.

## 2015-01-15 NOTE — Progress Notes (Signed)
TTS has made pt aware that the psychiatric unit is at capacity and that the plan is to refer to other facilities in attempts to obtain placement.  01/15/2015 Nicole Bedie Dominey, MS, NCC, LPCA Therapeutic Triage Specialist  

## 2015-01-15 NOTE — ED Notes (Signed)
Patient discharged ambulatory to self. He denies SI or HI. Discharge instructions reviewed with patient, he verbalizes understanding. Patient received copy of DC plans, and all personal belongings.

## 2015-01-15 NOTE — ED Notes (Signed)
Patient met with Dr. Weber Cooks. Decision made to discharge patient and continue with outpatient care. Patient aware.  Maintained on 15 minute checks and observation by security camera for safety.

## 2015-01-15 NOTE — Consult Note (Signed)
[facility] Face-to-Face Psychiatry Consult   Reason for Consult:  Consult for this 49 year old man with a history of recurrent depression who came in to the emergency room initially reporting some suicidal thoughts. Referring Physician:  Thomasene Lot Patient Identification: [name] MRN:  [id number] Principal Diagnosis: Adjustment disorder with depressed mood Diagnosis:   Patient Active Problem List   Diagnosis Date Noted  . Adjustment disorder with depressed mood [F43.21] 01/15/2015  . Dyslipidemia [E78.5] 01/03/2015  . Tobacco use disorder [F17.200] 01/03/2015  . Major depressive disorder, recurrent severe without psychotic features ([place]) [F33.2] 01/02/2015  . Hypertension [I10] 01/02/2015  . Gastric reflux [K21.9] 01/02/2015    Total Time spent with patient: 1 hour  Subjective:   [name] is a 49 y.o. male patient admitted with "I'm feeling much better".  HPI:  This is a follow-up note for this gentleman who was seen over the weekend. 49 year old man with a history of mental health problems. He has been living with his cousin recently and it's been a unpleasant situation for the patient. He says that this Sunday he took his medication as usual and then had a sudden explosive bowel movement made a mess in the bathroom. Even though he says that he was cleaning it up he claims that his cousin was being mean to him and saying derogatory things which made the patient feel bad and upset. Had transient thoughts about killing himself. Came to the emergency room for evaluation. Prior to this incident he says his mood is overall not been too bad. We has been about the same as usual. He says that he has been taking his psychiatric medicine as prescribed and has not been drinking or abusing drugs. He says that his goal at this point is to go stay at the [facility]. Since being in the emergency room and resting of he's feeling much better.  Social history: Patient does have some other family in  the area such as his mother but doesn't really get along with her. The cousin was the closest relative he could stay with. He thinks that at this point however he will be better off staying at the [facility] for the time being and hopes that perhaps then he can get a better living situation in the future. No children himself. Disabled.  Medical history: Has high blood pressure a history of gastric reflux symptoms and history of dyslipidemia. Patient is not complaining of any acute medical issues other than the diarrhea which seems to of been a transient thing.  Substance abuse history: Patient says there was a time in the past when he drank too much but recently has not been drinking and doesn't abuse any other drugs.  Past Psychiatric History: Past history of depression and anxiety. Just speaking with him he seems like he may have some chronic cognitive impairment of some degree and is clearly chronically a dysfunctional. Doesn't seem to of had a history of a diagnosis with psychotic symptoms. Has cut himself in the past but it's been a while. No other suicide attempts. Denies history of violence.  Risk to Self: Suicidal Ideation: Yes-Currently Present Suicidal Intent: No Is patient at risk for suicide?: Yes Suicidal Plan?: No (UTA) Access to Means:  (N/A) Specify Access to Suicidal Means: N/A How many times?: 3 Other Self Harm Risks: None Reported  Triggers for Past Attempts: Unknown Intentional Self Injurious Behavior: Cutting Comment - Self Injurious Behavior: Pt has  a history of cutting wrist  Risk to Others:  Homicidal Ideation: No Thoughts of Harm to Others: No Current Homicidal Intent: No Current Homicidal Plan: No Access to Homicidal Means: No Identified Victim: N/A History of harm to others?: No Assessment of Violence: None Noted Violent Behavior Description: None reported  Does patient have access to weapons?: No Criminal Charges Pending?: No Does patient have a court  date: No Prior Inpatient Therapy: Prior Inpatient Therapy: Yes Prior Therapy Dates: 2015 Prior Therapy Facilty/Provider(s): [facility] Reason for Treatment: SI Prior Outpatient Therapy: Prior Outpatient Therapy: Yes Prior Therapy Dates: Sept. 2016 Prior Therapy Facilty/Provider(s): [facility]  Reason for Treatment: SI  Does patient have an ACCT team?: No Does patient have Intensive In-House Services?  : No Does patient have [facility] services? : No Does patient have P4CC services?: No  Past Medical History:  Past Medical History  Diagnosis Date  . Polysubstance abuse   . Bipolar disorder ([place])   . Esophageal stricture   . H/O tonic-clonic seizures   . Anxiety   . GERD (gastroesophageal reflux disease)   . Chronic pain     Past Surgical History  Procedure Laterality Date  . Appendectomy      complicated by perforation and abscess  . Peg w/tracheostomy placement    . Peg tube removal    . Tracheostomy closure    . Esophagogastroduodenoscopy (egd) with esophageal dilation    . Hernia repair  Nov 2016    ventral hernia repair at [facility] by Dr. Tod Persia   Family History: No family history on file. Family Psychiatric  History: Family history is positive for what he calls "issues" in his mother and sister which sound like mood problems. He says he has 2 stepbrothers who drink too much. Social History:  History  Alcohol Use No     History  Drug Use  . Yes  . Special: Marijuana    Social History   Social History  . Marital Status: Single    Spouse Name: N/A  . Number of Children: N/A  . Years of Education: N/A   Social History Main Topics  . Smoking status: Current Every Day Smoker -- 1.00 packs/day    Types: Cigarettes  . Smokeless tobacco: None  . Alcohol Use: No  . Drug Use: Yes    Special: Marijuana  . Sexual Activity: Not Asked   Other Topics Concern  . None   Social History Narrative   Additional Social History:    Pain Medications:  Denies Prescriptions: Denies Over the Counter: Denies History of alcohol / drug use?: No history of alcohol / drug abuse Negative Consequences of Use:  (N/A) Withdrawal Symptoms:  (N/A)                     Allergies:  No Known Allergies  Labs:  Results for orders placed or performed during the hospital encounter of 01/14/15 (from the past 48 hour(s))  Comprehensive metabolic panel     Status: Abnormal   Collection Time: 01/14/15  1:46 PM  Result Value Ref Range   Sodium 140 135 - 145 mmol/L   Potassium 3.7 3.5 - 5.1 mmol/L   Chloride 101 101 - 111 mmol/L   CO2 31 22 - 32 mmol/L   Glucose, Bld 133 (H) 65 - 99 mg/dL   BUN 11 6 - 20 mg/dL   Creatinine, Ser 1.16 0.61 - 1.24 mg/dL   Calcium 9.3 8.9 - 10.3 mg/dL   Total Protein 8.3 (H) 6.5 - 8.1 g/dL   Albumin 4.0  3.5 - 5.0 g/dL   AST 20 15 - 41 U/L   ALT 17 17 - 63 U/L   Alkaline Phosphatase 79 38 - 126 U/L   Total Bilirubin 0.5 0.3 - 1.2 mg/dL   GFR calc non Af Amer >60 >60 mL/min   GFR calc Af Amer >60 >60 mL/min    Comment: (NOTE) The eGFR has been calculated using the CKD EPI equation. This calculation has not been validated in all clinical situations. eGFR's persistently <60 mL/min signify possible Chronic Kidney Disease.    Anion gap 8 5 - 15  Ethanol (ETOH)     Status: None   Collection Time: 01/14/15  1:46 PM  Result Value Ref Range   Alcohol, Ethyl (B) <5 <5 mg/dL    Comment:        LOWEST DETECTABLE LIMIT FOR SERUM ALCOHOL IS 5 mg/dL FOR MEDICAL PURPOSES ONLY   Salicylate level     Status: None   Collection Time: 01/14/15  1:46 PM  Result Value Ref Range   Salicylate Lvl <0.1 2.8 - 30.0 mg/dL  Acetaminophen level     Status: Abnormal   Collection Time: 01/14/15  1:46 PM  Result Value Ref Range   Acetaminophen (Tylenol), Serum <10 (L) 10 - 30 ug/mL    Comment:        THERAPEUTIC CONCENTRATIONS VARY SIGNIFICANTLY. A RANGE OF 10-30 ug/mL MAY BE AN EFFECTIVE CONCENTRATION FOR MANY  PATIENTS. HOWEVER, SOME ARE BEST TREATED AT CONCENTRATIONS OUTSIDE THIS RANGE. ACETAMINOPHEN CONCENTRATIONS >150 ug/mL AT 4 HOURS AFTER INGESTION AND >50 ug/mL AT 12 HOURS AFTER INGESTION ARE OFTEN ASSOCIATED WITH TOXIC REACTIONS.   CBC     Status: Abnormal   Collection Time: 01/14/15  1:46 PM  Result Value Ref Range   WBC 7.3 3.8 - 10.6 K/uL   RBC 5.08 4.40 - 5.90 MIL/uL   Hemoglobin 15.2 13.0 - 18.0 g/dL   HCT 46.7 40.0 - 52.0 %   MCV 91.8 80.0 - 100.0 fL   MCH 29.9 26.0 - 34.0 pg   MCHC 32.5 32.0 - 36.0 g/dL   RDW 15.3 (H) 11.5 - 14.5 %   Platelets 180 150 - 440 K/uL  Urine Drug Screen, Qualitative ([facility] only)     Status: None   Collection Time: 01/14/15  1:46 PM  Result Value Ref Range   Tricyclic, Ur Screen NONE DETECTED NONE DETECTED   Amphetamines, Ur Screen NONE DETECTED NONE DETECTED   MDMA (Ecstasy)Ur Screen NONE DETECTED NONE DETECTED   Cocaine Metabolite,Ur [place] NONE DETECTED NONE DETECTED   Opiate, Ur Screen NONE DETECTED NONE DETECTED   Phencyclidine (PCP) Ur S NONE DETECTED NONE DETECTED   Cannabinoid 50 Ng, Ur [place] NONE DETECTED NONE DETECTED   Barbiturates, Ur Screen NONE DETECTED NONE DETECTED   Benzodiazepine, Ur Scrn NONE DETECTED NONE DETECTED   Methadone Scn, Ur NONE DETECTED NONE DETECTED    Comment: (NOTE) 093  Tricyclics, urine               Cutoff 1000 ng/mL 200  Amphetamines, urine             Cutoff 1000 ng/mL 300  MDMA (Ecstasy), urine           Cutoff 500 ng/mL 400  Cocaine Metabolite, urine       Cutoff 300 ng/mL 500  Opiate, urine                   Cutoff 300 ng/mL 600  Phencyclidine (  PCP), urine      Cutoff 25 ng/mL 700  Cannabinoid, urine              Cutoff 50 ng/mL 800  Barbiturates, urine             Cutoff 200 ng/mL 900  Benzodiazepine, urine           Cutoff 200 ng/mL 1000 Methadone, urine                Cutoff 300 ng/mL 1100 1200 The urine drug screen provides only a preliminary, unconfirmed 1300 analytical test result and should  not be used for non-medical 1400 purposes. Clinical consideration and professional judgment should 1500 be applied to any positive drug screen result due to possible 1600 interfering substances. A more specific alternate chemical method 1700 must be used in order to obtain a confirmed analytical result.  1800 Gas chromato graphy / mass spectrometry (GC/MS) is the preferred 1900 confirmatory method.     Current Facility-Administered Medications  Medication Dose Route Frequency Provider Last Rate Last Dose  . citalopram (CELEXA) tablet 20 mg  20 mg Oral Daily Dewain Penning, MD   20 mg at 01/15/15 0913  . risperiDONE (RISPERDAL M-TABS) disintegrating tablet 1 mg  1 mg Oral BID Dewain Penning, MD   1 mg at 01/15/15 0915  . traZODone (DESYREL) tablet 50 mg  50 mg Oral QHS Dewain Penning, MD   50 mg at 01/14/15 2110   Current Outpatient Prescriptions  Medication Sig Dispense Refill  . atorvastatin (LIPITOR) 10 MG tablet Take 10 mg by mouth every evening.    . busPIRone (BUSPAR) 15 MG tablet Take 1 tablet (15 mg total) by mouth 3 (three) times daily. 90 tablet 0  . clonazePAM (KLONOPIN) 0.5 MG tablet Take 1 tablet (0.5 mg total) by mouth 3 (three) times daily as needed. (Patient taking differently: Take 0.5 mg by mouth 3 (three) times daily as needed for anxiety. ) 30 tablet 0  . diltiazem (CARDIZEM) 30 MG tablet Take 1 tablet (30 mg total) by mouth 4 (four) times daily. 120 tablet 0  . ferrous sulfate 325 (65 FE) MG tablet Take 325 mg by mouth daily.    . fluticasone (FLONASE) 50 MCG/ACT nasal spray Place 2 sprays into both nostrils daily.    Marland Kitchen gabapentin (NEURONTIN) 300 MG capsule Take 1 capsule (300 mg total) by mouth 2 (two) times daily. 60 capsule 0  . loratadine (CLARITIN) 10 MG tablet Take 10 mg by mouth daily.    . metoprolol succinate (TOPROL-XL) 50 MG 24 hr tablet Take 50 mg by mouth daily.    . pantoprazole (PROTONIX) 40 MG tablet Take 1 tablet (40 mg total) by mouth daily. 30 tablet  0  . venlafaxine XR (EFFEXOR-XR) 150 MG 24 hr capsule Take 1 capsule (150 mg total) by mouth daily with breakfast. 30 capsule 0  . [DISCONTINUED] potassium chloride (KLOR-CON M10) 10 MEQ tablet Take 1 tablet by mouth daily.      Musculoskeletal: Strength & Muscle Tone: within normal limits Gait & Station: normal Patient leans: N/A  Psychiatric Specialty Exam: Review of Systems  Constitutional: Negative.   HENT: Negative.   Eyes: Negative.   Respiratory: Negative.   Cardiovascular: Negative.   Gastrointestinal: Negative.   Musculoskeletal: Negative.   Skin: Negative.   Neurological: Negative.   Psychiatric/Behavioral: Negative for depression, suicidal ideas, hallucinations, memory loss and substance abuse. The patient is not nervous/anxious and does not have insomnia.  Blood pressure 110/73, pulse 63, temperature 97.7 F (36.5 C), temperature source Oral, resp. rate 18, height '5\' 7"'  (1.702 m), weight 84.369 kg (186 lb), SpO2 96 %.Body mass index is 29.12 kg/(m^2).  General Appearance: Disheveled  Engineer, water::  Fair  Speech:  Normal Rate  Volume:  Normal  Mood:  Euthymic  Affect:  Full Range  Thought Process:  Goal Directed  Orientation:  Full (Time, Place, and Person)  Thought Content:  Negative  Suicidal Thoughts:  No  Homicidal Thoughts:  No  Memory:  Immediate;   Fair Recent;   Fair Remote;   Fair  Judgement:  Fair  Insight:  Fair  Psychomotor Activity:  Normal  Concentration:  Fair  Recall:  [employer] of Knowledge:Fair  Language: Fair  Akathisia:  No  Handed:  Right  AIMS (if indicated):     Assets:  Communication Skills Desire for Improvement Resilience  ADL's:  Intact  Cognition: Impaired,  Mild  Sleep:      Treatment Plan Summary: Plan Patient currently appears to be calm and lucid and nonpsychotic. Denies suicidal thoughts. Did not actually try and harm himself before coming in here. He is able to express an appropriate plan for taking care of  himself by [facility]. Patient states that he has his usual medications on hand stored in the compartment of his moped. There is no indication for continued commitment. Patient was counseled about the importance of staying on his medicine and continuing to go see his outpatient psychiatric provider and he agrees to the plan. Case reviewed with emergency room doctor. IVC discontinued and patient can be released home.  Disposition: Patient does not meet criteria for psychiatric inpatient admission. Supportive therapy provided about ongoing stressors.  John Clapacs 01/15/2015 3:16 PM

## 2015-01-15 NOTE — Progress Notes (Signed)
Referral information for Placement has been faxed to;     [facility]([phone]) - No beds   [facility]([phone]) No beds but accepting referrals information has been faxed  [facility]([phone])- No Male beds    [facility] - Information faxed- TTS Spoke with AC on call (TIna)   [facility]- Faxed  01/15/2015 Nicole Nikkie Liming, MS, NCC, LPCA Therapeutic Triage Specialist 

## 2015-01-15 NOTE — ED Notes (Signed)
Patient cooperative with all nursing interventions.  Patient says he is feeling better, he just came here to get medications adjusted. He states he will live with another family member.  Maintained on 15 minute checks and observation by security camera for safety.

## 2015-01-15 NOTE — ED Notes (Signed)
Meal given. Patient in no acute distress. Maintained on 15 minute checks and observation by security camera for safety. 

## 2015-01-16 HISTORY — PX: HERNIA REPAIR: SHX51

## 2015-01-18 ENCOUNTER — Observation Stay
Admission: EM | Admit: 2015-01-18 | Discharge: 2015-01-20 | Disposition: A | Discharge status: Home / Self Care | Payer: Medicaid Other | Attending: Internal Medicine | Admitting: Internal Medicine

## 2015-01-18 ENCOUNTER — Emergency Department: Payer: Medicaid Other

## 2015-01-18 DIAGNOSIS — Z79899 Other long term (current) drug therapy: Secondary | ICD-10-CM | POA: Insufficient documentation

## 2015-01-18 DIAGNOSIS — I10 Essential (primary) hypertension: Secondary | ICD-10-CM | POA: Diagnosis not present

## 2015-01-18 DIAGNOSIS — R000 Tachycardia, unspecified: Secondary | ICD-10-CM | POA: Insufficient documentation

## 2015-01-18 DIAGNOSIS — R0602 Shortness of breath: Secondary | ICD-10-CM | POA: Diagnosis not present

## 2015-01-18 DIAGNOSIS — R109 Unspecified abdominal pain: Secondary | ICD-10-CM | POA: Diagnosis not present

## 2015-01-18 DIAGNOSIS — R599 Enlarged lymph nodes, unspecified: Secondary | ICD-10-CM | POA: Insufficient documentation

## 2015-01-18 DIAGNOSIS — H538 Other visual disturbances: Secondary | ICD-10-CM | POA: Insufficient documentation

## 2015-01-18 DIAGNOSIS — Z833 Family history of diabetes mellitus: Secondary | ICD-10-CM | POA: Diagnosis not present

## 2015-01-18 DIAGNOSIS — Z23 Encounter for immunization: Secondary | ICD-10-CM | POA: Insufficient documentation

## 2015-01-18 DIAGNOSIS — J189 Pneumonia, unspecified organism: Secondary | ICD-10-CM | POA: Insufficient documentation

## 2015-01-18 DIAGNOSIS — R110 Nausea: Secondary | ICD-10-CM | POA: Insufficient documentation

## 2015-01-18 DIAGNOSIS — J45909 Unspecified asthma, uncomplicated: Secondary | ICD-10-CM | POA: Diagnosis not present

## 2015-01-18 DIAGNOSIS — F410 Panic disorder [episodic paroxysmal anxiety] without agoraphobia: Secondary | ICD-10-CM | POA: Diagnosis not present

## 2015-01-18 DIAGNOSIS — F17210 Nicotine dependence, cigarettes, uncomplicated: Secondary | ICD-10-CM | POA: Diagnosis not present

## 2015-01-18 DIAGNOSIS — J8410 Pulmonary fibrosis, unspecified: Secondary | ICD-10-CM | POA: Insufficient documentation

## 2015-01-18 DIAGNOSIS — R079 Chest pain, unspecified: Secondary | ICD-10-CM | POA: Diagnosis not present

## 2015-01-18 DIAGNOSIS — R05 Cough: Secondary | ICD-10-CM | POA: Diagnosis not present

## 2015-01-18 DIAGNOSIS — F319 Bipolar disorder, unspecified: Secondary | ICD-10-CM | POA: Insufficient documentation

## 2015-01-18 DIAGNOSIS — E785 Hyperlipidemia, unspecified: Secondary | ICD-10-CM | POA: Diagnosis not present

## 2015-01-18 DIAGNOSIS — R51 Headache: Secondary | ICD-10-CM | POA: Diagnosis not present

## 2015-01-18 DIAGNOSIS — K219 Gastro-esophageal reflux disease without esophagitis: Secondary | ICD-10-CM | POA: Insufficient documentation

## 2015-01-18 HISTORY — DX: Unspecified asthma, uncomplicated: J45.909

## 2015-01-18 HISTORY — DX: Essential (primary) hypertension: I10

## 2015-01-18 LAB — COMPREHENSIVE METABOLIC PANEL
ALT: 14 U/L — AB (ref 17–63)
ANION GAP: 6 (ref 5–15)
AST: 17 U/L (ref 15–41)
Albumin: 3.8 g/dL (ref 3.5–5.0)
Alkaline Phosphatase: 60 U/L (ref 38–126)
BUN: 11 mg/dL (ref 6–20)
CHLORIDE: 103 mmol/L (ref 101–111)
CO2: 33 mmol/L — ABNORMAL HIGH (ref 22–32)
CREATININE: 1.13 mg/dL (ref 0.61–1.24)
Calcium: 9 mg/dL (ref 8.9–10.3)
Glucose, Bld: 107 mg/dL — ABNORMAL HIGH (ref 65–99)
Potassium: 3.8 mmol/L (ref 3.5–5.1)
SODIUM: 142 mmol/L (ref 135–145)
Total Bilirubin: 0.3 mg/dL (ref 0.3–1.2)
Total Protein: 7.6 g/dL (ref 6.5–8.1)

## 2015-01-18 LAB — BRAIN NATRIURETIC PEPTIDE: B NATRIURETIC PEPTIDE 5: 18 pg/mL (ref 0.0–100.0)

## 2015-01-18 LAB — CBC
HCT: 42.4 % (ref 40.0–52.0)
Hemoglobin: 14.1 g/dL (ref 13.0–18.0)
MCH: 30.8 pg (ref 26.0–34.0)
MCHC: 33.3 g/dL (ref 32.0–36.0)
MCV: 92.6 fL (ref 80.0–100.0)
PLATELETS: 165 10*3/uL (ref 150–440)
RBC: 4.58 MIL/uL (ref 4.40–5.90)
RDW: 15.6 % — AB (ref 11.5–14.5)
WBC: 7.8 10*3/uL (ref 3.8–10.6)

## 2015-01-18 LAB — TROPONIN I

## 2015-01-18 NOTE — ED Notes (Signed)
Pt states he has left side chest pain, headache and blurred vision.  Sx began today after taking medicines at 1830 today.  Pt reports nausea. And abd pain also.  Pt alert.  Speech clear.

## 2015-01-18 NOTE — ED Notes (Signed)
Patient presents with complaints of chest pain, anxiety attack, "I can't see good", I get "all stressed out." Patient able to speak in complete sentences without difficulty.

## 2015-01-19 ENCOUNTER — Emergency Department: Payer: Medicaid Other

## 2015-01-19 DIAGNOSIS — R079 Chest pain, unspecified: Secondary | ICD-10-CM | POA: Diagnosis present

## 2015-01-19 LAB — TROPONIN I
Troponin I: <0.03 ng/mL (ref <0.031)
Troponin I: <0.03 ng/mL (ref <0.031)
Troponin I: <0.03 ng/mL (ref <0.031)

## 2015-01-19 MED ORDER — LORATADINE 10 MG PO TABS
10.0000 mg | ORAL_TABLET | Freq: Every day | ORAL | Status: DC
  Administered 2015-01-19 – 2015-01-20 (×2): 10 mg via ORAL
  Filled 2015-01-19 (×2): qty 1

## 2015-01-19 MED ORDER — ACETAMINOPHEN 650 MG RE SUPP: 650.0000 mg | Freq: Four times a day (QID) | RECTAL | Status: DC | PRN

## 2015-01-19 MED ORDER — DILTIAZEM HCL 30 MG PO TABS
30.0000 mg | ORAL_TABLET | Freq: Four times a day (QID) | ORAL | Status: DC
  Filled 2015-01-19: qty 1

## 2015-01-19 MED ORDER — ATORVASTATIN CALCIUM 10 MG PO TABS
10.0000 mg | ORAL_TABLET | Freq: Every evening | ORAL | Status: DC
  Administered 2015-01-19: 10 mg via ORAL
  Filled 2015-01-19: qty 1

## 2015-01-19 MED ORDER — METOPROLOL SUCCINATE ER 50 MG PO TB24
50.0000 mg | ORAL_TABLET | Freq: Every day | ORAL | Status: DC
  Administered 2015-01-20: 50 mg via ORAL
  Filled 2015-01-19: qty 1

## 2015-01-19 MED ORDER — SODIUM CHLORIDE 0.9 % IJ SOLN
3.0000 mL | Freq: Two times a day (BID) | INTRAMUSCULAR | Status: DC
  Administered 2015-01-19 – 2015-01-20 (×4): 3 mL via INTRAVENOUS

## 2015-01-19 MED ORDER — VENLAFAXINE HCL ER 75 MG PO CP24
150.0000 mg | ORAL_CAPSULE | Freq: Every day | ORAL | Status: DC
  Administered 2015-01-19 – 2015-01-20 (×2): 150 mg via ORAL
  Filled 2015-01-19 (×2): qty 2

## 2015-01-19 MED ORDER — ASPIRIN EC 81 MG PO TBEC
81.0000 mg | DELAYED_RELEASE_TABLET | Freq: Every day | ORAL | Status: DC
  Administered 2015-01-19 – 2015-01-20 (×2): 81 mg via ORAL
  Filled 2015-01-19 (×2): qty 1

## 2015-01-19 MED ORDER — FLUOXETINE HCL 20 MG PO CAPS
20.0000 mg | ORAL_CAPSULE | Freq: Two times a day (BID) | ORAL | Status: DC
  Administered 2015-01-19 – 2015-01-20 (×4): 20 mg via ORAL
  Filled 2015-01-19 (×4): qty 1

## 2015-01-19 MED ORDER — PNEUMOCOCCAL VAC POLYVALENT 25 MCG/0.5ML IJ INJ
0.5000 mL | INJECTION | INTRAMUSCULAR | Status: AC
  Administered 2015-01-20: 0.5 mL via INTRAMUSCULAR
  Filled 2015-01-19: qty 0.5

## 2015-01-19 MED ORDER — HEPARIN SODIUM (PORCINE) 5000 UNIT/ML IJ SOLN
5000.0000 [IU] | Freq: Three times a day (TID) | INTRAMUSCULAR | Status: DC
  Administered 2015-01-19 – 2015-01-20 (×4): 5000 [IU] via SUBCUTANEOUS
  Filled 2015-01-19 (×4): qty 1

## 2015-01-19 MED ORDER — LEVOFLOXACIN IN D5W 500 MG/100ML IV SOLN
500.0000 mg | Freq: Once | INTRAVENOUS | Status: AC
  Administered 2015-01-19: 500 mg via INTRAVENOUS
  Filled 2015-01-19: qty 100

## 2015-01-19 MED ORDER — LEVOFLOXACIN IN D5W 500 MG/100ML IV SOLN
500.0000 mg | INTRAVENOUS | Status: DC
  Administered 2015-01-20: 500 mg via INTRAVENOUS
  Filled 2015-01-19 (×2): qty 100

## 2015-01-19 MED ORDER — OXYCODONE HCL 5 MG PO TABS
5.0000 mg | ORAL_TABLET | ORAL | Status: DC | PRN
  Administered 2015-01-19 – 2015-01-20 (×4): 5 mg via ORAL
  Filled 2015-01-19 (×4): qty 1

## 2015-01-19 MED ORDER — IPRATROPIUM-ALBUTEROL 0.5-2.5 (3) MG/3ML IN SOLN
3.0000 mL | Freq: Once | RESPIRATORY_TRACT | Status: AC
  Administered 2015-01-19: 3 mL via RESPIRATORY_TRACT
  Filled 2015-01-19: qty 3

## 2015-01-19 MED ORDER — METAXALONE 800 MG PO TABS: 800.0000 mg | ORAL_TABLET | Freq: Three times a day (TID) | ORAL | Status: DC

## 2015-01-19 MED ORDER — LEVOFLOXACIN 500 MG PO TABS: 500.0000 mg | ORAL_TABLET | Freq: Every day | ORAL | Status: DC

## 2015-01-19 MED ORDER — INFLUENZA VAC SPLIT QUAD 0.5 ML IM SUSY
0.5000 mL | PREFILLED_SYRINGE | INTRAMUSCULAR | Status: AC
  Administered 2015-01-20: 0.5 mL via INTRAMUSCULAR
  Filled 2015-01-19: qty 0.5

## 2015-01-19 MED ORDER — MORPHINE SULFATE (PF) 2 MG/ML IV SOLN
2.0000 mg | INTRAVENOUS | Status: DC | PRN
  Administered 2015-01-19: 2 mg via INTRAVENOUS
  Filled 2015-01-19: qty 1

## 2015-01-19 MED ORDER — FLUTICASONE PROPIONATE 50 MCG/ACT NA SUSP
2.0000 | Freq: Every day | NASAL | Status: DC
  Administered 2015-01-19 – 2015-01-20 (×2): 2 via NASAL
  Filled 2015-01-19: qty 16

## 2015-01-19 MED ORDER — CLONAZEPAM 0.5 MG PO TABS: 0.5000 mg | ORAL_TABLET | Freq: Three times a day (TID) | ORAL | Status: DC | PRN

## 2015-01-19 MED ORDER — ACETAMINOPHEN 325 MG PO TABS
650.0000 mg | ORAL_TABLET | Freq: Four times a day (QID) | ORAL | Status: DC | PRN
Start: 2015-01-19 — End: 2015-01-20

## 2015-01-19 MED ORDER — METHYLPREDNISOLONE SODIUM SUCC 125 MG IJ SOLR
125.0000 mg | Freq: Once | INTRAMUSCULAR | Status: AC
  Administered 2015-01-19: 125 mg via INTRAVENOUS
  Filled 2015-01-19: qty 2

## 2015-01-19 MED ORDER — BUSPIRONE HCL 10 MG PO TABS
15.0000 mg | ORAL_TABLET | Freq: Three times a day (TID) | ORAL | Status: DC
  Administered 2015-01-19 – 2015-01-20 (×4): 15 mg via ORAL
  Filled 2015-01-19 (×4): qty 2

## 2015-01-19 MED ORDER — NITROGLYCERIN 2 % TD OINT
0.5000 [in_us] | TOPICAL_OINTMENT | Freq: Four times a day (QID) | TRANSDERMAL | Status: DC
  Administered 2015-01-19 – 2015-01-20 (×3): 0.5 [in_us] via TOPICAL
  Filled 2015-01-19 (×4): qty 1

## 2015-01-19 MED ORDER — ONDANSETRON HCL 4 MG/2ML IJ SOLN: 4.0000 mg | Freq: Four times a day (QID) | INTRAMUSCULAR | Status: DC | PRN

## 2015-01-19 MED ORDER — IOHEXOL 350 MG/ML SOLN
100.0000 mL | Freq: Once | INTRAVENOUS | Status: AC | PRN
  Administered 2015-01-19: 100 mL via INTRAVENOUS

## 2015-01-19 MED ORDER — NICOTINE 14 MG/24HR TD PT24
14.0000 mg | MEDICATED_PATCH | Freq: Every day | TRANSDERMAL | Status: DC
  Administered 2015-01-19 – 2015-01-20 (×2): 14 mg via TRANSDERMAL
  Filled 2015-01-19 (×2): qty 1

## 2015-01-19 MED ORDER — ONDANSETRON HCL 4 MG PO TABS: 4.0000 mg | ORAL_TABLET | Freq: Four times a day (QID) | ORAL | Status: DC | PRN

## 2015-01-19 MED ORDER — GABAPENTIN 300 MG PO CAPS
300.0000 mg | ORAL_CAPSULE | Freq: Two times a day (BID) | ORAL | Status: DC
  Administered 2015-01-19 – 2015-01-20 (×4): 300 mg via ORAL
  Filled 2015-01-19 (×4): qty 1

## 2015-01-19 MED ORDER — PANTOPRAZOLE SODIUM 40 MG PO TBEC
40.0000 mg | DELAYED_RELEASE_TABLET | Freq: Every day | ORAL | Status: DC
  Administered 2015-01-19: 40 mg via ORAL
  Filled 2015-01-19: qty 1

## 2015-01-19 NOTE — Progress Notes (Signed)
Arrival Method: via stretcher with ED tech Mental Orientation: A&O Telemetry: MX40-03 Skin: intact, excoriated/redness in groin on both sides. Verified by Maureen Chattersachel McRae, RN IV: 20g Right forearm Pain:  10-10 right sided chest pain- has oxycodone & morphine PRN for pain, will give oxycodone first. Safety Measures: Safety Fall Prevention Plan has been given, discussed & signed, non skid socks in place, bed alarm activated. 2A Orientation: Patient has been orientated to the room, unit & staff.   Orders have been reviewed & implemented. Will continue to monitor the patient. Call light has been placed within reach.  Eden LatheLexi Miller, RN

## 2015-01-19 NOTE — Progress Notes (Signed)
Dr. Luberta MutterKonidena made aware of BP. Per MD okay to hold metoprolol.   Filed Vitals:   01/19/15 0851  BP: 104/69  Pulse: 73  Temp:   Resp:

## 2015-01-19 NOTE — Progress Notes (Signed)
Patient is requesting Nicotine patch. Per Dr. Luberta MutterKonidena okay to order nicotine patch.

## 2015-01-19 NOTE — Discharge Summary (Signed)
[name], is a 49 y.o. male  DOB 07-Mar-1966  MRN [id number].  Admission date:  01/18/2015  Admitting Physician  Wyatt Haste, MD  Discharge Date:  01/19/2015   Primary MD  Lanier Ensign KEY, MD  Recommendations for primary care physician for things to follow:  Follow-up with primary doctor in 1 week   Admission Diagnosis  cp anxiety   Discharge Diagnosis  cp anxiety    Principal Problem:   Left sided chest pain      Past Medical History  Diagnosis Date  . Polysubstance abuse   . Bipolar disorder (HCC)   . Esophageal stricture   . H/O tonic-clonic seizures   . Anxiety   . GERD (gastroesophageal reflux disease)   . Chronic pain   . Asthma   . Hypertension     Past Surgical History  Procedure Laterality Date  . Appendectomy      complicated by perforation and abscess  . Peg w/tracheostomy placement    . Peg tube removal    . Tracheostomy closure    . Esophagogastroduodenoscopy (egd) with esophageal dilation    . Hernia repair  Nov 2016    ventral hernia repair at [facility] by Dr. Genene Churn       History of present illness and  Hospital Course:     Kindly see H&P for history of present illness and admission details, please review complete Labs, Consult reports and Test reports for all details in brief  HPI  from the history and physical done on the day of admission 49 year old male patient with hyperlipidemia, hypertension comes in because of the chest pain mainly squeezing type. Admitted for chest pain rule out.   Hospital Course   #1 chest pain ; likely due to anxiety ;troponins are negative for 3 times. Went for [facility] stress test. If the results are negative patient will be discharged home. started aspirin, beta blockers, nitrates. Also has musculoskeletal chest pain because patient chest  pain is more when he moves. And take Motrin 400 mg 3 times a day as needed. 2.Anixiety,panic attacks; continue BuSpar, Klonopin, and venlafaxine. #3 essential hypertension controlled continue metoprolol., Cardizem. History of tobacco abuse counseled. . For mild case of community-acquired pneumonia: Patient is started on Levaquin, I wrote prescription for Levaquin for 3 days.  Discharge Condition: Stable stable   Follow UP    Follow-up with primary doctor in 1 week.  Discharge Instructions  and  Discharge Medications        Medication List    TAKE these medications        atorvastatin 10 MG tablet  Commonly known as:  LIPITOR  Take 10 mg by mouth every evening.     busPIRone 15 MG tablet  Commonly known as:  BUSPAR  Take 1 tablet (15 mg total) by mouth 3 (three) times daily.     clonazePAM 0.5 MG tablet  Commonly known as:  KLONOPIN  Take 1 tablet (0.5 mg total) by mouth 3 (three) times daily as needed.     diltiazem 30 MG tablet  Commonly known as:  CARDIZEM  Take 1 tablet (30 mg total) by mouth 4 (four) times daily.     FLUoxetine 20 MG capsule  Commonly known as:  PROZAC  Take 20 mg by mouth 2 (two) times daily.     fluticasone 50 MCG/ACT nasal spray  Commonly known as:  FLONASE  Place 2 sprays into both nostrils daily.     gabapentin 300  MG capsule  Commonly known as:  NEURONTIN  Take 1 capsule (300 mg total) by mouth 2 (two) times daily.     HYDROcodone-acetaminophen 5-325 MG tablet  Commonly known as:  NORCO/VICODIN  Take 1-2 tablets by mouth every 4 (four) hours as needed for moderate pain.     levofloxacin 500 MG tablet  Commonly known as:  LEVAQUIN  Take 1 tablet (500 mg total) by mouth daily.     loratadine 10 MG tablet  Commonly known as:  CLARITIN  Take 10 mg by mouth daily.     metaxalone 800 MG tablet  Commonly known as:  SKELAXIN  Take 1 tablet (800 mg total) by mouth 3 (three) times daily.     metoprolol succinate 50 MG 24 hr tablet   Commonly known as:  TOPROL-XL  Take 50 mg by mouth daily.     pantoprazole 40 MG tablet  Commonly known as:  PROTONIX  Take 1 tablet (40 mg total) by mouth daily.     venlafaxine XR 150 MG 24 hr capsule  Commonly known as:  EFFEXOR-XR  Take 1 capsule (150 mg total) by mouth daily with breakfast.          Diet and Activity recommendation: See Discharge Instructions above   Consults obtained - none   Major procedures and Radiology Reports - PLEASE review detailed and final reports for all details, in brief -     Dg Chest 2 View  01/18/2015  CLINICAL DATA:  Chest pain and anxiety attacks. Shortness of breath and cough. EXAM: CHEST  2 VIEW COMPARISON:  01/01/2015 FINDINGS: Normal heart size and pulmonary vascularity. Diffuse interstitial pattern in the lungs may represent chronic fibrosis or acute interstitial edema or acute interstitial pneumonitis. Appearance is similar to previous study. No developing consolidation or airspace disease. No blunting of costophrenic angles. No pneumothorax. Mild hyperinflation suggesting emphysema. Degenerative changes in the spine. Mediastinal contours appear intact. Calcified granulomas in the upper lungs. IMPRESSION: Nonspecific interstitial pattern to the lungs may indicate chronic fibrosis or acute edema/pneumonitis. Emphysematous changes. No acute consolidation. Electronically Signed   By: Burman Nieves M.D.   On: 01/18/2015 20:39   Ct Angio Chest Pe W/cm &/or Wo Cm  01/19/2015  CLINICAL DATA:  Left-sided chest pain, headache, and blurred vision. Nausea and abdominal pain. EXAM: CT ANGIOGRAPHY CHEST WITH CONTRAST TECHNIQUE: Multidetector CT imaging of the chest was performed using the standard protocol during bolus administration of intravenous contrast. Multiplanar CT image reconstructions and MIPs were obtained to evaluate the vascular anatomy. CONTRAST:  OMNIPAQUE IOHEXOL 350 MG/ML SOLN COMPARISON:  06/24/2013 FINDINGS: Technically  adequate study with good opacification of the central and segmental pulmonary arteries. No focal filling defects demonstrated. No evidence of significant pulmonary embolus. Normal heart size. Normal caliber thoracic aorta. No aortic dissection. Great vessel origins are patent. Enlarged lymph nodes demonstrated throughout the mediastinum and in both hila. Largest right paratracheal lymph node measures about 1.8 cm short axis dimension. Subcarinal lymph nodes measure up to 2.2 cm short axis dimension. Scattered calcifications are demonstrated throughout the lymph nodes. This suggest probable postinflammatory change. Lymphadenopathy was present on the previous study is well. Esophagus is decompressed. Calcified granulomas in the lungs. Fine reticular nodular pattern to the lungs is new since previous study. This could indicate interstitial edema or interstitial pneumonitis. Given presence of calcifications, consider possible reactivation miliary TB. No focal consolidation. No pleural effusions. No pneumothorax. Included portions of the upper abdominal organs are grossly unremarkable. Degenerative  changes in the spine. No destructive bone lesions. Review of the MIP images confirms the above findings. IMPRESSION: Enlarged calcified lymph nodes in the hila and mediastinum consistent with postinflammatory etiology. Calcified granulomas in the lungs. Knee reticular nodular interstitial changes suggest interstitial edema or interstitial pneumonia. Consider possible reactivation TB. No evidence of significant pulmonary embolus. Electronically Signed   By: Burman NievesWilliam  Stevens M.D.   On: 01/19/2015 01:13   Dg Chest Portable 1 View  12/29/2014  CLINICAL DATA:  Per EMS, pt states he has had intermittent chest pain all day since 8am that gets worse with sitting up. He states the pain radiates from his central chest to the bottom of an incision he has at the bottom of his stomach. He al.*comment was truncated* EXAM: PORTABLE CHEST  1 VIEW COMPARISON:  05/30/2014 FINDINGS: Midline trachea. Normal heart size. No pleural effusion or pneumothorax. Clear lungs. Diffuse peribronchial thickening. IMPRESSION: 1. No acute cardiopulmonary disease. 2. Mild peribronchial thickening which may relate to chronic bronchitis or smoking. Electronically Signed   By: Jeronimo GreavesKyle  Talbot M.D.   On: 12/29/2014 21:40   Dg Abd 2 Views  01/14/2015  CLINICAL DATA:  Abdominal discomfort, psychiatric evaluation EXAM: ABDOMEN - 2 VIEW COMPARISON:  01/01/2015 FINDINGS: Nonobstructive bowel gas pattern. No evidence of free air under the diaphragm on the upright view. Degenerative changes of the lumbar spine. IMPRESSION: No evidence of small bowel obstruction or free air. Electronically Signed   By: Charline BillsSriyesh  Krishnan M.D.   On: 01/14/2015 14:44   Dg Abd Acute W/chest  01/01/2015  CLINICAL DATA:  Bi suicidal ideation. EXAM: DG ABDOMEN ACUTE W/ 1V CHEST COMPARISON:  Radiograph 12/29/2014, CT 05/27/2014 FINDINGS: There is linear interstitial markings the upper lobes are slightly increased compared to prior. No pneumothorax. No fracture. No dilated loops of large or small bowel. No organomegaly. No evidence of fracture. There is a densely sclerotic lesion in the RIGHT iliac bone not changed from prior. IMPRESSION: 1. Interstitial pattern suggests interstitial edema. 2. No evidence of bowel obstruction or free air. Electronically Signed   By: Genevive BiStewart  Edmunds M.D.   On: 01/01/2015 17:52    Micro Results    No results found for this or any previous visit (from the past 240 hour(s)).     Today   Subjective:   [name] today has no headache,no chest abdominal pain,no new weakness tingling or numbness, feels much better wants to go home today.   Objective:   Blood pressure 113/67, pulse 80, temperature 97.6 F (36.4 C), temperature source Oral, resp. rate 18, height 5\' 7"  (1.702 m), weight 84.505 kg (186 lb 4.8 oz), SpO2 93 %.   Intake/Output Summary (Last  24 hours) at 01/19/15 1411 Last data filed at 01/19/15 1322  Gross per 24 hour  Intake      6 ml  Output   1150 ml  Net  -1144 ml    Exam Awake Alert, Oriented x 3, No new F.N deficits, Normal affect [place].AT,PERRAL Supple Neck,No JVD, No cervical lymphadenopathy appriciated.  Symmetrical Chest wall movement, Good air movement bilaterally, CTAB RRR,No Gallops,Rubs or new Murmurs, No Parasternal Heave +ve B.Sounds, Abd Soft, Non tender, No organomegaly appriciated, No rebound -guarding or rigidity. No Cyanosis, Clubbing or edema, No new Rash or bruise  Data Review   CBC w Diff: Lab Results  Component Value Date   WBC 7.8 01/18/2015   WBC 9.8 01/13/2014   HGB 14.1 01/18/2015   HGB 15.7 01/13/2014   HCT 42.4  01/18/2015   HCT 48.3 01/13/2014   PLT 165 01/18/2015   PLT 200 01/13/2014   LYMPHOPCT 20.7 01/05/2014   MONOPCT 10.6 01/05/2014   EOSPCT 2.8 01/05/2014   BASOPCT 0.9 01/05/2014    CMP: Lab Results  Component Value Date   NA 142 01/18/2015   NA 141 01/13/2014   K 3.8 01/18/2015   K 4.0 01/13/2014   CL 103 01/18/2015   CL 105 01/13/2014   CO2 33* 01/18/2015   CO2 24 01/13/2014   BUN 11 01/18/2015   BUN 15 01/13/2014   CREATININE 1.13 01/18/2015   CREATININE 0.81 01/13/2014   PROT 7.6 01/18/2015   PROT 9.2* 01/13/2014   ALBUMIN 3.8 01/18/2015   ALBUMIN 3.8 01/13/2014   BILITOT 0.3 01/18/2015   BILITOT 0.5 01/13/2014   ALKPHOS 60 01/18/2015   ALKPHOS 81 01/13/2014   AST 17 01/18/2015   AST 30 01/13/2014   ALT 14* 01/18/2015   ALT 19 01/13/2014  .   Total Time in preparing paper work, data evaluation and todays exam - 35 minutes  Cherilynn Schomburg M.D on 01/19/2015 at 2:11 PM    Note: This dictation was prepared with Dragon dictation along with smaller phrase technology. Any transcriptional errors that result from this process are unintentional.

## 2015-01-19 NOTE — ED Provider Notes (Signed)
[facility] Emergency Department Provider Note  ____________________________________________  Time seen: 12:15 AM  I have reviewed the triage vital signs and the nursing notes.   HISTORY  Chief Complaint Panic Attack and Chest Pain     HPI [name] is a 49 y.o. malepresents with left-sided chest pain and shortness of breath with onset approximately 6:30 PM yesterday evening. She states current pain score is 8 out of 10. Patient states "I just can't catch my breath"   Past Medical History  Diagnosis Date  . Polysubstance abuse   . Bipolar disorder (HCC)   . Esophageal stricture   . H/O tonic-clonic seizures   . Anxiety   . GERD (gastroesophageal reflux disease)   . Chronic pain     Patient Active Problem List   Diagnosis Date Noted  . Adjustment disorder with depressed mood 01/15/2015  . Dyslipidemia 01/03/2015  . Tobacco use disorder 01/03/2015  . Major depressive disorder, recurrent severe without psychotic features (HCC) 01/02/2015  . Hypertension 01/02/2015  . Gastric reflux 01/02/2015    Past Surgical History  Procedure Laterality Date  . Appendectomy      complicated by perforation and abscess  . Peg w/tracheostomy placement    . Peg tube removal    . Tracheostomy closure    . Esophagogastroduodenoscopy (egd) with esophageal dilation    . Hernia repair  Nov 2016    ventral hernia repair at [facility] by Dr. Genene Churn    Current Outpatient Rx  Name  Route  Sig  Dispense  Refill  . atorvastatin (LIPITOR) 10 MG tablet   Oral   Take 10 mg by mouth every evening.         . busPIRone (BUSPAR) 15 MG tablet   Oral   Take 1 tablet (15 mg total) by mouth 3 (three) times daily.   90 tablet   0   . clonazePAM (KLONOPIN) 0.5 MG tablet   Oral   Take 1 tablet (0.5 mg total) by mouth 3 (three) times daily as needed. Patient taking differently: Take 0.5 mg by mouth 3 (three) times daily as needed for anxiety.    30 tablet   0   .  diltiazem (CARDIZEM) 30 MG tablet   Oral   Take 1 tablet (30 mg total) by mouth 4 (four) times daily.   120 tablet   0   . ferrous sulfate 325 (65 FE) MG tablet   Oral   Take 325 mg by mouth daily.         . fluticasone (FLONASE) 50 MCG/ACT nasal spray   Each Nare   Place 2 sprays into both nostrils daily.         Marland Kitchen gabapentin (NEURONTIN) 300 MG capsule   Oral   Take 1 capsule (300 mg total) by mouth 2 (two) times daily.   60 capsule   0   . loratadine (CLARITIN) 10 MG tablet   Oral   Take 10 mg by mouth daily.         . metoprolol succinate (TOPROL-XL) 50 MG 24 hr tablet   Oral   Take 50 mg by mouth daily.         . pantoprazole (PROTONIX) 40 MG tablet   Oral   Take 1 tablet (40 mg total) by mouth daily.   30 tablet   0   . venlafaxine XR (EFFEXOR-XR) 150 MG 24 hr capsule   Oral   Take 1 capsule (150 mg total) by  mouth daily with breakfast.   30 capsule   0     Allergies No known drug allergies No family history on file.  Social History Social History  Substance Use Topics  . Smoking status: Current Every Day Smoker -- 1.00 packs/day    Types: Cigarettes  . Smokeless tobacco: Not on file  . Alcohol Use: No    Review of Systems  Constitutional: Negative for fever. Eyes: Negative for visual changes. ENT: Negative for sore throat. Cardiovascular: positive for chest pain. Respiratory:positive for shortness of breath. Gastrointestinal: Negative for abdominal pain, vomiting and diarrhea. Genitourinary: Negative for dysuria. Musculoskeletal: Negative for back pain. Skin: Negative for rash. Neurological: Negative for headaches, focal weakness or numbness.   10-point ROS otherwise negative.  ____________________________________________   PHYSICAL EXAM:  VITAL SIGNS: ED Triage Vitals  Enc Vitals Group     BP 01/18/15 1954 138/91 mmHg     Pulse Rate 01/18/15 1954 95     Resp 01/18/15 1954 20     Temp 01/18/15 1954 98.5 F (36.9 C)      Temp Source 01/18/15 1954 Oral     SpO2 01/18/15 1954 96 %     Weight 01/18/15 1954 187 lb (84.823 kg)     Height 01/18/15 1954 5\' 7"  (1.702 m)     Head Cir --      Peak Flow --      Pain Score 01/18/15 1955 10     Pain Loc --      Pain Edu? --      Excl. in GC? --      Constitutional: Alert and oriented. Well appearing and in no distress. Eyes: Conjunctivae are normal. PERRL. Normal extraocular movements. ENT   Head: Normocephalic and atraumatic.   Nose: No congestion/rhinnorhea.   Mouth/Throat: Mucous membranes are moist.   Neck: No stridor. Cardiovascular: tachycardia, regular rhythm. Normal and symmetric distal pulses are present in all extremities. No murmurs, rubs, or gallops. Respiratory: tachypnea. Diffuse bilateral rhonchi and wheezes. No wheezes/rales/rhonchi. Gastrointestinal: Soft and nontender. No distention. There is no CVA tenderness. Genitourinary: deferred Musculoskeletal: Nontender with normal range of motion in all extremities. No joint effusions.  No lower extremity tenderness nor edema. Neurologic:  Normal speech and language. No gross focal neurologic deficits are appreciated. Speech is normal.  Skin:  Skin is warm, dry and intact. No rash noted. Psychiatric: Mood and affect are normal. Speech and behavior are normal. Patient exhibits appropriate insight and judgment.  ____________________________________________    LABS (pertinent positives/negatives)  Labs Reviewed  CBC - Abnormal; Notable for the following:    RDW 15.6 (*)    All other components within normal limits  COMPREHENSIVE METABOLIC PANEL - Abnormal; Notable for the following:    CO2 33 (*)    Glucose, Bld 107 (*)    ALT 14 (*)    All other components within normal limits  TROPONIN I  BRAIN NATRIURETIC PEPTIDE       RADIOLOGY CT Angio Chest PE W/Cm &/Or Wo Cm (Final result) Result time: 01/19/15 01:13:53   Final result by Rad Results In Interface (01/19/15 01:13:53)    Narrative:   CLINICAL DATA: Left-sided chest pain, headache, and blurred vision. Nausea and abdominal pain.  EXAM: CT ANGIOGRAPHY CHEST WITH CONTRAST  TECHNIQUE: Multidetector CT imaging of the chest was performed using the standard protocol during bolus administration of intravenous contrast. Multiplanar CT image reconstructions and MIPs were obtained to evaluate the vascular anatomy.  CONTRAST: 100mL OMNIPAQUE IOHEXOL 350  MG/ML SOLN  COMPARISON: 06/24/2013  FINDINGS: Technically adequate study with good opacification of the central and segmental pulmonary arteries. No focal filling defects demonstrated. No evidence of significant pulmonary embolus.  Normal heart size. Normal caliber thoracic aorta. No aortic dissection. Great vessel origins are patent. Enlarged lymph nodes demonstrated throughout the mediastinum and in both hila. Largest right paratracheal lymph node measures about 1.8 cm short axis dimension. Subcarinal lymph nodes measure up to 2.2 cm short axis dimension. Scattered calcifications are demonstrated throughout the lymph nodes. This suggest probable postinflammatory change. Lymphadenopathy was present on the previous study is well. Esophagus is decompressed.  Calcified granulomas in the lungs. Fine reticular nodular pattern to the lungs is new since previous study. This could indicate interstitial edema or interstitial pneumonitis. Given presence of calcifications, consider possible reactivation miliary TB. No focal consolidation. No pleural effusions. No pneumothorax.  Included portions of the upper abdominal organs are grossly unremarkable. Degenerative changes in the spine. No destructive bone lesions.  Review of the MIP images confirms the above findings.  IMPRESSION: Enlarged calcified lymph nodes in the hila and mediastinum consistent with postinflammatory etiology. Calcified granulomas in the lungs. Knee reticular nodular interstitial  changes suggest interstitial edema or interstitial pneumonia. Consider possible reactivation TB. No evidence of significant pulmonary embolus.   Electronically Signed By: Burman Nieves M.D. On: 01/19/2015 01:13          DG Chest 2 View (Final result) Result time: 01/18/15 20:39:08   Final result by Rad Results In Interface (01/18/15 20:39:08)   Narrative:   CLINICAL DATA: Chest pain and anxiety attacks. Shortness of breath and cough.  EXAM: CHEST 2 VIEW  COMPARISON: 01/01/2015  FINDINGS: Normal heart size and pulmonary vascularity. Diffuse interstitial pattern in the lungs may represent chronic fibrosis or acute interstitial edema or acute interstitial pneumonitis. Appearance is similar to previous study. No developing consolidation or airspace disease. No blunting of costophrenic angles. No pneumothorax. Mild hyperinflation suggesting emphysema. Degenerative changes in the spine. Mediastinal contours appear intact. Calcified granulomas in the upper lungs.  IMPRESSION: Nonspecific interstitial pattern to the lungs may indicate chronic fibrosis or acute edema/pneumonitis. Emphysematous changes. No acute consolidation.   Electronically Signed By: Burman Nieves M.D. On: 01/18/2015 20:39        ECG Results      INITIAL IMPRESSION / ASSESSMENT AND PLAN / ED COURSE  Pertinent labs & imaging results that were available during my care of the patient were reviewed by me and considered in my medical decision making (see chart for details).    ____________________________________________   FINAL CLINICAL IMPRESSION(S) / ED DIAGNOSES  Final diagnoses:  Chest pain      Darci Current, MD 01/24/15 [phone]

## 2015-01-19 NOTE — Care Management (Addendum)
Patient is admitted under observation for chest pain.  Patient request CM to help him "get rid of this pain in my chest."  He is currently living with his cousin and [place].  DSS is helping him get section 8 housing.  Receives 400 dollars a month income.  He uses his moped for transportation.  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 he denies having any issues obtaining his medications with his medicaid.  At present, there are no discharge needs identified .  Enc[place] [facility].

## 2015-01-19 NOTE — Progress Notes (Signed)
[facility] [place] at [facility]   PATIENT NAME: [name]    MR#:  [id number]  DATE OF BIRTH:  09/12/1965  SUBJECTIVE:admitted for chest pain.troponins are negative.for stress test tomorrow.chest pain more with movement.  CHIEF COMPLAINT:   Chief Complaint  Patient presents with  . Panic Attack  . Chest Pain    REVIEW OF SYSTEMS:   ROS CONSTITUTIONAL: No fever, fatigue or weakness.  EYES: No blurred or double vision.  EARS, NOSE, AND THROAT: No tinnitus or ear pain.  RESPIRATORY: No cough, shortness of breath, wheezing or hemoptysis.  CARDIOVASCULAR: No chest pain, orthopnea, edema.  GASTROINTESTINAL: No nausea, vomiting, diarrhea or abdominal pain.  GENITOURINARY: No dysuria, hematuria.  ENDOCRINE: No polyuria, nocturia,  HEMATOLOGY: No anemia, easy bruising or bleeding SKIN: No rash or lesion. MUSCULOSKELETAL: No joint pain or arthritis.   NEUROLOGIC: No tingling, numbness, weakness.  PSYCHIATRY: No anxiety or depression.   DRUG ALLERGIES:  No Known Allergies  VITALS:  Blood pressure 100/64, pulse 74, temperature 97.6 F (36.4 C), temperature source Oral, resp. rate 18, height 5\' 7"  (1.702 m), weight 84.505 kg (186 lb 4.8 oz), SpO2 93 %.  PHYSICAL EXAMINATION:  GENERAL:  49 y.o.-year-old patient lying in the bed with no acute distress.  EYES: Pupils equal, round, reactive to light and accommodation. No scleral icterus. Extraocular muscles intact.  HEENT: Head atraumatic, normocephalic. Oropharynx and nasopharynx clear.  NECK:  Supple, no jugular venous distention. No thyroid enlargement, no tenderness.  LUNGS: Normal breath sounds bilaterally, no wheezing, rales,rhonchi or crepitation. No use of accessory muscles of respiration.  CARDIOVASCULAR: S1, S2 normal. No murmurs, rubs, or gallops.  ABDOMEN: Soft, nontender, nondistended. Bowel sounds present. No organomegaly or mass.  EXTREMITIES: No pedal edema, cyanosis, or clubbing.   NEUROLOGIC: Cranial nerves II through XII are intact. Muscle strength 5/5 in all extremities. Sensation intact. Gait not checked.  PSYCHIATRIC: The patient is alert and oriented x 3.  SKIN: No obvious rash, lesion, or ulcer.    LABORATORY PANEL:   CBC  Recent Labs Lab 01/18/15 1957  WBC 7.8  HGB 14.1  HCT 42.4  PLT 165   ------------------------------------------------------------------------------------------------------------------  Chemistries   Recent Labs Lab 01/18/15 1957  NA 142  K 3.8  CL 103  CO2 33*  GLUCOSE 107*  BUN 11  CREATININE 1.13  CALCIUM 9.0  AST 17  ALT 14*  ALKPHOS 60  BILITOT 0.3   ------------------------------------------------------------------------------------------------------------------  Cardiac Enzymes  Recent Labs Lab 01/19/15 1025  TROPONINI <0.03   ------------------------------------------------------------------------------------------------------------------  RADIOLOGY:  Dg Chest 2 View  01/18/2015  CLINICAL DATA:  Chest pain and anxiety attacks. Shortness of breath and cough. EXAM: CHEST  2 VIEW COMPARISON:  01/01/2015 FINDINGS: Normal heart size and pulmonary vascularity. Diffuse interstitial pattern in the lungs may represent chronic fibrosis or acute interstitial edema or acute interstitial pneumonitis. Appearance is similar to previous study. No developing consolidation or airspace disease. No blunting of costophrenic angles. No pneumothorax. Mild hyperinflation suggesting emphysema. Degenerative changes in the spine. Mediastinal contours appear intact. Calcified granulomas in the upper lungs. IMPRESSION: Nonspecific interstitial pattern to the lungs may indicate chronic fibrosis or acute edema/pneumonitis. Emphysematous changes. No acute consolidation. Electronically Signed   By: Burman NievesWilliam  Stevens M.D.   On: 01/18/2015 20:39   Ct Angio Chest Pe W/cm &/or Wo Cm  01/19/2015  CLINICAL DATA:  Left-sided chest pain, headache,  and blurred vision. Nausea and abdominal pain. EXAM: CT ANGIOGRAPHY CHEST WITH CONTRAST TECHNIQUE: Multidetector CT  imaging of the chest was performed using the standard protocol during bolus administration of intravenous contrast. Multiplanar CT image reconstructions and MIPs were obtained to evaluate the vascular anatomy. CONTRAST:  OMNIPAQUE IOHEXOL 350 MG/ML SOLN COMPARISON:  06/24/2013 FINDINGS: Technically adequate study with good opacification of the central and segmental pulmonary arteries. No focal filling defects demonstrated. No evidence of significant pulmonary embolus. Normal heart size. Normal caliber thoracic aorta. No aortic dissection. Great vessel origins are patent. Enlarged lymph nodes demonstrated throughout the mediastinum and in both hila. Largest right paratracheal lymph node measures about 1.8 cm short axis dimension. Subcarinal lymph nodes measure up to 2.2 cm short axis dimension. Scattered calcifications are demonstrated throughout the lymph nodes. This suggest probable postinflammatory change. Lymphadenopathy was present on the previous study is well. Esophagus is decompressed. Calcified granulomas in the lungs. Fine reticular nodular pattern to the lungs is new since previous study. This could indicate interstitial edema or interstitial pneumonitis. Given presence of calcifications, consider possible reactivation miliary TB. No focal consolidation. No pleural effusions. No pneumothorax. Included portions of the upper abdominal organs are grossly unremarkable. Degenerative changes in the spine. No destructive bone lesions. Review of the MIP images confirms the above findings. IMPRESSION: Enlarged calcified lymph nodes in the hila and mediastinum consistent with postinflammatory etiology. Calcified granulomas in the lungs. Knee reticular nodular interstitial changes suggest interstitial edema or interstitial pneumonia. Consider possible reactivation TB. No evidence of significant  pulmonary embolus. Electronically Signed   By: Burman Nieves M.D.   On: 01/19/2015 01:13    EKG:   Orders placed or performed during the hospital encounter of 01/18/15  . EKG 12-Lead  . EKG 12-Lead    ASSESSMENT AND PLAN:  1.chest pain;midsternal;evaluate  For CAD;lexiscan stress test tomorrow,unable to get it done.continue ASA,nitro,bblbocker,check lipid panel  2.anxiety/depression;continue Klonipin,welbutrin, 3.htn;bp on low side;hold Cardizem, likley d/c am if stress test is negative.  All the records are reviewed and case discussed with Care Management/Social Workerr. Management plans discussed with the patient, family and they are in agreement.  CODE STATUS: full  TOTAL TIME TAKING CARE OF THIS PATIENT: 35 minutes.   POSSIBLE D/C IN 1-2 DAYS, DEPENDING ON CLINICAL CONDITION.   Katha Hamming M.D on 01/19/2015 at 3:07 PM  Between 7am to 6pm - Pager - [phone]  After 6pm go to [email or web address] - password [email or web address]  [facility]  Office  [phone]  CC: Primary care physician; Lanier Ensign KEY, MD   Note: This dictation was prepared with Dragon dictation along with smaller phrase technology. Any transcriptional errors that result from this process are unintentional.

## 2015-01-19 NOTE — H&P (Signed)
[facility] [place] [facility]   PATIENT NAME: Ilda Bassetimothy Gorby    MR#:  [id number]  DATE OF BIRTH:  05/20/1965   DATE OF ADMISSION:  01/18/2015  PRIMARY CARE PHYSICIAN: Lanier EnsignSOLES, MEREDITH KEY, MD   REQUESTING/REFERRING PHYSICIAN: Manson PasseyBrown  CHIEF COMPLAINT:   Chief Complaint  Patient presents with  . Panic Attack  . Chest Pain    HISTORY OF PRESENT ILLNESS:  Ilda Bassetimothy Hanneman  is a 49 y.o. male with a known history of hyperlipidemia unspecified, essential hypertension presenting with chest pain. Describes acute onset chest pain which occurred at rest left chest in location, squeezing in quality, 7/10 in intensity no worsening or relieving factors nonradiating. He also describes having shortness of breath mainly dyspnea on exertion with associated nonproductive cough. This is present for maybe 3-4 days, positive subjective chills denies fevers.  PAST MEDICAL HISTORY:   Past Medical History  Diagnosis Date  . Polysubstance abuse   . Bipolar disorder (HCC)   . Esophageal stricture   . H/O tonic-clonic seizures   . Anxiety   . GERD (gastroesophageal reflux disease)   . Chronic pain   . Asthma   . Hypertension     PAST SURGICAL HISTORY:   Past Surgical History  Procedure Laterality Date  . Appendectomy      complicated by perforation and abscess  . Peg w/tracheostomy placement    . Peg tube removal    . Tracheostomy closure    . Esophagogastroduodenoscopy (egd) with esophageal dilation    . Hernia repair  Nov 2016    [facility] by Dr. Genene ChurnBunzendahl    SOCIAL HISTORY:   Social History  Substance Use Topics  . Smoking status: Current Every Day Smoker -- 1.00 packs/day    Types: Cigarettes  . Smokeless tobacco: Not on file  . Alcohol Use: No    FAMILY HISTORY:   Family History  Problem Relation Age of Onset  . Diabetes Neg Hx     DRUG ALLERGIES:  No Known Allergies  REVIEW OF SYSTEMS:  REVIEW OF SYSTEMS:   CONSTITUTIONAL: Denies fevers, positive chills, fatigue, weakness.  EYES: Denies blurred vision, double vision, or eye pain.  EARS, NOSE, THROAT: Denies tinnitus, ear pain, hearing loss.  RESPIRATORY: Positive cough, shortness of breath, denies wheezing  CARDIOVASCULAR: Operative chest pain, denies palpitations, edema.  GASTROINTESTINAL: Denies nausea, vomiting, diarrhea, abdominal pain.  GENITOURINARY: Denies dysuria, hematuria.  ENDOCRINE: Denies nocturia or thyroid problems. HEMATOLOGIC AND LYMPHATIC: Denies easy bruising or bleeding.  SKIN: Denies rash or lesions.  MUSCULOSKELETAL: Denies pain in neck, back, shoulder, knees, hips, or further arthritic symptoms.  NEUROLOGIC: Denies paralysis, paresthesias.  PSYCHIATRIC: Denies anxiety or depressive symptoms. Otherwise full review of systems performed by me is negative.   MEDICATIONS AT HOME:   Prior to Admission medications   Medication Sig Start Date End Date Taking? Authorizing Provider  atorvastatin (LIPITOR) 10 MG tablet Take 10 mg by mouth every evening.   Yes Historical Provider, MD  busPIRone (BUSPAR) 15 MG tablet Take 1 tablet (15 mg total) by mouth 3 (three) times daily. 12/29/14  Yes Loleta Roseory Forbach, MD  clonazePAM (KLONOPIN) 0.5 MG tablet Take 1 tablet (0.5 mg total) by mouth 3 (three) times daily as needed. Patient taking differently: Take 0.5 mg by mouth 3 (three) times daily as needed for anxiety.  12/29/14  Yes Loleta Roseory Forbach, MD  diltiazem (CARDIZEM) 30 MG tablet Take 1 tablet (30 mg total) by mouth 4 (four)  times daily. 12/29/14  Yes Loleta Rose, MD  FLUoxetine (PROZAC) 20 MG capsule Take 20 mg by mouth 2 (two) times daily.   Yes Historical Provider, MD  fluticasone (FLONASE) 50 MCG/ACT nasal spray Place 2 sprays into both nostrils daily.   Yes Historical Provider, MD  gabapentin (NEURONTIN) 300 MG capsule Take 1 capsule (300 mg total) by mouth 2 (two) times daily. 12/29/14  Yes Loleta Rose, MD   HYDROcodone-acetaminophen (NORCO/VICODIN) 5-325 MG tablet Take 1-2 tablets by mouth every 4 (four) hours as needed for moderate pain.   Yes Historical Provider, MD  loratadine (CLARITIN) 10 MG tablet Take 10 mg by mouth daily.   Yes Historical Provider, MD  metoprolol succinate (TOPROL-XL) 50 MG 24 hr tablet Take 50 mg by mouth daily.   Yes Historical Provider, MD  pantoprazole (PROTONIX) 40 MG tablet Take 1 tablet (40 mg total) by mouth daily. 12/29/14  Yes Loleta Rose, MD  venlafaxine XR (EFFEXOR-XR) 150 MG 24 hr capsule Take 1 capsule (150 mg total) by mouth daily with breakfast. 01/05/15  Yes Jolanta B Pucilowska, MD      VITAL SIGNS:  Blood pressure 138/89, pulse 78, temperature 98.5 F (36.9 C), temperature source Oral, resp. rate 13, height  (1.702 m), weight 187 lb (84.823 kg), SpO2 96 %.  PHYSICAL EXAMINATION:  VITAL SIGNS: Filed Vitals:   01/19/15 0200  BP: 138/89  Pulse: 78  Temp:   Resp: 13   GENERAL:49 y.o.male currently in no acute distress.  HEAD: Normocephalic, atraumatic.  EYES: Pupils equal, round, reactive to light. Extraocular muscles intact. No scleral icterus.  MOUTH: Moist mucosal membrane. Dentition intact. No abscess noted.  EAR, NOSE, THROAT: Clear without exudates. No external lesions.  NECK: Supple. No thyromegaly. No nodules. No JVD.  PULMONARY: Clear to ascultation, without wheeze rails or rhonci. No use of accessory muscles, Good respiratory effort. good air entry bilaterally CHEST: tender to palpation however not to the same intensity.  CARDIOVASCULAR: S1 and S2. Regular rate and rhythm. No murmurs, rubs, or gallops. No edema. Pedal pulses 2+ bilaterally.  GASTROINTESTINAL: Soft, nontender, nondistended. No masses. Positive bowel sounds. No hepatosplenomegaly.  MUSCULOSKELETAL: No swelling, clubbing, or edema. Range of motion full in all extremities.  NEUROLOGIC: Cranial nerves II through XII are intact. No gross focal neurological deficits.  Sensation intact. Reflexes intact.  SKIN: No ulceration, lesions, rashes, or cyanosis. Skin warm and dry. Turgor intact.  PSYCHIATRIC: Mood, affect within normal limits. The patient is awake, alert and oriented x 3. Insight, judgment intact.    LABORATORY PANEL:   CBC  Recent Labs Lab 01/18/15 1957  WBC 7.8  HGB 14.1  HCT 42.4  PLT 165   ------------------------------------------------------------------------------------------------------------------  Chemistries   Recent Labs Lab 01/18/15 1957  NA 142  K 3.8  CL 103  CO2 33*  GLUCOSE 107*  BUN 11  CREATININE 1.13  CALCIUM 9.0  AST 17  ALT 14*  ALKPHOS 60  BILITOT 0.3   ------------------------------------------------------------------------------------------------------------------  Cardiac Enzymes  Recent Labs Lab 01/18/15 1957  TROPONINI <0.03   ------------------------------------------------------------------------------------------------------------------  RADIOLOGY:  Dg Chest 2 View  01/18/2015  CLINICAL DATA:  Chest pain and anxiety attacks. Shortness of breath and cough. EXAM: CHEST  2 VIEW COMPARISON:  01/01/2015 FINDINGS: Normal heart size and pulmonary vascularity. Diffuse interstitial pattern in the lungs may represent chronic fibrosis or acute interstitial edema or acute interstitial pneumonitis. Appearance is similar to previous study. No developing consolidation or airspace disease. No blunting of costophrenic angles. No  pneumothorax. Mild hyperinflation suggesting emphysema. Degenerative changes in the spine. Mediastinal contours appear intact. Calcified granulomas in the upper lungs. IMPRESSION: Nonspecific interstitial pattern to the lungs may indicate chronic fibrosis or acute edema/pneumonitis. Emphysematous changes. No acute consolidation. Electronically Signed   By: Burman Nieves M.D.   On: 01/18/2015 20:39   Ct Angio Chest Pe W/cm &/or Wo Cm  01/19/2015  CLINICAL DATA:  Left-sided chest  pain, headache, and blurred vision. Nausea and abdominal pain. EXAM: CT ANGIOGRAPHY CHEST WITH CONTRAST TECHNIQUE: Multidetector CT imaging of the chest was performed using the standard protocol during bolus administration of intravenous contrast. Multiplanar CT image reconstructions and MIPs were obtained to evaluate the vascular anatomy. CONTRAST:  OMNIPAQUE IOHEXOL 350 MG/ML SOLN COMPARISON:  06/24/2013 FINDINGS: Technically adequate study with good opacification of the central and segmental pulmonary arteries. No focal filling defects demonstrated. No evidence of significant pulmonary embolus. Normal heart size. Normal caliber thoracic aorta. No aortic dissection. Great vessel origins are patent. Enlarged lymph nodes demonstrated throughout the mediastinum and in both hila. Largest right paratracheal lymph node measures about 1.8 cm short axis dimension. Subcarinal lymph nodes measure up to 2.2 cm short axis dimension. Scattered calcifications are demonstrated throughout the lymph nodes. This suggest probable postinflammatory change. Lymphadenopathy was present on the previous study is well. Esophagus is decompressed. Calcified granulomas in the lungs. Fine reticular nodular pattern to the lungs is new since previous study. This could indicate interstitial edema or interstitial pneumonitis. Given presence of calcifications, consider possible reactivation miliary TB. No focal consolidation. No pleural effusions. No pneumothorax. Included portions of the upper abdominal organs are grossly unremarkable. Degenerative changes in the spine. No destructive bone lesions. Review of the MIP images confirms the above findings. IMPRESSION: Enlarged calcified lymph nodes in the hila and mediastinum consistent with postinflammatory etiology. Calcified granulomas in the lungs. Knee reticular nodular interstitial changes suggest interstitial edema or interstitial pneumonia. Consider possible reactivation TB. No evidence  of significant pulmonary embolus. Electronically Signed   By: Burman Nieves M.D.   On: 01/19/2015 01:13    EKG:   Orders placed or performed during the hospital encounter of 01/18/15  . EKG 12-Lead  . EKG 12-Lead    IMPRESSION AND PLAN:   49 year old Caucasian gentleman history of essential hypertension presenting with chest pain as well as shortness of breath  1. Chest pain, left: Aspirin statin therapy telemetry cardiac enzymes 3 2. Community acquired pneumonia: Mild case started on Levaquin emergency department continue antibiotic coverage 3. Hyperlipidemia unspecified statin therapy 4. Essential hypertension: Metoprolol, Cardizem 5. Venous thromboembolic prophylactic: Heparin subcutaneous    All the records are reviewed and case discussed with ED provider. Management plans discussed with the patient, family and they are in agreement.  CODE STATUS: Full  TOTAL TIME TAKING CARE OF THIS PATIENT: 35 minutes.    Quincy Boy,  Mardi Mainland.D on 01/19/2015 at 2:14 AM  Between 7am to 6pm - Pager - [phone]  After 6pm: House Pager: - [phone]  [facility]  Office  [phone]  CC: Primary care physician; Lanier Ensign KEY, MD

## 2015-01-19 NOTE — Progress Notes (Signed)
Dr. Luberta MutterKonidena is aware that stress test is scheduled for the am due to patient not being NPO. Notified MD of BP 100/64, per MD d/c cardizem. Will continue to monitor patient. Rudean Haskellonnisha R Caree Wolpert

## 2015-01-19 NOTE — ED Notes (Signed)
Iv started   meds given.  Breathing treatments given.

## 2015-01-19 NOTE — Progress Notes (Signed)
   01/19/15 1400  Clinical Encounter Type  Visited With Patient  Visit Type Follow-up  Referral From Nurse  Consult/Referral To Chaplain  Chaplain assisted patient with the completion of his Advanced Directive. Patient was advised that his wishes to be a DNR would have to go through the doctor. Chaplain encouraged patient to have a talk with his doctor prior to leaving to have the order put in place.   Chaplain Wataru Mccowen [id number]

## 2015-01-20 LAB — NM MYOCAR MULTI W/SPECT W/WALL MOTION / EF
CSEPPHR: 106 {beats}/min
LVDIAVOL: 82 mL
LVSYSVOL: 28 mL
Rest HR: 73 {beats}/min
SDS: 2
SRS: 5
SSS: 0

## 2015-01-20 MED ORDER — TECHNETIUM TC 99M SESTAMIBI - CARDIOLITE
13.4230 | Freq: Once | INTRAVENOUS | Status: AC | PRN
  Administered 2015-01-20: 13.423 via INTRAVENOUS

## 2015-01-20 MED ORDER — BENZONATATE 200 MG PO CAPS: 200.0000 mg | ORAL_CAPSULE | Freq: Three times a day (TID) | ORAL | Status: DC | PRN

## 2015-01-20 MED ORDER — REGADENOSON 0.4 MG/5ML IV SOLN
0.4000 mg | Freq: Once | INTRAVENOUS | Status: AC
  Administered 2015-01-20: 0.4 mg via INTRAVENOUS
  Filled 2015-01-20: qty 5

## 2015-01-20 MED ORDER — TECHNETIUM TC 99M SESTAMIBI - CARDIOLITE
30.7620 | Freq: Once | INTRAVENOUS | Status: AC | PRN
  Administered 2015-01-20: 09:00:00 30.762 via INTRAVENOUS

## 2015-01-20 NOTE — Progress Notes (Signed)
Pt discharged to home, pt given 1 500 mg levaquin because his pharmacy is closed until Monday, his meds stored in pharmacy were returned to him, [facility] bleeding [facility], tele turned in, pt will leave hospital on his scooter

## 2015-01-20 NOTE — Progress Notes (Signed)
[facility]

## 2015-01-20 NOTE — Discharge Summary (Signed)
[facility] - [place] at [facility]  DISCHARGE SUMMARY   PATIENT NAME: [name]    MR#:  [id number]  DATE OF BIRTH:  23-Jul-1965  DATE OF ADMISSION:  01/18/2015 ADMITTING PHYSICIAN: Wyatt Haste, MD  DATE OF DISCHARGE: 01/21/2015  PRIMARY CARE PHYSICIAN: SOLES, MEREDITH KEY, MD    ADMISSION DIAGNOSIS:  cp anxiety  DISCHARGE DIAGNOSIS:  Principal Problem:   Left sided chest pain   SECONDARY DIAGNOSIS:   Past Medical History  Diagnosis Date  . Polysubstance abuse   . Bipolar disorder (HCC)   . Esophageal stricture   . H/O tonic-clonic seizures   . Anxiety   . GERD (gastroesophageal reflux disease)   . Chronic pain   . Asthma   . Hypertension     HOSPITAL COURSE:   #1 left-sided chest pain: Likely due to anxiety. Has been monitored on telemetry with no events. No EKG changes. Troponins have been negative 3. Head stress Myoview on day of discharge which was very low probability. Head CT angiography showing only old granulomatous disease no PE edema or pneumonia. He will follow-up with his primary care provider  #2 anxiety depression: Continue Klonopin and Wellbutrin  #3 hypertension: Blood pressure has been well controlled during hospitalization. No changes to home regimen  #4 questionable granulomatous disease on CT of the chest. These appear to be old calcifications. Question of TB by radiology however patient exhibits no symptoms. Specifically no fevers chills night sweats cough hemoptysis weight loss. Prior to discharge, QantiferonGold was obtained and this will be followed by me. I believe he has. Low overall risk of TB.   DISCHARGE CONDITIONS:   Stable  CONSULTS OBTAINED:    None  DRUG ALLERGIES:  No Known Allergies  DISCHARGE MEDICATIONS:   Current Discharge Medication List    START taking these medications   Details  levofloxacin (LEVAQUIN) 500 MG tablet Take 1 tablet (500 mg total) by mouth daily. Qty: 3 tablet,  Refills: 0    metaxalone (SKELAXIN) 800 MG tablet Take 1 tablet (800 mg total) by mouth 3 (three) times daily. Qty: 15 tablet, Refills: 0      CONTINUE these medications which have NOT CHANGED   Details  atorvastatin (LIPITOR) 10 MG tablet Take 10 mg by mouth every evening.    busPIRone (BUSPAR) 15 MG tablet Take 1 tablet (15 mg total) by mouth 3 (three) times daily. Qty: 90 tablet, Refills: 0    clonazePAM (KLONOPIN) 0.5 MG tablet Take 1 tablet (0.5 mg total) by mouth 3 (three) times daily as needed. Qty: 30 tablet, Refills: 0    diltiazem (CARDIZEM) 30 MG tablet Take 1 tablet (30 mg total) by mouth 4 (four) times daily. Qty: 120 tablet, Refills: 0    FLUoxetine (PROZAC) 20 MG capsule Take 20 mg by mouth 2 (two) times daily.    fluticasone (FLONASE) 50 MCG/ACT nasal spray Place 2 sprays into both nostrils daily.    gabapentin (NEURONTIN) 300 MG capsule Take 1 capsule (300 mg total) by mouth 2 (two) times daily. Qty: 60 capsule, Refills: 0    HYDROcodone-acetaminophen (NORCO/VICODIN) 5-325 MG tablet Take 1-2 tablets by mouth every 4 (four) hours as needed for moderate pain.    loratadine (CLARITIN) 10 MG tablet Take 10 mg by mouth daily.    metoprolol succinate (TOPROL-XL) 50 MG 24 hr tablet Take 50 mg by mouth daily.    pantoprazole (PROTONIX) 40 MG tablet Take 1 tablet (40 mg total) by mouth daily. Qty: 30  tablet, Refills: 0    venlafaxine XR (EFFEXOR-XR) 150 MG 24 hr capsule Take 1 capsule (150 mg total) by mouth daily with breakfast. Qty: 30 capsule, Refills: 0         DISCHARGE INSTRUCTIONS:    Stable condition. Heart healthy diet. No home health needs other than the when necessary oxygen he had been using prior to this admission.  If you experience worsening of your admission symptoms, develop shortness of breath, life threatening emergency, suicidal or homicidal thoughts you must seek medical attention immediately by calling 911 or calling your MD immediately  if  symptoms less severe.  You Must read complete instructions/literature along with all the possible adverse reactions/side effects for all the Medicines you take and that have been prescribed to you. Take any new Medicines after you have completely understood and accept all the possible adverse reactions/side effects.   Please note  You were cared for by a hospitalist during your hospital stay. If you have any questions about your discharge medications or the care you received while you were in the hospital after you are discharged, you can call the unit and asked to speak with the hospitalist on call if the hospitalist that took care of you is not available. Once you are discharged, your primary care physician will handle any further medical issues. Please note that NO REFILLS for any discharge medications will be authorized once you are discharged, as it is imperative that you return to your primary care physician (or establish a relationship with a primary care physician if you do not have one) for your aftercare needs so that they can reassess your need for medications and monitor your lab values.    Today   CHIEF COMPLAINT:   Chief Complaint  Patient presents with  . Panic Attack  . Chest Pain    HISTORY OF PRESENT ILLNESS:  [name] is a 49 y.o. male with a known history of hyperlipidemia unspecified, essential hypertension presenting with chest pain.Describes acute onset chest pain which occurred at rest left chest in location, squeezing in quality, 7/10 in intensity no worsening or relieving factors nonradiating. He also describes having shortness of breath mainly dyspnea on exertion with associated nonproductive cough. This is present for maybe 3-4 days, positive subjective chills denies fevers.  VITAL SIGNS:  Blood pressure 118/67, pulse 73, temperature 97.8 F (36.6 C), temperature source Oral, resp. rate 18, height 5\' 7"  (1.702 m), weight 84.505 kg (186 lb 4.8 oz), SpO2 100  %.  I/O:   Intake/Output Summary (Last 24 hours) at 01/20/15 1443 Last data filed at 01/20/15 1130  Gross per 24 hour  Intake    483 ml  Output   1900 ml  Net  -1417 ml    PHYSICAL EXAMINATION:  GENERAL:  49 y.o.-year-old patient lying in the bed with no acute distress.  EYES: Pupils equal, round, reactive to light and accommodation. No scleral icterus. Extraocular muscles intact.  HEENT: Head atraumatic, normocephalic. Oropharynx and nasopharynx clear.  NECK:  Supple, no jugular venous distention. No thyroid enlargement, no tenderness.  LUNGS: Normal breath sounds bilaterally, no wheezing, rales,rhonchi or crepitation. No use of accessory muscles of respiration.  CARDIOVASCULAR: S1, S2 normal. No murmurs, rubs, or gallops.  ABDOMEN: Soft, non-tender, non-distended. Bowel sounds present. No organomegaly or mass.  EXTREMITIES: No pedal edema, cyanosis, or clubbing.  NEUROLOGIC: Cranial nerves II through XII are intact. Muscle strength 5/5 in all extremities. Sensation intact. Gait not checked.  PSYCHIATRIC: The patient is  alert and oriented x 3.  SKIN: No obvious rash, lesion, or ulcer.   DATA REVIEW:   CBC  Recent Labs Lab 01/18/15 1957  WBC 7.8  HGB 14.1  HCT 42.4  PLT 165    Chemistries   Recent Labs Lab 01/18/15 1957  NA 142  K 3.8  CL 103  CO2 33*  GLUCOSE 107*  BUN 11  CREATININE 1.13  CALCIUM 9.0  AST 17  ALT 14*  ALKPHOS 60  BILITOT 0.3    Cardiac Enzymes  Recent Labs Lab 01/19/15 1704  TROPONINI <0.03    Microbiology Results  Results for orders placed or performed in visit on 06/03/13  Culture, blood (single)     Status: None   Collection Time: 06/12/13  9:58 AM  Result Value Ref Range Status   Micro Text Report   Final       COMMENT                   NO GROWTH AEROBICALLY/ANAEROBICALLY IN 5 DAYS   ANTIBIOTIC                                                      Culture, blood (single)     Status: None   Collection Time: 06/12/13  10:02 AM  Result Value Ref Range Status   Micro Text Report   Final       SOURCE: line draw    COMMENT                   NO GROWTH AEROBICALLY/ANAEROBICALLY IN 5 DAYS   ANTIBIOTIC                                                      Urine culture     Status: None   Collection Time: 06/12/13 10:15 AM  Result Value Ref Range Status   Micro Text Report   Final       SOURCE: INDWELLING CATHETER    COMMENT                   NO GROWTH IN 18-24 HOURS   ANTIBIOTIC                                                      Culture, expectorated sputum-assessment     Status: None   Collection Time: 06/12/13 11:15 AM  Result Value Ref Range Status   Micro Text Report   Final       SOURCE: ETS    ORGANISM 1                MODERATE GROWTH CANDIDA ALBICANS   COMMENT                   -   GRAM STAIN                GOOD SPECIMEN-80-90% WBC   GRAM STAIN  MODERATE WHITE BLOOD CELLS   GRAM STAIN                FEW YEAST   GRAM STAIN                RARE GRAM POSITIVE COCCI   ANTIBIOTIC                    ORG#1                                               Culture, blood (single)     Status: None   Collection Time: 06/24/13  4:30 AM  Result Value Ref Range Status   Micro Text Report   Final       ORGANISM 1                CANDIDA ALBICANS   COMMENT                   IN AEROBIC BOTTLE ONLY   GRAM STAIN                YEAST   ANTIBIOTIC                    ORG#1                                               Culture, blood (single)     Status: None   Collection Time: 06/24/13  4:41 AM  Result Value Ref Range Status   Micro Text Report   Final       SOURCE: line draw    ORGANISM 1                CANDIDA ALBICANS   COMMENT                   IN AEROBIC BOTTLE ONLY   GRAM STAIN                YEAST   ANTIBIOTIC                    ORG#1                                               Urine culture     Status: None   Collection Time: 06/24/13  5:50 PM  Result Value Ref  Range Status   Micro Text Report   Final       SOURCE: INDWELLING CATH    ORGANISM 1                >100,000 CFU/ML CANDIDA GLABRATA   COMMENT                   -   ANTIBIOTIC                    ORG#1  Culture, blood (single)     Status: None   Collection Time: 06/28/13  9:55 PM  Result Value Ref Range Status   Micro Text Report   Final       ORGANISM 1                CANDIDA ALBICANS   COMMENT                   IN AEROBIC BOTTLE ONLY   COMMENT                   -   GRAM STAIN                YEAST   ANTIBIOTIC                    ORG#1                                               Culture, blood (single)     Status: None   Collection Time: 06/28/13 10:03 PM  Result Value Ref Range Status   Micro Text Report   Final       COMMENT                   NO GROWTH AEROBICALLY/ANAEROBICALLY IN 5 DAYS   ANTIBIOTIC                                                      Culture, expectorated sputum-assessment     Status: None   Collection Time: 07/01/13  1:30 PM  Result Value Ref Range Status   Micro Text Report   Final       SOURCE: ETS    ORGANISM 1                LIGHT GROWTH CANDIDA ALBICANS   COMMENT                   -   GRAM STAIN                GOOD SPECIMEN-80-90% WBC   GRAM STAIN                MANY WHITE BLOOD CELLS   GRAM STAIN                FEW GRAM POSITIVE COCCI IN PAIRS   GRAM STAIN                RARE YEAST   ANTIBIOTIC                    ORG#1                                               Clostridium Difficile [facility])     Status: None   Collection Time: 07/02/13  3:40 PM  Result Value Ref Range Status   Micro Text Report   Final       C.DIFFICILE ANTIGEN       C.DIFFICILE  GDH ANTIGEN : NEGATIVE   C.DIFFICILE TOXIN A/B     C.DIFFICILE TOXINS A AND B : NEGATIVE   INTERPRETATION            Negative for C. difficile.    ANTIBIOTIC                                                      Culture, blood (single)      Status: None   Collection Time: 07/04/13  9:47 AM  Result Value Ref Range Status   Micro Text Report   Final       COMMENT                   NO GROWTH AEROBICALLY/ANAEROBICALLY IN 5 DAYS   ANTIBIOTIC                                                      Culture, blood (single)     Status: None   Collection Time: 07/04/13 11:46 AM  Result Value Ref Range Status   Micro Text Report   Final       SOURCE: r-arm    COMMENT                   NO GROWTH AEROBICALLY/ANAEROBICALLY IN 5 DAYS   ANTIBIOTIC                                                      Culture, blood (single)     Status: None   Collection Time: 07/09/13  9:58 AM  Result Value Ref Range Status   Micro Text Report   Final       COMMENT                   NO GROWTH AEROBICALLY/ANAEROBICALLY IN 5 DAYS   ANTIBIOTIC                                                      Culture, blood (single)     Status: None   Collection Time: 07/09/13 10:02 AM  Result Value Ref Range Status   Micro Text Report   Final       COMMENT                   NO GROWTH AEROBICALLY/ANAEROBICALLY IN 5 DAYS   ANTIBIOTIC                                                      Clostridium Difficile [facility])     Status: None   Collection Time: 07/12/13  4:02 PM  Result Value Ref Range Status   Micro Text Report  Final       C.DIFFICILE ANTIGEN       C.DIFFICILE GDH ANTIGEN : NEGATIVE   C.DIFFICILE TOXIN A/B     C.DIFFICILE TOXINS A AND B : NEGATIVE   INTERPRETATION            Negative for C. difficile.    ANTIBIOTIC                                                      Urine culture     Status: None   Collection Time: 07/12/13  4:02 PM  Result Value Ref Range Status   Micro Text Report   Final       SOURCE: INDWELLING CATHETER    COMMENT                   NO GROWTH IN 36 HOURS   ANTIBIOTIC                                                      Culture, blood (single)     Status: None   Collection Time: 07/13/13 11:34 AM  Result Value  Ref Range Status   Micro Text Report   Final       SOURCE: left ac    COMMENT                   NO GROWTH AEROBICALLY/ANAEROBICALLY IN 5 DAYS   ANTIBIOTIC                                                      Culture, blood (single)     Status: None   Collection Time: 07/13/13 11:48 AM  Result Value Ref Range Status   Micro Text Report   Final       SOURCE: left ac    COMMENT                   NO GROWTH AEROBICALLY/ANAEROBICALLY IN 5 DAYS   ANTIBIOTIC                                                      Culture, blood (single)     Status: None   Collection Time: 07/21/13  9:06 AM  Result Value Ref Range Status   Micro Text Report   Final       COMMENT                   NO GROWTH AEROBICALLY/ANAEROBICALLY IN 5 DAYS   ANTIBIOTIC                                                      Culture, blood (  single)     Status: None   Collection Time: 07/21/13  9:14 AM  Result Value Ref Range Status   Micro Text Report   Final       ORGANISM 1                STAPHYLOCOCCUS EPIDERMIDIS   COMMENT                   IN AEROBIC AND ANAEROBIC BOTTLES   COMMENT                   -   GRAM STAIN                GRAM POSITIVE COCCI IN CLUSTERS   ANTIBIOTIC                    ORG#1    ORG#2     CIPROFLOXACIN                 R                  CLINDAMYCIN                   R                  ERYTHROMYCIN                  R                  GENTAMICIN                    R                  LEVOFLOXACIN                  R                  LINEZOLID                     S                  OXACILLIN                     R                  VANCOMYCIN                    S                    Culture, expectorated sputum-assessment     Status: None   Collection Time: 07/21/13 11:20 AM  Result Value Ref Range Status   Micro Text Report   Final       SOURCE: ETS    ORGANISM 1                HEAVY GROWTH METHICILLIN RESISTANT STAPH.AUREUS   GRAM STAIN                GOOD SPECIMEN-80-90% WBC   GRAM  STAIN                MANY WHITE BLOOD CELLS   GRAM STAIN                MANY GRAM POSITIVE COCCI IN CLUSTERS   ANTIBIOTIC  ORG#1     CIPROFLOXACIN                 R         CLINDAMYCIN                   R         ERYTHROMYCIN                  R         GENTAMICIN                    S         LEVOFLOXACIN                  I         LINEZOLID                     S         OXACILLIN                     R         TIGECYCLINE                   S         VANCOMYCIN                    S         CEFOXITIN SCREEN              POSITIVE  INDUCIBLE CLINDAMYCIN RESISTANNEGATIVE  TRIMETHOPRIM/SULFAMETHOXAZOLE S           Clostridium Difficile [facility])     Status: None   Collection Time: 07/22/13  9:02 AM  Result Value Ref Range Status   Micro Text Report   Final       C.DIFFICILE ANTIGEN       UNABLE TO OBTAIN A VALID ANTIGEN RESULT.   C.DIFFICILE TOXIN A/B     UNABLE TO OBTAIN A VALID TOXIN RESULT.   PCR FOR TOXIGENIC C.DIFF  PCR FOR TOXIGENIC C.DIFFICILE : POSITIVE   INTERPRETATION            Positive for toxigenic C. difficile, active toxin production present.   ANTIBIOTIC                                                        RADIOLOGY:  Dg Chest 2 View  01/18/2015  CLINICAL DATA:  Chest pain and anxiety attacks. Shortness of breath and cough. EXAM: CHEST  2 VIEW COMPARISON:  01/01/2015 FINDINGS: Normal heart size and pulmonary vascularity. Diffuse interstitial pattern in the lungs may represent chronic fibrosis or acute interstitial edema or acute interstitial pneumonitis. Appearance is similar to previous study. No developing consolidation or airspace disease. No blunting of costophrenic angles. No pneumothorax. Mild hyperinflation suggesting emphysema. Degenerative changes in the spine. Mediastinal contours appear intact. Calcified granulomas in the upper lungs. IMPRESSION: Nonspecific interstitial pattern to the lungs may indicate chronic fibrosis or acute  edema/pneumonitis. Emphysematous changes. No acute consolidation. Electronically Signed   By: Burman Nieves M.D.   On: 01/18/2015 20:39   Ct Angio Chest Pe W/cm &/or Wo Cm  01/19/2015  CLINICAL DATA:  Left-sided chest pain,  headache, and blurred vision. Nausea and abdominal pain. EXAM: CT ANGIOGRAPHY CHEST WITH CONTRAST TECHNIQUE: Multidetector CT imaging of the chest was performed using the standard protocol during bolus administration of intravenous contrast. Multiplanar CT image reconstructions and MIPs were obtained to evaluate the vascular anatomy. CONTRAST:  OMNIPAQUE IOHEXOL 350 MG/ML SOLN COMPARISON:  06/24/2013 FINDINGS: Technically adequate study with good opacification of the central and segmental pulmonary arteries. No focal filling defects demonstrated. No evidence of significant pulmonary embolus. Normal heart size. Normal caliber thoracic aorta. No aortic dissection. Great vessel origins are patent. Enlarged lymph nodes demonstrated throughout the mediastinum and in both hila. Largest right paratracheal lymph node measures about 1.8 cm short axis dimension. Subcarinal lymph nodes measure up to 2.2 cm short axis dimension. Scattered calcifications are demonstrated throughout the lymph nodes. This suggest probable postinflammatory change. Lymphadenopathy was present on the previous study is well. Esophagus is decompressed. Calcified granulomas in the lungs. Fine reticular nodular pattern to the lungs is new since previous study. This could indicate interstitial edema or interstitial pneumonitis. Given presence of calcifications, consider possible reactivation miliary TB. No focal consolidation. No pleural effusions. No pneumothorax. Included portions of the upper abdominal organs are grossly unremarkable. Degenerative changes in the spine. No destructive bone lesions. Review of the MIP images confirms the above findings. IMPRESSION: Enlarged calcified lymph nodes in the hila and mediastinum  consistent with postinflammatory etiology. Calcified granulomas in the lungs. Knee reticular nodular interstitial changes suggest interstitial edema or interstitial pneumonia. Consider possible reactivation TB. No evidence of significant pulmonary embolus. Electronically Signed   By: Burman Nieves M.D.   On: 01/19/2015 01:13   Nm Myocar Multi W/spect W/wall Motion / Ef  01/20/2015   There was no ST segment deviation noted during stress.  The study is normal.  This is a low risk study.  The left ventricular ejection fraction is normal (55-65%).  Normal sestamibi. Normal ef with no evidence of ischemia    EKG:   Orders placed or performed during the hospital encounter of 01/18/15  . EKG 12-Lead  . EKG 12-Lead      Management plans discussed with the patient, family and they are in agreement.  CODE STATUS:     Code Status Orders        Start     Ordered   01/19/15 0153  Full code   Continuous     01/19/15 0152      TOTAL TIME TAKING CARE OF THIS PATIENT: 35 minutes.  Greater than 50% of time spent in care coordination and counseling.  Elby Showers M.D on 01/20/2015 at 2:43 PM  Between 7am to 6pm - Pager - [phone]  After 6pm go to [email or web address] - password [email or web address] [facility]  [place]  Office  [phone]  CC: Primary care physician; Lanier Ensign KEY, MD

## 2015-01-20 NOTE — Discharge Instructions (Signed)
°  DIET:  Cardiac diet  DISCHARGE CONDITION:  Fair  ACTIVITY:  Activity as tolerated  OXYGEN:  Home Oxygen: yes, 2L as needed, prescribed prior to admission   Oxygen Delivery: nasal canula  DISCHARGE LOCATION:  home   If you experience worsening of your admission symptoms, develop shortness of breath, life threatening emergency, suicidal or homicidal thoughts you must seek medical attention immediately by calling 911 or calling your MD immediately  if symptoms less severe.  You Must read complete instructions/literature along with all the possible adverse reactions/side effects for all the Medicines you take and that have been prescribed to you. Take any new Medicines after you have completely understood and accpet all the possible adverse reactions/side effects.   Please note  You were cared for by a hospitalist during your hospital stay. If you have any questions about your discharge medications or the care you received while you were in the hospital after you are discharged, you can call the unit and asked to speak with the hospitalist on call if the hospitalist that took care of you is not available. Once you are discharged, your primary care physician will handle any further medical issues. Please note that NO REFILLS for any discharge medications will be authorized once you are discharged, as it is imperative that you return to your primary care physician (or establish a relationship with a primary care physician if you do not have one) for your aftercare needs so that they can reassess your need for medications and monitor your lab values.

## 2015-01-30 ENCOUNTER — Inpatient Hospital Stay
Admission: EM | Admit: 2015-01-30 | Discharge: 2015-02-09 | DRG: 190 | Disposition: A | Discharge status: Home / Self Care | Payer: Medicaid Other | Attending: Internal Medicine | Admitting: Internal Medicine

## 2015-01-30 ENCOUNTER — Emergency Department: Payer: Medicaid Other

## 2015-01-30 DIAGNOSIS — R109 Unspecified abdominal pain: Secondary | ICD-10-CM

## 2015-01-30 DIAGNOSIS — R000 Tachycardia, unspecified: Secondary | ICD-10-CM | POA: Diagnosis present

## 2015-01-30 DIAGNOSIS — Z6829 Body mass index (BMI) 29.0-29.9, adult: Secondary | ICD-10-CM

## 2015-01-30 DIAGNOSIS — Y92239 Unspecified place in hospital as the place of occurrence of the external cause: Secondary | ICD-10-CM | POA: Diagnosis not present

## 2015-01-30 DIAGNOSIS — T380X5A Adverse effect of glucocorticoids and synthetic analogues, initial encounter: Secondary | ICD-10-CM | POA: Diagnosis not present

## 2015-01-30 DIAGNOSIS — Z789 Other specified health status: Secondary | ICD-10-CM

## 2015-01-30 DIAGNOSIS — E669 Obesity, unspecified: Secondary | ICD-10-CM | POA: Diagnosis present

## 2015-01-30 DIAGNOSIS — J441 Chronic obstructive pulmonary disease with (acute) exacerbation: Principal | ICD-10-CM | POA: Diagnosis present

## 2015-01-30 DIAGNOSIS — G8929 Other chronic pain: Secondary | ICD-10-CM | POA: Diagnosis present

## 2015-01-30 DIAGNOSIS — B349 Viral infection, unspecified: Secondary | ICD-10-CM | POA: Diagnosis not present

## 2015-01-30 DIAGNOSIS — R059 Cough, unspecified: Secondary | ICD-10-CM

## 2015-01-30 DIAGNOSIS — F419 Anxiety disorder, unspecified: Secondary | ICD-10-CM | POA: Diagnosis present

## 2015-01-30 DIAGNOSIS — K5790 Diverticulosis of intestine, part unspecified, without perforation or abscess without bleeding: Secondary | ICD-10-CM | POA: Diagnosis present

## 2015-01-30 DIAGNOSIS — F17210 Nicotine dependence, cigarettes, uncomplicated: Secondary | ICD-10-CM | POA: Diagnosis present

## 2015-01-30 DIAGNOSIS — K219 Gastro-esophageal reflux disease without esophagitis: Secondary | ICD-10-CM | POA: Diagnosis present

## 2015-01-30 DIAGNOSIS — J189 Pneumonia, unspecified organism: Secondary | ICD-10-CM | POA: Diagnosis present

## 2015-01-30 DIAGNOSIS — F319 Bipolar disorder, unspecified: Secondary | ICD-10-CM | POA: Diagnosis present

## 2015-01-30 DIAGNOSIS — R05 Cough: Secondary | ICD-10-CM

## 2015-01-30 DIAGNOSIS — I10 Essential (primary) hypertension: Secondary | ICD-10-CM | POA: Diagnosis present

## 2015-01-30 DIAGNOSIS — Z66 Do not resuscitate: Secondary | ICD-10-CM | POA: Diagnosis present

## 2015-01-30 DIAGNOSIS — R0602 Shortness of breath: Secondary | ICD-10-CM

## 2015-01-30 DIAGNOSIS — J45909 Unspecified asthma, uncomplicated: Secondary | ICD-10-CM | POA: Diagnosis present

## 2015-01-30 DIAGNOSIS — H538 Other visual disturbances: Secondary | ICD-10-CM

## 2015-01-30 DIAGNOSIS — Z7982 Long term (current) use of aspirin: Secondary | ICD-10-CM

## 2015-01-30 DIAGNOSIS — I499 Cardiac arrhythmia, unspecified: Secondary | ICD-10-CM | POA: Diagnosis present

## 2015-01-30 DIAGNOSIS — R0902 Hypoxemia: Secondary | ICD-10-CM

## 2015-01-30 DIAGNOSIS — J159 Unspecified bacterial pneumonia: Secondary | ICD-10-CM | POA: Diagnosis present

## 2015-01-30 DIAGNOSIS — Z79899 Other long term (current) drug therapy: Secondary | ICD-10-CM

## 2015-01-30 DIAGNOSIS — Z79891 Long term (current) use of opiate analgesic: Secondary | ICD-10-CM

## 2015-01-30 DIAGNOSIS — R296 Repeated falls: Secondary | ICD-10-CM | POA: Diagnosis present

## 2015-01-30 DIAGNOSIS — R51 Headache: Secondary | ICD-10-CM

## 2015-01-30 DIAGNOSIS — F1290 Cannabis use, unspecified, uncomplicated: Secondary | ICD-10-CM | POA: Diagnosis present

## 2015-01-30 DIAGNOSIS — R079 Chest pain, unspecified: Secondary | ICD-10-CM | POA: Diagnosis present

## 2015-01-30 DIAGNOSIS — I959 Hypotension, unspecified: Secondary | ICD-10-CM | POA: Diagnosis present

## 2015-01-30 DIAGNOSIS — R519 Headache, unspecified: Secondary | ICD-10-CM

## 2015-01-30 LAB — CBC WITH DIFFERENTIAL/PLATELET
Basophils Absolute: 0.1 10*3/uL (ref 0–0.1)
Basophils Relative: 1 %
EOS ABS: 0.2 10*3/uL (ref 0–0.7)
EOS PCT: 2 %
HCT: 41.8 % (ref 40.0–52.0)
Hemoglobin: 14 g/dL (ref 13.0–18.0)
LYMPHS ABS: 2.6 10*3/uL (ref 1.0–3.6)
Lymphocytes Relative: 26 %
MCH: 31.1 pg (ref 26.0–34.0)
MCHC: 33.4 g/dL (ref 32.0–36.0)
MCV: 93 fL (ref 80.0–100.0)
MONOS PCT: 6 %
Monocytes Absolute: 0.6 10*3/uL (ref 0.2–1.0)
Neutro Abs: 6.7 10*3/uL — ABNORMAL HIGH (ref 1.4–6.5)
Neutrophils Relative %: 65 %
PLATELETS: 133 10*3/uL — AB (ref 150–440)
RBC: 4.5 MIL/uL (ref 4.40–5.90)
RDW: 15.5 % — ABNORMAL HIGH (ref 11.5–14.5)
WBC: 10.3 10*3/uL (ref 3.8–10.6)

## 2015-01-30 LAB — TROPONIN I

## 2015-01-30 LAB — COMPREHENSIVE METABOLIC PANEL
ALK PHOS: 80 U/L (ref 38–126)
ALT: 14 U/L — AB (ref 17–63)
AST: 18 U/L (ref 15–41)
Albumin: 3.4 g/dL — ABNORMAL LOW (ref 3.5–5.0)
Anion gap: 7 (ref 5–15)
BILIRUBIN TOTAL: 0.3 mg/dL (ref 0.3–1.2)
BUN: 11 mg/dL (ref 6–20)
CALCIUM: 9 mg/dL (ref 8.9–10.3)
CO2: 30 mmol/L (ref 22–32)
CREATININE: 1.06 mg/dL (ref 0.61–1.24)
Chloride: 104 mmol/L (ref 101–111)
Glucose, Bld: 158 mg/dL — ABNORMAL HIGH (ref 65–99)
Potassium: 3.8 mmol/L (ref 3.5–5.1)
Sodium: 141 mmol/L (ref 135–145)
Total Protein: 7.2 g/dL (ref 6.5–8.1)

## 2015-01-30 LAB — LACTIC ACID, PLASMA: Lactic Acid, Venous: 2 mmol/L (ref 0.5–2.0)

## 2015-01-30 LAB — LIPASE, BLOOD: LIPASE: 29 U/L (ref 11–51)

## 2015-01-30 MED ORDER — ONDANSETRON HCL 4 MG/2ML IJ SOLN
4.0000 mg | Freq: Once | INTRAMUSCULAR | Status: AC
  Administered 2015-01-30: 4 mg via INTRAVENOUS
  Filled 2015-01-30: qty 2

## 2015-01-30 MED ORDER — IOHEXOL 240 MG/ML SOLN
25.0000 mL | Freq: Once | INTRAMUSCULAR | Status: AC | PRN
  Administered 2015-01-30: 25 mL via ORAL

## 2015-01-30 MED ORDER — MORPHINE SULFATE (PF) 4 MG/ML IV SOLN
4.0000 mg | Freq: Once | INTRAVENOUS | Status: AC
  Administered 2015-01-30: 4 mg via INTRAVENOUS
  Filled 2015-01-30: qty 1

## 2015-01-30 MED ORDER — FLUCONAZOLE 100 MG PO TABS: 100.0000 mg | ORAL_TABLET | Freq: Once | ORAL | Status: DC

## 2015-01-30 MED ORDER — CLOTRIMAZOLE 1 % EX CREA
TOPICAL_CREAM | Freq: Two times a day (BID) | CUTANEOUS | Status: DC
  Administered 2015-01-31 – 2015-02-09 (×16): via TOPICAL
  Filled 2015-01-30 (×2): qty 15

## 2015-01-30 MED ORDER — IOHEXOL 300 MG/ML  SOLN
100.0000 mL | Freq: Once | INTRAMUSCULAR | Status: AC | PRN
  Administered 2015-01-30: 100 mL via INTRAVENOUS

## 2015-01-30 NOTE — ED Provider Notes (Signed)
[facility] Emergency Department Provider Note  ____________________________________________  Time seen: Approximately 8:03 PM  I have reviewed the triage vital signs and the nursing notes.   HISTORY  Chief Complaint Chest Pain   HPI [name] is a 49 y.o. male patient reports chest pain starting after he ate at 5:00 this evening. Pain is heavy sharp and tight radiates up to the neck and down into the belly and over to the left side of the chest and down the left arm. Moderate to severe in nature and is associated with nausea shortness of breath and some sweating. Patient also reports abdominal pain and on further questioning his abdomen is distended and diffusely tender. Believe this also came on at the same times the chest pain. Patient recently moved into a new retirement home and he is very anxious. Patient says he gets short of breath with exertion. He is not sure if the pain is worse with exertion.   Past Medical History  Diagnosis Date  . Polysubstance abuse   . Bipolar disorder (HCC)   . Esophageal stricture   . H/O tonic-clonic seizures   . Anxiety   . GERD (gastroesophageal reflux disease)   . Chronic pain   . Asthma   . Hypertension     Patient Active Problem List   Diagnosis Date Noted  . Left sided chest pain 01/19/2015  . Adjustment disorder with depressed mood 01/15/2015  . Dyslipidemia 01/03/2015  . Tobacco use disorder 01/03/2015  . Major depressive disorder, recurrent severe without psychotic features (HCC) 01/02/2015  . Hypertension 01/02/2015  . Gastric reflux 01/02/2015    Past Surgical History  Procedure Laterality Date  . Appendectomy      complicated by perforation and abscess  . Peg w/tracheostomy placement    . Peg tube removal    . Tracheostomy closure    . Esophagogastroduodenoscopy (egd) with esophageal dilation    . Hernia repair  Nov 2016    [facility] by Dr. Genene ChurnBunzendahl    Current  Outpatient Rx  Name  Route  Sig  Dispense  Refill  . atorvastatin (LIPITOR) 10 MG tablet   Oral   Take 10 mg by mouth every evening.         . benzonatate (TESSALON) 200 MG capsule   Oral   Take 1 capsule (200 mg total) by mouth 3 (three) times daily as needed for cough.   20 capsule   0   . busPIRone (BUSPAR) 15 MG tablet   Oral   Take 1 tablet (15 mg total) by mouth 3 (three) times daily.   90 tablet   0   . clonazePAM (KLONOPIN) 0.5 MG tablet   Oral   Take 1 tablet (0.5 mg total) by mouth 3 (three) times daily as needed. Patient taking differently: Take 0.5 mg by mouth 3 (three) times daily as needed for anxiety.    30 tablet   0   . diltiazem (CARDIZEM) 30 MG tablet   Oral   Take 1 tablet (30 mg total) by mouth 4 (four) times daily.   120 tablet   0   . FLUoxetine (PROZAC) 20 MG capsule   Oral   Take 20 mg by mouth 2 (two) times daily.         . fluticasone (FLONASE) 50 MCG/ACT nasal spray   Each Nare   Place 2 sprays into both nostrils daily.         .Marland Kitchen  gabapentin (NEURONTIN) 300 MG capsule   Oral   Take 1 capsule (300 mg total) by mouth 2 (two) times daily.   60 capsule   0   . HYDROcodone-acetaminophen (NORCO/VICODIN) 5-325 MG tablet   Oral   Take 1-2 tablets by mouth every 4 (four) hours as needed for moderate pain.         Marland Kitchen levofloxacin (LEVAQUIN) 500 MG tablet   Oral   Take 1 tablet (500 mg total) by mouth daily.   3 tablet   0   . loratadine (CLARITIN) 10 MG tablet   Oral   Take 10 mg by mouth daily.         . metaxalone (SKELAXIN) 800 MG tablet   Oral   Take 1 tablet (800 mg total) by mouth 3 (three) times daily.   15 tablet   0   . metoprolol succinate (TOPROL-XL) 50 MG 24 hr tablet   Oral   Take 50 mg by mouth daily.         . pantoprazole (PROTONIX) 40 MG tablet   Oral   Take 1 tablet (40 mg total) by mouth daily.   30 tablet   0   . venlafaxine XR (EFFEXOR-XR) 150 MG 24 hr capsule   Oral   Take 1 capsule (150 mg  total) by mouth daily with breakfast.   30 capsule   0     Allergies Review of patient's allergies indicates no known allergies.  Family History  Problem Relation Age of Onset  . CAD Other   . Diabetes Other     Social History Social History  Substance Use Topics  . Smoking status: Current Every Day Smoker -- 1.00 packs/day    Types: Cigarettes  . Smokeless tobacco: None  . Alcohol Use: No    Review of Systems Constitutional: No fever/chills Eyes: No visual changes. ENT: No sore throat. Cardiovascular:  chest pain. Respiratory: shortness of breath. Gastrointestinal: abdominal pain.  nausea, no vomiting.  No diarrhea.  No constipation. Genitourinary: Negative for dysuria. Musculoskeletal: Negative for back pain. Skin: Negative for rash. Neurological: Negative for headaches, focal weakness or numbness.  10-point ROS otherwise negative.  ____________________________________________   PHYSICAL EXAM:  VITAL SIGNS: ED Triage Vitals  Enc Vitals Group     BP 01/30/15 1920 107/85 mmHg     Pulse Rate 01/30/15 1920 93     Resp 01/30/15 1920 15     Temp 01/30/15 1920 98.2 F (36.8 C)     Temp src --      SpO2 01/30/15 1920 94 %     Weight 01/30/15 1920 188 lb (85.276 kg)     Height 01/30/15 1920  (1.702 m)     Head Cir --      Peak Flow --      Pain Score 01/30/15 1922 10     Pain Loc --      Pain Edu? --      Excl. in GC? --     Constitutional: Alert and oriented. Well appearing and in no acute distress. Eyes: Conjunctivae are normal. PERRL. EOMI. Head: Atraumatic. Nose: No congestion/rhinnorhea. Mouth/Throat: Mucous membranes are moist.  Oropharynx non-erythematous. Neck: No stridor.  {* Cardiovascular: Normal rate, regular rhythm. Grossly normal heart sounds.  Good peripheral circulation. Respiratory: Normal respiratory effort.  No retractions. Lungs occasional scattered wheezes Gastrointestinal: Soft but distended and diffusely tender to palpation  percussion. Bowel sounds seem to be somewhat decreased. No distention. No abdominal bruits.  No CVA tenderness. Musculoskeletal: No lower extremity tenderness nor edema.  No joint effusions. Neurologic:  Normal speech and language. No gross focal neurologic deficits are appreciated. No gait instability. Skin:  Skin is warm, dry and intact. No rash noted.   ____________________________________________   LABS (all labs ordered are listed, but only abnormal results are displayed)  Labs Reviewed  COMPREHENSIVE METABOLIC PANEL - Abnormal; Notable for the following:    Glucose, Bld 158 (*)    Albumin 3.4 (*)    ALT 14 (*)    All other components within normal limits  CBC WITH DIFFERENTIAL/PLATELET - Abnormal; Notable for the following:    RDW 15.5 (*)    Platelets 133 (*)    Neutro Abs 6.7 (*)    All other components within normal limits  LIPASE, BLOOD  TROPONIN I  LACTIC ACID, PLASMA  LACTIC ACID, PLASMA  URINALYSIS COMPLETEWITH MICROSCOPIC ([facility] ONLY)  TROPONIN I   ____________________________________________  EKG   ____________________________________________  RADIOLOGY Chest x-ray shows bilateral upper lobe infiltrates possible infectious etiology. 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 This does not appear to have been addressed otherwise. We will admit the patient and work this up.  ____________________________________________   PROCEDURES    ____________________________________________   INITIAL IMPRESSION / ASSESSMENT AND PLAN / ED COURSE  Pertinent labs & imaging results that were available during my care of the patient were reviewed by me and considered in my medical decision making (see chart for details).   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

## 2015-01-30 NOTE — ED Notes (Signed)
Patient here with chest pain to left chest. "around my heart". States he is also anxious and has had heartburn for the last few days.

## 2015-01-31 DIAGNOSIS — R079 Chest pain, unspecified: Secondary | ICD-10-CM | POA: Diagnosis present

## 2015-01-31 LAB — TROPONIN I
Troponin I: <0.03 ng/mL (ref <0.031)
Troponin I: <0.03 ng/mL (ref <0.031)
Troponin I: <0.03 ng/mL (ref <0.031)
Troponin I: <0.03 ng/mL (ref <0.031)

## 2015-01-31 LAB — CBC
HCT: 39.8 % — ABNORMAL LOW (ref 40.0–52.0)
Hemoglobin: 13.4 g/dL (ref 13.0–18.0)
MCH: 31.2 pg (ref 26.0–34.0)
MCHC: 33.7 g/dL (ref 32.0–36.0)
MCV: 92.8 fL (ref 80.0–100.0)
Platelets: 118 10*3/uL — ABNORMAL LOW (ref 150–440)
RBC: 4.28 MIL/uL — ABNORMAL LOW (ref 4.40–5.90)
RDW: 15.2 % — ABNORMAL HIGH (ref 11.5–14.5)
WBC: 8.4 10*3/uL (ref 3.8–10.6)

## 2015-01-31 LAB — HEMOGLOBIN A1C: Hgb A1c MFr Bld: 6 % (ref 4.0–6.0)

## 2015-01-31 LAB — RAPID HIV SCREEN (HIV 1/2 AB+AG)
HIV 1/2 ANTIBODIES: NONREACTIVE
HIV-1 P24 Antigen - HIV24: NONREACTIVE

## 2015-01-31 LAB — URINALYSIS COMPLETE WITH MICROSCOPIC (ARMC ONLY)
BILIRUBIN URINE: NEGATIVE
Bacteria, UA: NONE SEEN
GLUCOSE, UA: NEGATIVE mg/dL
Hgb urine dipstick: NEGATIVE
KETONES UR: NEGATIVE mg/dL
LEUKOCYTES UA: NEGATIVE
Nitrite: NEGATIVE
PH: 5 (ref 5.0–8.0)
Protein, ur: NEGATIVE mg/dL
Specific Gravity, Urine: 1.055 — ABNORMAL HIGH (ref 1.005–1.030)
Squamous Epithelial / LPF: NONE SEEN

## 2015-01-31 LAB — MRSA PCR SCREENING: MRSA by PCR: NEGATIVE

## 2015-01-31 LAB — TSH: TSH: 1.531 u[IU]/mL (ref 0.350–4.500)

## 2015-01-31 LAB — LACTIC ACID, PLASMA: LACTIC ACID, VENOUS: 1.3 mmol/L (ref 0.5–2.0)

## 2015-01-31 MED ORDER — LEVOFLOXACIN IN D5W 500 MG/100ML IV SOLN
500.0000 mg | INTRAVENOUS | Status: DC
  Administered 2015-01-31 – 2015-02-01 (×2): 500 mg via INTRAVENOUS
  Filled 2015-01-31 (×3): qty 100

## 2015-01-31 MED ORDER — ATORVASTATIN CALCIUM 10 MG PO TABS
10.0000 mg | ORAL_TABLET | Freq: Every day | ORAL | Status: DC
  Administered 2015-01-31 – 2015-02-08 (×9): 10 mg via ORAL
  Filled 2015-01-31 (×9): qty 1

## 2015-01-31 MED ORDER — IPRATROPIUM-ALBUTEROL 20-100 MCG/ACT IN AERS: 1.0000 | INHALATION_SPRAY | Freq: Four times a day (QID) | RESPIRATORY_TRACT | Status: DC

## 2015-01-31 MED ORDER — SODIUM CHLORIDE 0.9 % IJ SOLN
3.0000 mL | Freq: Two times a day (BID) | INTRAMUSCULAR | Status: DC
  Administered 2015-01-31 – 2015-02-09 (×17): 3 mL via INTRAVENOUS

## 2015-01-31 MED ORDER — METOPROLOL SUCCINATE ER 50 MG PO TB24
50.0000 mg | ORAL_TABLET | Freq: Every day | ORAL | Status: DC
  Administered 2015-02-01 – 2015-02-09 (×9): 50 mg via ORAL
  Filled 2015-01-31 (×10): qty 1

## 2015-01-31 MED ORDER — FLUOXETINE HCL 20 MG PO CAPS
20.0000 mg | ORAL_CAPSULE | Freq: Two times a day (BID) | ORAL | Status: DC
  Administered 2015-01-31 – 2015-02-09 (×19): 20 mg via ORAL
  Filled 2015-01-31 (×19): qty 1

## 2015-01-31 MED ORDER — ENOXAPARIN SODIUM 40 MG/0.4ML ~~LOC~~ SOLN
40.0000 mg | SUBCUTANEOUS | Status: DC
  Administered 2015-01-31 – 2015-02-05 (×6): 40 mg via SUBCUTANEOUS
  Filled 2015-01-31 (×5): qty 0.4

## 2015-01-31 MED ORDER — ACETAMINOPHEN 325 MG PO TABS: 650.0000 mg | ORAL_TABLET | Freq: Four times a day (QID) | ORAL | Status: DC | PRN

## 2015-01-31 MED ORDER — BUSPIRONE HCL 10 MG PO TABS
15.0000 mg | ORAL_TABLET | Freq: Three times a day (TID) | ORAL | Status: DC
  Administered 2015-01-31 – 2015-02-09 (×26): 15 mg via ORAL
  Filled 2015-01-31 (×26): qty 2

## 2015-01-31 MED ORDER — GUAIFENESIN ER 600 MG PO TB12
600.0000 mg | ORAL_TABLET | Freq: Two times a day (BID) | ORAL | Status: DC
  Administered 2015-01-31 – 2015-02-09 (×19): 600 mg via ORAL
  Filled 2015-01-31 (×19): qty 1

## 2015-01-31 MED ORDER — IPRATROPIUM-ALBUTEROL 0.5-2.5 (3) MG/3ML IN SOLN
3.0000 mL | Freq: Four times a day (QID) | RESPIRATORY_TRACT | Status: DC
Start: 2015-01-31 — End: 2015-02-01
  Administered 2015-01-31 – 2015-02-01 (×8): 3 mL via RESPIRATORY_TRACT
  Filled 2015-01-31 (×8): qty 3

## 2015-01-31 MED ORDER — NICOTINE 14 MG/24HR TD PT24
14.0000 mg | MEDICATED_PATCH | Freq: Every day | TRANSDERMAL | Status: DC
  Administered 2015-01-31 – 2015-02-09 (×10): 14 mg via TRANSDERMAL
  Filled 2015-01-31 (×10): qty 1

## 2015-01-31 MED ORDER — MORPHINE SULFATE (PF) 2 MG/ML IV SOLN
1.0000 mg | INTRAVENOUS | Status: DC | PRN
  Administered 2015-01-31 – 2015-02-07 (×7): 1 mg via INTRAVENOUS
  Filled 2015-01-31 (×7): qty 1

## 2015-01-31 MED ORDER — VENLAFAXINE HCL ER 37.5 MG PO CP24
150.0000 mg | ORAL_CAPSULE | Freq: Every day | ORAL | Status: DC
  Administered 2015-02-01 – 2015-02-09 (×8): 150 mg via ORAL
  Filled 2015-01-31 (×8): qty 4

## 2015-01-31 MED ORDER — ACETAMINOPHEN 650 MG RE SUPP: 650.0000 mg | Freq: Four times a day (QID) | RECTAL | Status: DC | PRN

## 2015-01-31 MED ORDER — METHYLPREDNISOLONE SODIUM SUCC 125 MG IJ SOLR
60.0000 mg | INTRAMUSCULAR | Status: DC
  Administered 2015-01-31 – 2015-02-05 (×6): 60 mg via INTRAVENOUS
  Filled 2015-01-31 (×6): qty 2

## 2015-01-31 MED ORDER — ONDANSETRON HCL 4 MG PO TABS: 4.0000 mg | ORAL_TABLET | Freq: Four times a day (QID) | ORAL | Status: DC | PRN

## 2015-01-31 MED ORDER — ALBUTEROL SULFATE (2.5 MG/3ML) 0.083% IN NEBU
2.5000 mg | INHALATION_SOLUTION | RESPIRATORY_TRACT | Status: DC | PRN
  Administered 2015-02-02: 06:00:00 2.5 mg via RESPIRATORY_TRACT
  Filled 2015-01-31: qty 3

## 2015-01-31 MED ORDER — FAMOTIDINE 20 MG PO TABS
20.0000 mg | ORAL_TABLET | Freq: Two times a day (BID) | ORAL | Status: DC
  Administered 2015-01-31 – 2015-02-09 (×20): 20 mg via ORAL
  Filled 2015-01-31 (×19): qty 1

## 2015-01-31 MED ORDER — HYDROCOD POLST-CPM POLST ER 10-8 MG/5ML PO SUER
5.0000 mL | Freq: Two times a day (BID) | ORAL | Status: DC
  Administered 2015-01-31 – 2015-02-09 (×19): 5 mL via ORAL
  Filled 2015-01-31 (×19): qty 5

## 2015-01-31 MED ORDER — GABAPENTIN 300 MG PO CAPS
300.0000 mg | ORAL_CAPSULE | Freq: Two times a day (BID) | ORAL | Status: DC
  Administered 2015-01-31 – 2015-02-09 (×19): 300 mg via ORAL
  Filled 2015-01-31 (×19): qty 1

## 2015-01-31 MED ORDER — ONDANSETRON HCL 4 MG/2ML IJ SOLN: 4.0000 mg | Freq: Four times a day (QID) | INTRAMUSCULAR | Status: DC | PRN

## 2015-01-31 MED ORDER — ASPIRIN EC 81 MG PO TBEC
81.0000 mg | DELAYED_RELEASE_TABLET | Freq: Every day | ORAL | Status: DC
  Administered 2015-01-31 – 2015-02-09 (×10): 81 mg via ORAL
  Filled 2015-01-31 (×11): qty 1

## 2015-01-31 MED ORDER — DILTIAZEM HCL 30 MG PO TABS
30.0000 mg | ORAL_TABLET | Freq: Four times a day (QID) | ORAL | Status: DC
  Administered 2015-01-31 – 2015-02-01 (×5): 30 mg via ORAL
  Filled 2015-01-31 (×5): qty 1

## 2015-01-31 MED ORDER — POTASSIUM CHLORIDE IN NACL 20-0.9 MEQ/L-% IV SOLN
INTRAVENOUS | Status: DC
  Administered 2015-01-31 – 2015-02-02 (×4): via INTRAVENOUS
  Filled 2015-01-31 (×4): qty 1000

## 2015-01-31 NOTE — H&P (Signed)
[name] is an 49 y.o. male.   Chief Complaint: Chest pain HPI: The patient presents emergency department complaining of left-sided chest pain that began after he ate. The pain radiated into his neck, down his left arm and into his abdomen as well. He describes the pain as sharp and stabbing under his left breast. He reports 10 out of 10 in severity of pain. He was given aspirin 325 mg in route to the hospital where a nitroglycerin patch was placed on his chest. The patient still reports 10 out of 10 chest pain despite these interventions. Past medical history significant for admission to the hospital last week for the same complaints. Lexiscan was low probability for myocardial ischemia. The emergency department physician's concern with this encounter is that previous CT scan of the chest raise the possibility of chronic inflammatory changes that could be consistent with miliary TB. The patient admits to shortness of breath and fatigue that are present even when he does not have chest pain, but he denies fever, weight loss, night sweats or hemoptysis. These concerns in addition to the patient's continued chest pain prompted the emergency department staff to call for admission.  Past Medical History  Diagnosis Date  . Polysubstance abuse   . Bipolar disorder ([place])   . Esophageal stricture   . H/O tonic-clonic seizures   . Anxiety   . GERD (gastroesophageal reflux disease)   . Chronic pain   . Asthma   . Hypertension     Past Surgical History  Procedure Laterality Date  . Appendectomy      complicated by perforation and abscess  . Peg w/tracheostomy placement    . Peg tube removal    . Tracheostomy closure    . Esophagogastroduodenoscopy (egd) with esophageal dilation    . Hernia repair  Nov 2016    ventral hernia repair at [facility] by Dr. Tod Persia    Family History  Problem Relation Age of Onset  . CAD Other   . Diabetes Other    Social History:  reports that he has been smoking  Cigarettes.  He has been smoking about 1.00 pack per day. He does not have any smokeless tobacco history on file. He reports that he uses illicit drugs (Marijuana). He reports that he does not drink alcohol.  Allergies: No Known Allergies  Prior to Admission medications   Medication Sig Start Date End Date Taking? Authorizing Provider  atorvastatin (LIPITOR) 10 MG tablet Take 10 mg by mouth every evening.   Yes Historical Provider, MD  benzonatate (TESSALON) 200 MG capsule Take 1 capsule (200 mg total) by mouth 3 (three) times daily as needed for cough. 01/20/15  Yes Aldean Jewett, MD  busPIRone (BUSPAR) 15 MG tablet Take 1 tablet (15 mg total) by mouth 3 (three) times daily. 12/29/14  Yes Hinda Kehr, MD  clonazePAM (KLONOPIN) 0.5 MG tablet Take 1 tablet (0.5 mg total) by mouth 3 (three) times daily as needed. Patient taking differently: Take 0.5 mg by mouth 3 (three) times daily as needed for anxiety.  12/29/14  Yes Hinda Kehr, MD  diltiazem (CARDIZEM) 30 MG tablet Take 1 tablet (30 mg total) by mouth 4 (four) times daily. 12/29/14  Yes Hinda Kehr, MD  FLUoxetine (PROZAC) 20 MG capsule Take 20 mg by mouth 2 (two) times daily.   Yes Historical Provider, MD  fluticasone (FLONASE) 50 MCG/ACT nasal spray Place 2 sprays into both nostrils daily.   Yes Historical Provider, MD  gabapentin (NEURONTIN) 300  MG capsule Take 1 capsule (300 mg total) by mouth 2 (two) times daily. 12/29/14  Yes Hinda Kehr, MD  HYDROcodone-acetaminophen (NORCO/VICODIN) 5-325 MG tablet Take 1-2 tablets by mouth every 4 (four) hours as needed for moderate pain.   Yes Historical Provider, MD  levofloxacin (LEVAQUIN) 500 MG tablet Take 1 tablet (500 mg total) by mouth daily. 01/19/15  Yes Epifanio Lesches, MD  loratadine (CLARITIN) 10 MG tablet Take 10 mg by mouth daily.   Yes Historical Provider, MD  metaxalone (SKELAXIN) 800 MG tablet Take 1 tablet (800 mg total) by mouth 3 (three) times daily. 01/19/15  Yes Epifanio Lesches, MD  metoprolol succinate (TOPROL-XL) 50 MG 24 hr tablet Take 50 mg by mouth daily.   Yes Historical Provider, MD  pantoprazole (PROTONIX) 40 MG tablet Take 1 tablet (40 mg total) by mouth daily. 12/29/14  Yes Hinda Kehr, MD  venlafaxine XR (EFFEXOR-XR) 150 MG 24 hr capsule Take 1 capsule (150 mg total) by mouth daily with breakfast. 01/05/15  Yes Clovis Fredrickson, MD     Results for orders placed or performed during the hospital encounter of 01/30/15 (from the past 48 hour(s))  Comprehensive metabolic panel     Status: Abnormal   Collection Time: 01/30/15  8:54 PM  Result Value Ref Range   Sodium 141 135 - 145 mmol/L   Potassium 3.8 3.5 - 5.1 mmol/L   Chloride 104 101 - 111 mmol/L   CO2 30 22 - 32 mmol/L   Glucose, Bld 158 (H) 65 - 99 mg/dL   BUN 11 6 - 20 mg/dL   Creatinine, Ser 1.06 0.61 - 1.24 mg/dL   Calcium 9.0 8.9 - 10.3 mg/dL   Total Protein 7.2 6.5 - 8.1 g/dL   Albumin 3.4 (L) 3.5 - 5.0 g/dL   AST 18 15 - 41 U/L   ALT 14 (L) 17 - 63 U/L   Alkaline Phosphatase 80 38 - 126 U/L   Total Bilirubin 0.3 0.3 - 1.2 mg/dL   GFR calc non Af Amer >60 >60 mL/min   GFR calc Af Amer >60 >60 mL/min    Comment: (NOTE) The eGFR has been calculated using the CKD EPI equation. This calculation has not been validated in all clinical situations. eGFR's persistently <60 mL/min signify possible Chronic Kidney Disease.    Anion gap 7 5 - 15  Lipase, blood     Status: None   Collection Time: 01/30/15  8:54 PM  Result Value Ref Range   Lipase 29 11 - 51 U/L  Troponin I     Status: None   Collection Time: 01/30/15  8:54 PM  Result Value Ref Range   Troponin I <0.03 <0.031 ng/mL    Comment:        NO INDICATION OF MYOCARDIAL INJURY.   Lactic acid, plasma     Status: None   Collection Time: 01/30/15  8:54 PM  Result Value Ref Range   Lactic Acid, Venous 2.0 0.5 - 2.0 mmol/L  CBC with Differential     Status: Abnormal   Collection Time: 01/30/15  8:54 PM  Result Value Ref  Range   WBC 10.3 3.8 - 10.6 K/uL   RBC 4.50 4.40 - 5.90 MIL/uL   Hemoglobin 14.0 13.0 - 18.0 g/dL   HCT 41.8 40.0 - 52.0 %   MCV 93.0 80.0 - 100.0 fL   MCH 31.1 26.0 - 34.0 pg   MCHC 33.4 32.0 - 36.0 g/dL   RDW 15.5 (H) 11.5 -  14.5 %   Platelets 133 (L) 150 - 440 K/uL   Neutrophils Relative % 65 %   Neutro Abs 6.7 (H) 1.4 - 6.5 K/uL   Lymphocytes Relative 26 %   Lymphs Abs 2.6 1.0 - 3.6 K/uL   Monocytes Relative 6 %   Monocytes Absolute 0.6 0.2 - 1.0 K/uL   Eosinophils Relative 2 %   Eosinophils Absolute 0.2 0 - 0.7 K/uL   Basophils Relative 1 %   Basophils Absolute 0.1 0 - 0.1 K/uL   Ct Abdomen Pelvis W Contrast  01/30/2015  CLINICAL DATA:  Acute onset of left-sided chest pain and epigastric abdominal pain. Initial encounter. EXAM: CT ABDOMEN AND PELVIS WITH CONTRAST TECHNIQUE: Multidetector CT imaging of the abdomen and pelvis was performed using the standard protocol following bolus administration of intravenous contrast. CONTRAST:  128mL OMNIPAQUE IOHEXOL 300 MG/ML  SOLN COMPARISON:  CT of the abdomen and pelvis performed 05/27/2014 FINDINGS: Minimal bibasilar atelectasis is noted. The liver and spleen are unremarkable in appearance. The gallbladder is within normal limits. Minimal calcification within the pancreas may reflect sequelae of chronic pancreatitis. The pancreas and adrenal glands are otherwise unremarkable. The kidneys are unremarkable in appearance. There is no evidence of hydronephrosis. No renal or ureteral stones are seen. Mild nonspecific perinephric stranding is noted bilaterally. No free fluid is identified. The small bowel is unremarkable in appearance. The stomach is within normal limits. No acute vascular abnormalities are seen. Scattered calcification is noted along the abdominal aorta and its branches. The patient is status post appendectomy. Mild diverticulosis is noted along the proximal sigmoid colon, without evidence of diverticulitis. The bladder is mildly  distended and grossly unremarkable. The prostate remains normal in size. No inguinal lymphadenopathy is seen. No acute osseous abnormalities are identified. IMPRESSION: 1. No acute abnormality seen within the abdomen or pelvis. 2. Minimal calcification within the pancreas may reflect sequelae of chronic pancreatitis. 3. Scattered calcification along the abdominal aorta and its branches. 4. Mild diverticulosis along the proximal sigmoid colon, without evidence of diverticulitis. Electronically Signed   By: Garald Balding M.D.   On: 01/30/2015 22:31   Dg Chest Portable 1 View  01/30/2015  CLINICAL DATA:  Left-sided chest pain with anxiety and heartburn 3 days. EXAM: PORTABLE CHEST 1 VIEW COMPARISON:  01/18/2015, 05/20/2014 and chest CT 01/19/2015, CT 07/22/2013, 06/13/2013 FINDINGS: Lungs are adequately inflated without focal consolidation or effusion. There is continued bilateral interstitial prominence without significant change likely a chronic atypical infectious or inflammatory process. More discrete 8 mm nodule over the right midlung lateral to the hilum unchanged. Cardiomediastinal silhouette and remainder of the exam is unchanged. IMPRESSION: Chronic bilateral interstitial process likely an atypical infectious or inflammatory process. 8 mm nodular density lateral to the right hilum unchanged from CT 06/13/2013. Recommend 1 additional follow-up chest CT in 6 months to document 2 years of stability. This recommendation follows the consensus statement: Guidelines for Management of Small Pulmonary Nodules Detected on CT Scans: A Statement from the [employer] as published in Radiology 2005; 237:395-400. Online at: [email or web address] Electronically Signed   By: Marin Olp M.D.   On: 01/30/2015 20:43    Review of Systems  Constitutional: Positive for malaise/fatigue. Negative for fever and chills.  HENT: Negative for sore throat and tinnitus.   Eyes: Negative  for blurred vision and redness.  Respiratory: Positive for cough and shortness of breath. Negative for sputum production.   Cardiovascular: Positive for chest pain. Negative for palpitations, orthopnea and PND.  Gastrointestinal:  Positive for abdominal pain. Negative for nausea, vomiting and diarrhea.  Genitourinary: Negative for dysuria, urgency and frequency.  Musculoskeletal: Negative for myalgias and joint pain.  Skin: Negative for rash.       No lesions  Neurological: Negative for speech change, focal weakness and weakness.  Endo/Heme/Allergies: Does not bruise/bleed easily.       No temperature intolerance  Psychiatric/Behavioral: Negative for depression and suicidal ideas.    Blood pressure 111/80, pulse 72, temperature 98.2 F (36.8 C), resp. rate 12, height _0  (1.702 m), weight 85.276 kg (188 lb), SpO2 99 %. Physical Exam  Nursing note and vitals reviewed. Constitutional: He is oriented to person, place, and time. He appears well-developed and well-nourished. No distress.  HENT:  Head: Normocephalic.  Mouth/Throat: Oropharynx is clear and moist.  Eyes: Conjunctivae and EOM are normal. Pupils are equal, round, and reactive to light. No scleral icterus.  Neck: Normal range of motion. Neck supple. No JVD present. No tracheal deviation present. No thyromegaly present.  Cardiovascular: Normal rate, regular rhythm and normal heart sounds.  Exam reveals no gallop and no friction rub.   No murmur heard. Respiratory: Effort normal. He has wheezes. He has no rales.  GI: Soft. Bowel sounds are normal. He exhibits no distension. There is no tenderness.  Well-healed midline scar hernia repair  Genitourinary:  Deferred  Musculoskeletal: Normal range of motion. He exhibits no edema.  Lymphadenopathy:    He has no cervical adenopathy.  Neurological: He is alert and oriented to person, place, and time. No cranial nerve deficit.  Skin: Skin is warm and dry. No rash noted. No erythema.   Psychiatric: He has a normal mood and affect. His behavior is normal. Judgment and thought content normal.     Assessment/Plan This is a 49 year old Caucasian male admitted for atypical chest pain and unclear TB status. 1. Chest pain: Atypical as the patient's pain has lasted for hours and is sharp in character. Low probability of myocardial ischemia due to negative nuclear stress test one week ago. [name] cardiac enzymes remain negative as they were on previous cardiac evaluation. EKG and telemetry show no signs of myocardial ischemia. Nonetheless the patient continues to report pain. We will track his cardiac enzymes and monitor telemetry. He does have wheezing on physical exam at a history of tobacco abuse. He likely has COPD but this diagnosis is not on his chronic medical problem list. I have ordered Combivent treatments and albuterol as needed. We will defer inhaled corticosteroid treatment at this time until we clarify the results of the patient's TB test. Add aspirin to the patient's daily regimen. 2. TB exposure: Patient reports a positive "TB test" many years ago when he was isolated for 72 hours during a short jail sentence. He denies mandatory follow-up with the local health department at that time. He reports further testing when he followed-up with his primary care doctor after hospitalization last week. I do not have access to these lab results at this time. The patient will be in respiratory isolation for now. Also, the patient just moved into a nursing home. (He states that he is disabled due to recurrent falls as well as mental instability).  3. Hypertension: Continue metoprolol 4. Irregular heartbeat: Intermittent; continue diltiazem 5. Tobacco abuse: NicoDerm patch 6. Depression: Continue Prozac, BuSpar, Effexor and clonazepam 7. Gastric reflux: Could be the source of all the patient's pain at this time. Add H2 blocker 8. DVT prophylaxis: Lovenox 9. GI prophylaxis as  above  The patient is full code. Time spent on admission orders and patient care approximately 45 minutes   Harrie Foreman 01/31/2015, 1:25 AM

## 2015-01-31 NOTE — Progress Notes (Signed)
[facility] [place] [facility]   PATIENT NAME: [name]    MR#:  [id number]  DATE OF BIRTH:  [birth year]  SUBJECTIVE:  CHIEF COMPLAINT:   Chief Complaint  Patient presents with  . Chest Pain   patient is a 49 year old Caucasian male with past medical history significant for history of ongoing tobacco abuse, gastric esophageal reflux disease, chronic pain, asthma who presents to the hospital with complaints of chest pains, shortness of breath, wheezing, cough, and a yellow green-looking phlegm. Because of his abnormal CT scan concerning for TB. He was placed in respiratory isolation, infectious disease specialist consultation was requested. Patient complains of chest and abdominal pains. Admits of cough. Pain worsens with cough  Review of Systems  Constitutional: Negative for fever, chills and weight loss.  HENT: Negative for congestion.   Eyes: Negative for blurred vision and double vision.  Respiratory: Positive for cough, sputum production, shortness of breath and wheezing.   Cardiovascular: Negative for chest pain, palpitations, orthopnea, leg swelling and PND.  Gastrointestinal: Negative for nausea, vomiting, abdominal pain, diarrhea, constipation and blood in stool.  Genitourinary: Negative for dysuria, urgency, frequency and hematuria.  Musculoskeletal: Negative for falls.  Neurological: Negative for dizziness, tremors, focal weakness and headaches.  Endo/Heme/Allergies: Does not bruise/bleed easily.  Psychiatric/Behavioral: Negative for depression. The patient does not have insomnia.     VITAL SIGNS: Blood pressure 97/65, pulse 75, temperature 99.1 F (37.3 C), temperature source Oral, resp. rate 20, height  (1.702 m), weight 85.503 kg (188 lb 8 oz), SpO2 96 %.  PHYSICAL EXAMINATION:   GENERAL:  49 y.o.-year-old patient lying in the bed with no acute distress.  EYES: Pupils equal, round, reactive to light and accommodation. No scleral  icterus. Extraocular muscles intact.  HEENT: Head atraumatic, normocephalic. Oropharynx and nasopharynx clear.  NECK:  Supple, no jugular venous distention. No thyroid enlargement, no tenderness.  LUNGS: Markedly diminished breath sounds bilaterally, diffuse wheezing, no significant rales,rhonchi or crepitation. Using accessory muscles of respiration, especially on exertion.  CARDIOVASCULAR: S1, S2 normal. No murmurs, rubs, or gallops. Tender chest on palpation diffusely ABDOMEN: Soft, diffusely tender to light touch, no rebound or guarding were noted, nondistended. Bowel sounds present. No organomegaly or mass.  EXTREMITIES: No pedal edema, cyanosis, or clubbing.  NEUROLOGIC: Cranial nerves II through XII are intact. Muscle strength 5/5 in all extremities. Sensation intact. Gait not checked.  PSYCHIATRIC: The patient is alert and oriented x 3.  SKIN: No obvious rash, lesion, or ulcer.   ORDERS/RESULTS REVIEWED:   CBC  Recent Labs Lab 01/30/15 2054 01/31/15 0636  WBC 10.3 8.4  HGB 14.0 13.4  HCT 41.8 39.8*  PLT 133* 118*  MCV 93.0 92.8  MCH 31.1 31.2  MCHC 33.4 33.7  RDW 15.5* 15.2*  LYMPHSABS 2.6  --   MONOABS 0.6  --   EOSABS 0.2  --   BASOSABS 0.1  --    ------------------------------------------------------------------------------------------------------------------  Chemistries   Recent Labs Lab 01/30/15 2054  NA 141  K 3.8  CL 104  CO2 30  GLUCOSE 158*  BUN 11  CREATININE 1.06  CALCIUM 9.0  AST 18  ALT 14*  ALKPHOS 80  BILITOT 0.3   ------------------------------------------------------------------------------------------------------------------ estimated creatinine clearance is 86.2 mL/min (by C-G formula based on Cr of 1.06). ------------------------------------------------------------------------------------------------------------------  Recent Labs  01/31/15 0636  TSH 1.531    Cardiac Enzymes  Recent Labs Lab 01/31/15 0045 01/31/15 0636  01/31/15 1305  TROPONINI <0.03 <0.03 <0.03   ------------------------------------------------------------------------------------------------------------------  Invalid input(s): POCBNP ---------------------------------------------------------------------------------------------------------------  RADIOLOGY: Ct Abdomen Pelvis W Contrast  01/30/2015  CLINICAL DATA:  Acute onset of left-sided chest pain and epigastric abdominal pain. Initial encounter. EXAM: CT ABDOMEN AND PELVIS WITH CONTRAST TECHNIQUE: Multidetector CT imaging of the abdomen and pelvis was performed using the standard protocol following bolus administration of intravenous contrast. CONTRAST:  100mL OMNIPAQUE IOHEXOL 300 MG/ML  SOLN COMPARISON:  CT of the abdomen and pelvis performed 05/27/2014 FINDINGS: Minimal bibasilar atelectasis is noted. The liver and spleen are unremarkable in appearance. The gallbladder is within normal limits. Minimal calcification within the pancreas may reflect sequelae of chronic pancreatitis. The pancreas and adrenal glands are otherwise unremarkable. The kidneys are unremarkable in appearance. There is no evidence of hydronephrosis. No renal or ureteral stones are seen. Mild nonspecific perinephric stranding is noted bilaterally. No free fluid is identified. The small bowel is unremarkable in appearance. The stomach is within normal limits. No acute vascular abnormalities are seen. Scattered calcification is noted along the abdominal aorta and its branches. The patient is status post appendectomy. Mild diverticulosis is noted along the proximal sigmoid colon, without evidence of diverticulitis. The bladder is mildly distended and grossly unremarkable. The prostate remains normal in size. No inguinal lymphadenopathy is seen. No acute osseous abnormalities are identified. IMPRESSION: 1. No acute abnormality seen within the abdomen or pelvis. 2. Minimal calcification within the pancreas may reflect sequelae of  chronic pancreatitis. 3. Scattered calcification along the abdominal aorta and its branches. 4. Mild diverticulosis along the proximal sigmoid colon, without evidence of diverticulitis. Electronically Signed   By: Roanna RaiderJeffery  Chang M.D.   On: 01/30/2015 22:31   Dg Chest Portable 1 View  01/30/2015  CLINICAL DATA:  Left-sided chest pain with anxiety and heartburn 3 days. EXAM: PORTABLE CHEST 1 VIEW COMPARISON:  01/18/2015, 05/20/2014 and chest CT 01/19/2015, CT 07/22/2013, 06/13/2013 FINDINGS: Lungs are adequately inflated without focal consolidation or effusion. There is continued bilateral interstitial prominence without significant change likely a chronic atypical infectious or inflammatory process. More discrete 8 mm nodule over the right midlung lateral to the hilum unchanged. Cardiomediastinal silhouette and remainder of the exam is unchanged. IMPRESSION: Chronic bilateral interstitial process likely an atypical infectious or inflammatory process. 8 mm nodular density lateral to the right hilum unchanged from CT 06/13/2013. Recommend 1 additional follow-up chest CT in 6 months to document 2 years of stability. This recommendation follows the consensus statement: Guidelines for Management of Small Pulmonary Nodules Detected on CT Scans: A Statement from the Fleischner Society as published in Radiology 2005; 237:395-400. Online at: [email or web address]/rad/[email or web address]. Electronically Signed   By: Elberta Fortisaniel  Boyle M.D.   On: 01/30/2015 20:43    EKG:  Orders placed or performed during the hospital encounter of 01/30/15  . ED EKG  . ED EKG    ASSESSMENT AND PLAN:  Active Problems:   Chest pain 1. COPD exacerbation due to bacterial pneumonia, continue patient on levofloxacin, adding steroids. Continue nebulizer therapy, following clinically 2. Chest pain likely musculoskeletal related to cough, supportive therapy, add Humibid and Tussionex 3. Abnormal CT scan concerning for TB,  infectious disease consultation was requested and Dr. Sampson GoonFitzgerald recommended QuantiFERON as well as H I V testing , AFB sputum 4. Bacterial pneumonia. Sputum cultures. Continue patient on levofloxacin, Following clinically 5. Hypotension , initiate patient on low rate IV fluids,  6. Tobacco abuse. Counseling. Nicotine replacement therapy is initiated. Discussed this patient for approximate 5 minutes    Management plans discussed with the patient, family and they  are in agreement.   DRUG ALLERGIES: No Known Allergies  CODE STATUS:     Code Status Orders        Start     Ordered   01/31/15 0343  Full code   Continuous     01/31/15 0342    Advance Directive Documentation        Most Recent Value   Type of Advance Directive  Living will   Pre-existing out of facility DNR order (yellow form or pink MOST form)     "MOST" Form in Place?        TOTAL TIME TAKING CARE OF THIS PATIENT: 40tes.    Katharina Caper M.D on 01/31/2015 at 3:43 PM  Between 7am to 6pm - Pager - [phone]  After 6pm go to [email or web address] - password [email or web address] [facility]  [facility]  Office  [phone]  CC: Primary care physician; Lanier Ensign KEY, MD

## 2015-01-31 NOTE — Progress Notes (Signed)
Pleasant day. No resp distress. sats drop some when off o2.  ivfs started this pm.

## 2015-01-31 NOTE — Consult Note (Signed)
[facility] Infectious Disease     Reason for Consult:TB suspect   Referring Physician: Ether Griffins Date of Admission:  01/30/2015   Active Problems:   Chest pain   HPI: [name] is a 49 y.o. male with hx of etoh liver disease, Poly substance abuse, esophageal stricture, Gerd  whom I saw in April 2015 due to ruptured appendix and candidemia.   He was readmitted 11/3-11/5 with chest pain, had neg stress test and was dced. 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 Readmitted 11/15 with continued chest pain. He states that he has not felt himself since his surgery a year and a half ago with chronic pain in abd and L side of chest. He follows with Dr Gwynneth Aliment at [facility]. He says only started eating reg food 3 months ago. Has not been losing wt but has night sweats, no fevers. He does have a cough but no hemoptysis. Was in prison for 45 days in 2005 and had TB test but then was "cleared" - does not sound like he had treatment.  Past Medical History  Diagnosis Date  . Polysubstance abuse   . Bipolar disorder ([place])   . Esophageal stricture   . H/O tonic-clonic seizures   . Anxiety   . GERD (gastroesophageal reflux disease)   . Chronic pain   . Asthma   . Hypertension    Past Surgical History  Procedure Laterality Date  . Appendectomy      complicated by perforation and abscess  . Peg w/tracheostomy placement    . Peg tube removal    . Tracheostomy closure    . Esophagogastroduodenoscopy (egd) with esophageal dilation    . Hernia repair  Nov 2016    ventral hernia repair at [facility] by Dr. Tod Persia   Social History  Substance Use Topics  . Smoking status: Current Every Day Smoker -- 1.00 packs/day    Types: Cigarettes  . Smokeless tobacco: None  . Alcohol Use: No   Family History  Problem Relation Age of Onset  . CAD Other   . Diabetes Other     Allergies: No Known Allergies  Current antibiotics: Antibiotics Given  (last 72 hours)    Date/Time Action Medication Dose Rate   01/31/15 1217 Given   levofloxacin (LEVAQUIN) IVPB 500 mg 500 mg 100 mL/hr      MEDICATIONS: . aspirin EC  81 mg Oral Daily  . clotrimazole   Topical BID  . enoxaparin (LOVENOX) injection  40 mg Subcutaneous Q24H  . famotidine  20 mg Oral BID  . ipratropium-albuterol  3 mL Nebulization Q6H  . levofloxacin (LEVAQUIN) IV  500 mg Intravenous Q24H  . methylPREDNISolone (SOLU-MEDROL) injection  60 mg Intravenous Q24H  . nicotine  14 mg Transdermal Daily  . sodium chloride  3 mL Intravenous Q12H    Review of Systems - 11 systems reviewed and negative per HPI   OBJECTIVE: Temp:  [97.9 F (36.6 C)-99.1 F (37.3 C)] 99.1 F (37.3 C) (11/16 1235) Pulse Rate:  [43-94] 75 (11/16 1235) Resp:  [10-20] 20 (11/16 1235) BP: (90-130)/(64-93) 97/65 mmHg (11/16 1235) SpO2:  [90 %-99 %] 96 % (11/16 1323) Weight:  [85.276 kg (188 lb)-85.503 kg (188 lb 8 oz)] 85.503 kg (188 lb 8 oz) (11/16 0500) Physical Exam  Constitutional: He is oriented to person, place, and time. Obese HENT: PERRLA Mouth/Throat: Oropharynx is clear and moist. No thrush No oropharyngeal exudate.  Cardiovascular: Normal rate, regular  rhythm and normal heart sounds.  Pulmonary/Chest: rhonchi and wheeze throughout Abdominal: Soft but somewhat distended. Bowel sounds are normal. He exhibits no distension. There is mild diffuse tenderness Lymphadenopathy: He has no cervical adenopathy.  Neurological: He is alert and oriented to person, place, and time.  Skin: Skin is warm and dry. No rash noted. No erythema.  Psychiatric: He has a normal mood and affect. His behavior is normal.   LABS: Results for orders placed or performed during the hospital encounter of 01/30/15 (from the past 48 hour(s))  Comprehensive metabolic panel     Status: Abnormal   Collection Time: 01/30/15  8:54 PM  Result Value Ref Range   Sodium 141 135 - 145 mmol/L   Potassium 3.8 3.5 - 5.1 mmol/L    Chloride 104 101 - 111 mmol/L   CO2 30 22 - 32 mmol/L   Glucose, Bld 158 (H) 65 - 99 mg/dL   BUN 11 6 - 20 mg/dL   Creatinine, Ser 1.06 0.61 - 1.24 mg/dL   Calcium 9.0 8.9 - 10.3 mg/dL   Total Protein 7.2 6.5 - 8.1 g/dL   Albumin 3.4 (L) 3.5 - 5.0 g/dL   AST 18 15 - 41 U/L   ALT 14 (L) 17 - 63 U/L   Alkaline Phosphatase 80 38 - 126 U/L   Total Bilirubin 0.3 0.3 - 1.2 mg/dL   GFR calc non Af Amer >60 >60 mL/min   GFR calc Af Amer >60 >60 mL/min    Comment: (NOTE) The eGFR has been calculated using the CKD EPI equation. This calculation has not been validated in all clinical situations. eGFR's persistently <60 mL/min signify possible Chronic Kidney Disease.    Anion gap 7 5 - 15  Lipase, blood     Status: None   Collection Time: 01/30/15  8:54 PM  Result Value Ref Range   Lipase 29 11 - 51 U/L  Troponin I     Status: None   Collection Time: 01/30/15  8:54 PM  Result Value Ref Range   Troponin I <0.03 <0.031 ng/mL    Comment:        NO INDICATION OF MYOCARDIAL INJURY.   Lactic acid, plasma     Status: None   Collection Time: 01/30/15  8:54 PM  Result Value Ref Range   Lactic Acid, Venous 2.0 0.5 - 2.0 mmol/L  CBC with Differential     Status: Abnormal   Collection Time: 01/30/15  8:54 PM  Result Value Ref Range   WBC 10.3 3.8 - 10.6 K/uL   RBC 4.50 4.40 - 5.90 MIL/uL   Hemoglobin 14.0 13.0 - 18.0 g/dL   HCT 41.8 40.0 - 52.0 %   MCV 93.0 80.0 - 100.0 fL   MCH 31.1 26.0 - 34.0 pg   MCHC 33.4 32.0 - 36.0 g/dL   RDW 15.5 (H) 11.5 - 14.5 %   Platelets 133 (L) 150 - 440 K/uL   Neutrophils Relative % 65 %   Neutro Abs 6.7 (H) 1.4 - 6.5 K/uL   Lymphocytes Relative 26 %   Lymphs Abs 2.6 1.0 - 3.6 K/uL   Monocytes Relative 6 %   Monocytes Absolute 0.6 0.2 - 1.0 K/uL   Eosinophils Relative 2 %   Eosinophils Absolute 0.2 0 - 0.7 K/uL   Basophils Relative 1 %   Basophils Absolute 0.1 0 - 0.1 K/uL  Lactic acid, plasma     Status: None   Collection Time: 01/31/15 12:45 AM  Result Value Ref Range   Lactic Acid, Venous 1.3 0.5 - 2.0 mmol/L  Troponin I     Status: None   Collection Time: 01/31/15 12:45 AM  Result Value Ref Range   Troponin I <0.03 <0.031 ng/mL    Comment:        NO INDICATION OF MYOCARDIAL INJURY.   Urinalysis complete, with microscopic     Status: Abnormal   Collection Time: 01/31/15  1:50 AM  Result Value Ref Range   Color, Urine YELLOW (A) YELLOW   APPearance CLEAR (A) CLEAR   Glucose, UA NEGATIVE NEGATIVE mg/dL   Bilirubin Urine NEGATIVE NEGATIVE   Ketones, ur NEGATIVE NEGATIVE mg/dL   Specific Gravity, Urine 1.055 (H) 1.005 - 1.030   Hgb urine dipstick NEGATIVE NEGATIVE   pH 5.0 5.0 - 8.0   Protein, ur NEGATIVE NEGATIVE mg/dL   Nitrite NEGATIVE NEGATIVE   Leukocytes, UA NEGATIVE NEGATIVE   RBC / HPF 0-5 0 - 5 RBC/hpf   WBC, UA 0-5 0 - 5 WBC/hpf   Bacteria, UA NONE SEEN NONE SEEN   Squamous Epithelial / LPF NONE SEEN NONE SEEN   Mucous PRESENT   MRSA PCR Screening     Status: None   Collection Time: 01/31/15  4:30 AM  Result Value Ref Range   MRSA by PCR NEGATIVE NEGATIVE    Comment:        The GeneXpert MRSA Assay (FDA approved for NASAL specimens only), is one component of a comprehensive MRSA colonization surveillance program. It is not intended to diagnose MRSA infection nor to guide or monitor treatment for MRSA infections.   CBC     Status: Abnormal   Collection Time: 01/31/15  6:36 AM  Result Value Ref Range   WBC 8.4 3.8 - 10.6 K/uL   RBC 4.28 (L) 4.40 - 5.90 MIL/uL   Hemoglobin 13.4 13.0 - 18.0 g/dL   HCT 39.8 (L) 40.0 - 52.0 %   MCV 92.8 80.0 - 100.0 fL   MCH 31.2 26.0 - 34.0 pg   MCHC 33.7 32.0 - 36.0 g/dL   RDW 15.2 (H) 11.5 - 14.5 %   Platelets 118 (L) 150 - 440 K/uL  TSH     Status: None   Collection Time: 01/31/15  6:36 AM  Result Value Ref Range   TSH 1.531 0.350 - 4.500 uIU/mL  Troponin I     Status: None   Collection Time: 01/31/15  6:36 AM  Result Value Ref Range   Troponin I <0.03  <0.031 ng/mL    Comment:        NO INDICATION OF MYOCARDIAL INJURY.    No components found for: ESR, C REACTIVE PROTEIN MICRO: Recent Results (from the past 720 hour(s))  MRSA PCR Screening     Status: None   Collection Time: 01/31/15  4:30 AM  Result Value Ref Range Status   MRSA by PCR NEGATIVE NEGATIVE Final    Comment:        The GeneXpert MRSA Assay (FDA approved for NASAL specimens only), is one component of a comprehensive MRSA colonization surveillance program. It is not intended to diagnose MRSA infection nor to guide or monitor treatment for MRSA infections.     IMAGING: Dg Chest 2 View  01/18/2015  CLINICAL DATA:  Chest pain and anxiety attacks. Shortness of breath and cough. EXAM: CHEST  2 VIEW COMPARISON:  01/01/2015 FINDINGS: Normal heart size and pulmonary vascularity. Diffuse interstitial pattern in the lungs may represent chronic fibrosis   or acute interstitial edema or acute interstitial pneumonitis. Appearance is similar to previous study. No developing consolidation or airspace disease. No blunting of costophrenic angles. No pneumothorax. Mild hyperinflation suggesting emphysema. Degenerative changes in the spine. Mediastinal contours appear intact. Calcified granulomas in the upper lungs. IMPRESSION: Nonspecific interstitial pattern to the lungs may indicate chronic fibrosis or acute edema/pneumonitis. Emphysematous changes. No acute consolidation. Electronically Signed   By: William  Stevens M.D.   On: 01/18/2015 20:39   Ct Angio Chest Pe W/cm &/or Wo Cm  01/19/2015  CLINICAL DATA:  Left-sided chest pain, headache, and blurred vision. Nausea and abdominal pain. EXAM: CT ANGIOGRAPHY CHEST WITH CONTRAST TECHNIQUE: Multidetector CT imaging of the chest was performed using the standard protocol during bolus administration of intravenous contrast. Multiplanar CT image reconstructions and MIPs were obtained to evaluate the vascular anatomy. CONTRAST:  100mL OMNIPAQUE  IOHEXOL 350 MG/ML SOLN COMPARISON:  06/24/2013 FINDINGS: Technically adequate study with good opacification of the central and segmental pulmonary arteries. No focal filling defects demonstrated. No evidence of significant pulmonary embolus. Normal heart size. Normal caliber thoracic aorta. No aortic dissection. Great vessel origins are patent. Enlarged lymph nodes demonstrated throughout the mediastinum and in both hila. Largest right paratracheal lymph node measures about 1.8 cm short axis dimension. Subcarinal lymph nodes measure up to 2.2 cm short axis dimension. Scattered calcifications are demonstrated throughout the lymph nodes. This suggest probable postinflammatory change. Lymphadenopathy was present on the previous study is well. Esophagus is decompressed. Calcified granulomas in the lungs. Fine reticular nodular pattern to the lungs is new since previous study. This could indicate interstitial edema or interstitial pneumonitis. Given presence of calcifications, consider possible reactivation miliary TB. No focal consolidation. No pleural effusions. No pneumothorax. Included portions of the upper abdominal organs are grossly unremarkable. Degenerative changes in the spine. No destructive bone lesions. Review of the MIP images confirms the above findings. IMPRESSION: Enlarged calcified lymph nodes in the hila and mediastinum consistent with postinflammatory etiology. Calcified granulomas in the lungs. Knee reticular nodular interstitial changes suggest interstitial edema or interstitial pneumonia. Consider possible reactivation TB. No evidence of significant pulmonary embolus. Electronically Signed   By: William  Stevens M.D.   On: 01/19/2015 01:13   Ct Abdomen Pelvis W Contrast  01/30/2015  CLINICAL DATA:  Acute onset of left-sided chest pain and epigastric abdominal pain. Initial encounter. EXAM: CT ABDOMEN AND PELVIS WITH CONTRAST TECHNIQUE: Multidetector CT imaging of the abdomen and pelvis was  performed using the standard protocol following bolus administration of intravenous contrast. CONTRAST:  100mL OMNIPAQUE IOHEXOL 300 MG/ML  SOLN COMPARISON:  CT of the abdomen and pelvis performed 05/27/2014 FINDINGS: Minimal bibasilar atelectasis is noted. The liver and spleen are unremarkable in appearance. The gallbladder is within normal limits. Minimal calcification within the pancreas may reflect sequelae of chronic pancreatitis. The pancreas and adrenal glands are otherwise unremarkable. The kidneys are unremarkable in appearance. There is no evidence of hydronephrosis. No renal or ureteral stones are seen. Mild nonspecific perinephric stranding is noted bilaterally. No free fluid is identified. The small bowel is unremarkable in appearance. The stomach is within normal limits. No acute vascular abnormalities are seen. Scattered calcification is noted along the abdominal aorta and its branches. The patient is status post appendectomy. Mild diverticulosis is noted along the proximal sigmoid colon, without evidence of diverticulitis. The bladder is mildly distended and grossly unremarkable. The prostate remains normal in size. No inguinal lymphadenopathy is seen. No acute osseous abnormalities are identified. IMPRESSION: 1. No acute   abnormality seen within the abdomen or pelvis. 2. Minimal calcification within the pancreas may reflect sequelae of chronic pancreatitis. 3. Scattered calcification along the abdominal aorta and its branches. 4. Mild diverticulosis along the proximal sigmoid colon, without evidence of diverticulitis. Electronically Signed   By: Garald Balding M.D.   On: 01/30/2015 22:31   Nm Myocar Multi W/spect W/wall Motion / Ef  01/20/2015   There was no ST segment deviation noted during stress.  The study is normal.  This is a low risk study.  The left ventricular ejection fraction is normal (55-65%).  Normal sestamibi. Normal ef with no evidence of ischemia   Dg Chest Portable 1  View  01/30/2015  CLINICAL DATA:  Left-sided chest pain with anxiety and heartburn 3 days. EXAM: PORTABLE CHEST 1 VIEW COMPARISON:  01/18/2015, 05/20/2014 and chest CT 01/19/2015, CT 07/22/2013, 06/13/2013 FINDINGS: Lungs are adequately inflated without focal consolidation or effusion. There is continued bilateral interstitial prominence without significant change likely a chronic atypical infectious or inflammatory process. More discrete 8 mm nodule over the right midlung lateral to the hilum unchanged. Cardiomediastinal silhouette and remainder of the exam is unchanged. IMPRESSION: Chronic bilateral interstitial process likely an atypical infectious or inflammatory process. 8 mm nodular density lateral to the right hilum unchanged from CT 06/13/2013. Recommend 1 additional follow-up chest CT in 6 months to document 2 years of stability. This recommendation follows the consensus statement: Guidelines for Management of Small Pulmonary Nodules Detected on CT Scans: A Statement from the [place] as published in Radiology 2005; 237:395-400. Online at: [email or web address].[email or web address]/. Electronically Signed   By: Marin Olp M.D.   On: 01/30/2015 20:43   Dg Abd 2 Views  01/14/2015  CLINICAL DATA:  Abdominal discomfort, psychiatric evaluation EXAM: ABDOMEN - 2 VIEW COMPARISON:  01/01/2015 FINDINGS: Nonobstructive bowel gas pattern. No evidence of free air under the diaphragm on the upright view. Degenerative changes of the lumbar spine. IMPRESSION: No evidence of small bowel obstruction or free air. Electronically Signed   By: Julian Hy M.D.   On: 01/14/2015 14:44   Dg Abd Acute W/chest  01/01/2015  CLINICAL DATA:  Bi suicidal ideation. EXAM: DG ABDOMEN ACUTE W/ 1V CHEST COMPARISON:  Radiograph 12/29/2014, CT 05/27/2014 FINDINGS: There is linear interstitial markings the upper lobes are slightly increased compared to prior. No pneumothorax. No fracture. No dilated loops  of large or small bowel. No organomegaly. No evidence of fracture. There is a densely sclerotic lesion in the RIGHT iliac bone not changed from prior. IMPRESSION: 1. Interstitial pattern suggests interstitial edema. 2. No evidence of bowel obstruction or free air. Electronically Signed   By: Suzy Bouchard M.D.   On: 01/01/2015 17:52    Assessment:   HELAMAN MECCA is a 49 y.o. male with hx of etoh liver disease, COPD, Poly substance abuse, esophageal stricture, Gerd  whom I saw in April 2015 due to ruptured appendix and candidemia.   He was readmitted 11/3-11/5 with chest pain, had neg stress test and was dced. 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 Readmitted 11/15 with continued chest pain. He states that he has not felt himself since his surgery a year and a half ago with chronic pain in abd and L side of chest. He follows with Dr Gwynneth Aliment at [facility]. He says only started eating reg food 3 months ago. Has not been losing wt but has night sweats, no fevers. He does have a cough but  no hemoptysis. Was in prison for 45 days in 2005 and had TB test but then was "cleared" - does not sound like he had treatment.  He has not had fever since admission, has nml wbc.  He certainly has copd with wheezing and rhonchi.  Has hx esophageal issues and etoh use so may be an aspiration risk.  Not convinced this is a high probability this is TB but certainly has risk factors. He is also apparently in a group home now which complicates the DC planning for TB suspects.    Recommendations Check HIV and QFG TB test Send sputum x 3 for AFB Treat COPD exacerbation. May need Bronch   Thank you very much for allowing me to participate in the care of this patient. Please call with questions.    P. , MD   

## 2015-02-01 MED ORDER — LORAZEPAM 2 MG/ML IJ SOLN
0.5000 mg | INTRAMUSCULAR | Status: DC | PRN
  Administered 2015-02-01 – 2015-02-02 (×3): 0.5 mg via INTRAVENOUS
  Filled 2015-02-01 (×3): qty 1

## 2015-02-01 MED ORDER — DOCUSATE SODIUM 100 MG PO CAPS
100.0000 mg | ORAL_CAPSULE | Freq: Two times a day (BID) | ORAL | Status: DC
  Administered 2015-02-01 – 2015-02-09 (×13): 100 mg via ORAL
  Filled 2015-02-01 (×15): qty 1

## 2015-02-01 MED ORDER — IPRATROPIUM-ALBUTEROL 0.5-2.5 (3) MG/3ML IN SOLN
3.0000 mL | Freq: Three times a day (TID) | RESPIRATORY_TRACT | Status: DC
  Administered 2015-02-02 – 2015-02-08 (×17): 3 mL via RESPIRATORY_TRACT
  Filled 2015-02-01 (×17): qty 3

## 2015-02-01 MED ORDER — DILTIAZEM HCL 30 MG PO TABS
60.0000 mg | ORAL_TABLET | Freq: Four times a day (QID) | ORAL | Status: DC
  Administered 2015-02-02 – 2015-02-09 (×29): 60 mg via ORAL
  Filled 2015-02-01 (×30): qty 2

## 2015-02-01 MED ORDER — METOPROLOL TARTRATE 1 MG/ML IV SOLN
INTRAVENOUS | Status: AC
  Administered 2015-02-01: 5 mg via INTRAVENOUS
  Filled 2015-02-01: qty 5

## 2015-02-01 MED ORDER — METOPROLOL TARTRATE 1 MG/ML IV SOLN
5.0000 mg | INTRAVENOUS | Status: DC | PRN
  Administered 2015-02-01: 5 mg via INTRAVENOUS

## 2015-02-01 NOTE — Progress Notes (Signed)
[facility] -  at [facility]   PATIENT NAME: [name]    MR#:  [id number]  DATE OF BIRTH:  1966-02-02  SUBJECTIVE:  CHIEF COMPLAINT:   Chief Complaint  Patient presents with  . Chest Pain   patient is a 49 year old Caucasian male with past medical history significant for history of ongoing tobacco abuse, gastric esophageal reflux disease, chronic pain, asthma who presents to the hospital with complaints of chest pains, shortness of breath, wheezing, cough, and a yellow green-looking phlegm. Because of his abnormal CT scan concerning for TB. He was placed in respiratory isolation, infectious disease specialist consultation was requested. Patient complains of chest and abdominal pains. Admits of cough. Pain worsens with cough Overall feels better today, cough has been subsiding,   Review of Systems  Constitutional: Negative for fever, chills and weight loss.  HENT: Negative for congestion.   Eyes: Negative for blurred vision and double vision.  Respiratory: Positive for cough, sputum production, shortness of breath and wheezing.   Cardiovascular: Negative for chest pain, palpitations, orthopnea, leg swelling and PND.  Gastrointestinal: Negative for nausea, vomiting, abdominal pain, diarrhea, constipation and blood in stool.  Genitourinary: Negative for dysuria, urgency, frequency and hematuria.  Musculoskeletal: Negative for falls.  Neurological: Negative for dizziness, tremors, focal weakness and headaches.  Endo/Heme/Allergies: Does not bruise/bleed easily.  Psychiatric/Behavioral: Negative for depression. The patient does not have insomnia.     VITAL SIGNS: Blood pressure 122/72, pulse 73, temperature 98.2 F (36.8 C), temperature source Oral, resp. rate 18, height  (1.702 m), weight 85.503 kg (188 lb 8 oz), SpO2 92 %.  PHYSICAL EXAMINATION:   GENERAL:  49 y.o.-year-old patient lying in the bed with no acute distress.  EYES: Pupils equal,  round, reactive to light and accommodation. No scleral icterus. Extraocular muscles intact.  HEENT: Head atraumatic, normocephalic. Oropharynx and nasopharynx clear.  NECK:  Supple, no jugular venous distention. No thyroid enlargement, no tenderness.  LUNGS: Better air entrance bilaterally, minimal wheezing, no significant rales,rhonchi or crepitation. Not using accessory muscles of respiration.  CARDIOVASCULAR: S1, S2 normal. No murmurs, rubs, or gallops. Tender chest on palpation diffusely ABDOMEN: Soft, diffusely tender to light touch, no rebound or guarding were noted, nondistended. Bowel sounds present. No organomegaly or mass.  EXTREMITIES: No pedal edema, cyanosis, or clubbing.  NEUROLOGIC: Cranial nerves II through XII are intact. Muscle strength 5/5 in all extremities. Sensation intact. Gait not checked.  PSYCHIATRIC: The patient is alert and oriented x 3.  SKIN: No obvious rash, lesion, or ulcer.   ORDERS/RESULTS REVIEWED:   CBC  Recent Labs Lab 01/30/15 2054 01/31/15 0636  WBC 10.3 8.4  HGB 14.0 13.4  HCT 41.8 39.8*  PLT 133* 118*  MCV 93.0 92.8  MCH 31.1 31.2  MCHC 33.4 33.7  RDW 15.5* 15.2*  LYMPHSABS 2.6  --   MONOABS 0.6  --   EOSABS 0.2  --   BASOSABS 0.1  --    ------------------------------------------------------------------------------------------------------------------  Chemistries   Recent Labs Lab 01/30/15 2054  NA 141  K 3.8  CL 104  CO2 30  GLUCOSE 158*  BUN 11  CREATININE 1.06  CALCIUM 9.0  AST 18  ALT 14*  ALKPHOS 80  BILITOT 0.3   ------------------------------------------------------------------------------------------------------------------ estimated creatinine clearance is 86.2 mL/min (by C-G formula based on Cr of 1.06). ------------------------------------------------------------------------------------------------------------------  Recent Labs  01/31/15 0636  TSH 1.531    Cardiac Enzymes  Recent Labs Lab  01/31/15 0636 01/31/15 1305 01/31/15 1531  TROPONINI <0.03 <0.03 <0.03   ------------------------------------------------------------------------------------------------------------------ Invalid input(s): POCBNP ---------------------------------------------------------------------------------------------------------------  RADIOLOGY: Ct Abdomen Pelvis W Contrast  01/30/2015  CLINICAL DATA:  Acute onset of left-sided chest pain and epigastric abdominal pain. Initial encounter. EXAM: CT ABDOMEN AND PELVIS WITH CONTRAST TECHNIQUE: Multidetector CT imaging of the abdomen and pelvis was performed using the standard protocol following bolus administration of intravenous contrast. CONTRAST:  100mL OMNIPAQUE IOHEXOL 300 MG/ML  SOLN COMPARISON:  CT of the abdomen and pelvis performed 05/27/2014 FINDINGS: Minimal bibasilar atelectasis is noted. The liver and spleen are unremarkable in appearance. The gallbladder is within normal limits. Minimal calcification within the pancreas may reflect sequelae of chronic pancreatitis. The pancreas and adrenal glands are otherwise unremarkable. The kidneys are unremarkable in appearance. There is no evidence of hydronephrosis. No renal or ureteral stones are seen. Mild nonspecific perinephric stranding is noted bilaterally. No free fluid is identified. The small bowel is unremarkable in appearance. The stomach is within normal limits. No acute vascular abnormalities are seen. Scattered calcification is noted along the abdominal aorta and its branches. The patient is status post appendectomy. Mild diverticulosis is noted along the proximal sigmoid colon, without evidence of diverticulitis. The bladder is mildly distended and grossly unremarkable. The prostate remains normal in size. No inguinal lymphadenopathy is seen. No acute osseous abnormalities are identified. IMPRESSION: 1. No acute abnormality seen within the abdomen or pelvis. 2. Minimal calcification within the  pancreas may reflect sequelae of chronic pancreatitis. 3. Scattered calcification along the abdominal aorta and its branches. 4. Mild diverticulosis along the proximal sigmoid colon, without evidence of diverticulitis. Electronically Signed   By: Roanna RaiderJeffery  Chang M.D.   On: 01/30/2015 22:31   Dg Chest Portable 1 View  01/30/2015  CLINICAL DATA:  Left-sided chest pain with anxiety and heartburn 3 days. EXAM: PORTABLE CHEST 1 VIEW COMPARISON:  01/18/2015, 05/20/2014 and chest CT 01/19/2015, CT 07/22/2013, 06/13/2013 FINDINGS: Lungs are adequately inflated without focal consolidation or effusion. There is continued bilateral interstitial prominence without significant change likely a chronic atypical infectious or inflammatory process. More discrete 8 mm nodule over the right midlung lateral to the hilum unchanged. Cardiomediastinal silhouette and remainder of the exam is unchanged. IMPRESSION: Chronic bilateral interstitial process likely an atypical infectious or inflammatory process. 8 mm nodular density lateral to the right hilum unchanged from CT 06/13/2013. Recommend 1 additional follow-up chest CT in 6 months to document 2 years of stability. This recommendation follows the consensus statement: Guidelines for Management of Small Pulmonary Nodules Detected on CT Scans: A Statement from the Fleischner Society as published in Radiology 2005; 237:395-400. Online at: [email or web address].[email or web address].um[place].edu/rad/res/Fleischner-nodule.htm. Electronically Signed   By: Elberta Fortisaniel  Boyle M.D.   On: 01/30/2015 20:43    EKG:  Orders placed or performed during the hospital encounter of 01/30/15  . ED EKG  . ED EKG    ASSESSMENT AND PLAN:  Active Problems:   Chest pain 1. COPD exacerbation due to bacterial pneumonia, continue patient on levofloxacin, steroids. Continue nebulizer therapy, improved clinically 2. Chest pain likely musculoskeletal related to cough, supportive therapy, Humibid and Tussionex, some better 3. Abnormal  CT scan concerning for TB, infectious disease consultation was requested and Dr. Sampson GoonFitzgerald recommended QuantiFERON, pending as well as H I V , negative, AFB sputum was recommended, but patient is not able to lift up phlegm, pulmonary involved for further recommendations 4. Bacterial pneumonia. Sputum cultures. Continue patient on levofloxacin, stable, better clinically 5. Hypotension , improved on IV fluids, decrease rate 6. Tobacco abuse. Counseling. Nicotine  replacement therapy initiated. Discussed this patient for approximate 5 minutes yesterday   Management plans discussed with the patient, family and they are in agreement.   DRUG ALLERGIES: No Known Allergies  CODE STATUS:     Code Status Orders        Start     Ordered   01/31/15 0343  Full code   Continuous     01/31/15 0342    Advance Directive Documentation        Most Recent Value   Type of Advance Directive  Living will   Pre-existing out of facility DNR order (yellow form or pink MOST form)     "MOST" Form in Place?        TOTAL TIME TAKING CARE OF THIS PATIENT: 35 minutes.    Katharina Caper M.D on 02/01/2015 at 5:08 PM  Between 7am to 6pm - Pager - [phone]  After 6pm go to [email or web address] - password [email or web address]  [place]  [facility]  Office  [phone]  CC: Primary care physician; Lanier Ensign KEY, MD

## 2015-02-01 NOTE — Progress Notes (Signed)
Discussed with oncoming nurse that heart rate is now 73 after iv metoprolol.

## 2015-02-01 NOTE — Plan of Care (Signed)
Problem: Physical Regulation: Goal: Ability to maintain clinical measurements within normal limits will improve Outcome: Progressing On isolation to r/o TB. Tussinex for cough with relief. SR on monitor. On iv fluids with K+. Continue to monitor.

## 2015-02-01 NOTE — Progress Notes (Signed)
Notified Dr. Imogene Burnhen via telephone that patient is not getting pain relief with morphine, MD will review orders

## 2015-02-01 NOTE — Progress Notes (Signed)
Patient ID: [name], male   DOB: 08/15/1965, 49 y.o.   MRN: [id number] Called for fast heart rate. Nursing staff gave metoprolol and his heart rate came down. EKG was just done a few minutes ago after his heart rate came down. I increased the Cardizem by mouth to 60 mg every 6.  Dr. Alford Highlandichard Longino Trefz

## 2015-02-01 NOTE — Progress Notes (Signed)
Notified Dr Renae GlossWieting via telephone that pt's heart rate has remained sustained in 140-150 for 15 minutes , no change in patient condition, pt reports chest pain as he has done all day that improves with morphine.MD to enter orders

## 2015-02-01 NOTE — Progress Notes (Signed)
Pt is alert, c/o chest pain treated with prn morphine, c/o anxiety relieved with ativan, up in room with stand by assist, bm throughout shift, using prn oxygen at 2 L, IV fluids infusing, rate decreased, receiving IV antibiotics, pulmonary consult ordered, still awaiting sputum culture. Afebrile throughout shift, vital signs stable.

## 2015-02-02 ENCOUNTER — Observation Stay: Payer: Medicaid Other

## 2015-02-02 LAB — PLATELET COUNT: Platelets: 145 10*3/uL — ABNORMAL LOW (ref 150–440)

## 2015-02-02 MED ORDER — LEVOFLOXACIN 500 MG PO TABS
500.0000 mg | ORAL_TABLET | Freq: Every day | ORAL | Status: DC
  Administered 2015-02-02 – 2015-02-07 (×6): 500 mg via ORAL
  Filled 2015-02-02 (×6): qty 1

## 2015-02-02 MED ORDER — DIAZEPAM 5 MG PO TABS
5.0000 mg | ORAL_TABLET | Freq: Four times a day (QID) | ORAL | Status: DC | PRN
  Administered 2015-02-02 – 2015-02-06 (×5): 5 mg via ORAL
  Filled 2015-02-02 (×4): qty 1

## 2015-02-02 NOTE — Progress Notes (Signed)
Date: 02/02/2015,   MRN# [id number] [name] 06/09/1965 Code Status:     Code Status Orders        Start     Ordered   01/31/15 0343  Full code   Continuous     01/31/15 0342    Advance Directive Documentation        Most Recent Value   Type of Advance Directive  Living will   Pre-existing out of facility DNR order (yellow form or pink MOST form)     "MOST" Form in Place?       Hosp day:@LENGTHOFSTAYDAYS @ Referring MD: @        AdmissionWeight: 188 lb (85.276 kg)                 CurrentWeight: 205 lb 8 oz (93.214 kg)  CC: Abnormal chest ct  HPI: This is a 49 yr old white mail, came in with chest pain, w/u in progres. A previous chest ct showed the following :   No evidence of significant pulmonary embolus.Enlarged calcified lymph nodes in the hila and mediastinum consistent with postinflammatory etiology. Calcified granulomas in the lungs. Knee reticular nodular interstitial changes suggest interstitial edema or interstitial pneumonia. Consider possible reactivation TB  Electronically Signed  By: Burman Nieves M.D.  On: 01/19/2015 01:13  Repeat chest ct pending. ID consulted. He denies weight loss, fever or sweats. No hemoptysis. Relate a past ppd skin test. He has been in jail. He is ina nursing home, disabled, history of multiple falls.   PMHX:   Past Medical History  Diagnosis Date  . Polysubstance abuse   . Bipolar disorder (HCC)   . Esophageal stricture   . H/O tonic-clonic seizures   . Anxiety   . GERD (gastroesophageal reflux disease)   . Chronic pain   . Asthma   . Hypertension    Surgical Hx:  Past Surgical History  Procedure Laterality Date  . Appendectomy      complicated by perforation and abscess  . Peg w/tracheostomy placement    . Peg tube removal    . Tracheostomy closure    . Esophagogastroduodenoscopy (egd) with esophageal dilation    . Hernia repair  Nov 2016    ventral hernia repair at [facility] by Dr. Genene Churn   Family Hx:   Family History  Problem Relation Age of Onset  . CAD Other   . Diabetes Other    Social Hx:   Social History  Substance Use Topics  . Smoking status: Current Every Day Smoker -- 1.00 packs/day    Types: Cigarettes  . Smokeless tobacco: None  . Alcohol Use: No   Medication:    Home Medication:  No current outpatient prescriptions on file.  Current Medication: @   Allergies:  Review of patient's allergies indicates no known allergies.  Review of Systems: Gen:  Denies  fever, sweats, chills HEENT: Denies blurred vision, double vision, ear pain, eye pain, hearing loss, nose bleeds, sore throat Cvc:  No dizziness, chest pain or heaviness Resp:    Gi: Denies swallowing difficulty, stomach pain, nausea or vomiting, diarrhea, constipation, bowel incontinence Gu:  Denies bladder incontinence, burning urine Ext:   No Joint pain, stiffness or swelling Skin: No skin rash, easy bruising or bleeding or hives Endoc:  No polyuria, polydipsia , polyphagia or weight change Psych: No depression, insomnia or hallucinations  Other:  All other systems negative  Physical Examination:   VS: BP 123/78 mmHg  Pulse 76  Temp(Src) 97.9 F (  36.6 C) (Oral)  Resp 20  Ht 5\' 7"  (1.702 m)  Wt 205 lb 8 oz (93.214 kg)  BMI 32.18 kg/m2  SpO2 93%  General Appearance: No distress  Neuro: without focal findings, mental status, speech normal, alert and oriented, cranial nerves 2-12 intact, reflexes normal and symmetric, sensation grossly normal  HEENT: PERRLA, EOM intact, no ptosis, no other lesions noticed, Mallampati: Pulmonary:.No wheezing, No rales  Sputum Production:   Cardiovascular:  Normal S1,S2.  No m/r/g.  Abdominal aorta pulsation normal.    Abdomen:Benign, Soft, non-tender, No masses, hepatosplenomegaly, No lymphadenopathy Endoc: No evident thyromegaly, no signs of acromegaly or Cushing features Skin:   warm, no rashes, no ecchymosis  Extremities: normal, no cyanosis, clubbing,  no edema, warm with normal capillary refill. Other findings:   Labs results:   Recent Labs     01/30/15  2054  01/31/15  0636  HGB  14.0  13.4  HCT  41.8  39.8*  MCV  93.0  92.8  WBC  10.3  8.4  BUN  11   --   CREATININE  1.06   --   GLUCOSE  158*   --   CALCIUM  9.0   --   ,    Rad results:  CLINICAL DATA: Left-sided chest pain with anxiety and heartburn 3 days.  EXAM: PORTABLE CHEST 1 VIEW  COMPARISON: 01/18/2015, 05/20/2014 and chest CT 01/19/2015, CT 07/22/2013, 06/13/2013  FINDINGS: Lungs are adequately inflated without focal consolidation or effusion. There is continued bilateral interstitial prominence without significant change likely a chronic atypical infectious or inflammatory process. More discrete 8 mm nodule over the right midlung lateral to the hilum unchanged. Cardiomediastinal silhouette and remainder of the exam is unchanged.  IMPRESSION: Chronic bilateral interstitial process likely an atypical infectious or inflammatory process.  8 mm nodular density lateral to the right hilum unchanged from CT 06/13/2013. Recommend 1 additional follow-up chest CT in 6 months to document 2 years of stability.  This recommendation follows the consensus statement: Guidelines for Management of Small Pulmonary Nodules Detected on CT Scans: A Statement from the Fleischner Society as published in Radiology 2005; 237:395-400. Online at: [email or web address]/rad/res/Fleischner-nodule.htm.   Electronically Signed  By: Elberta Fortisaniel Boyle M.D.  On: 01/30/2015 20:43  IMPRESSION: chest ct No evidence of significant pulmonary embolus.Enlarged calcified lymph nodes in the hila and mediastinum consistent with postinflammatory etiology. Calcified granulomas in the lungs. Knee reticular nodular interstitial changes suggest interstitial edema or interstitial pneumonia. Consider possible reactivation TB   Electronically Signed  By: Burman NievesWilliam Stevens M.D.   On: 01/19/2015 01:13   Assessment and Plan: Per above one must consider past  Tb infection, certainly old histoplasmosis infection or some other granulomatous condition. Are part of the differential  Agree with repeat chest ct guantiferon TB gold If possible sputum c/s, AFB Isolation for now Check histo ab, ace level,  Following        I have personally obtained a history, examined the patient, evaluated laboratory and imaging results, formulated the assessment and plan and placed orders.  The Patient requires high complexity decision making for assessment and support, frequent evaluation and titration of therapies, application of advanced monitoring technologies and extensive interpretation of multiple databases.   Herbon Fleming,M.D. Pulmonary & Critical care Medicine [facility]

## 2015-02-02 NOTE — Progress Notes (Signed)
Encouraged pt to wear O2 due to low O2 sat of 88% on Room air. Pt agreed to place O2. O2 sats on 2L [place] 92%

## 2015-02-02 NOTE — Plan of Care (Signed)
Problem: Safety: Goal: Ability to remain free from injury will improve Outcome: Progressing Pt remains free from falls.  Calls with bathroom needs, bed in lowest position, bed alarm on, call bell in reach.

## 2015-02-02 NOTE — NC FL2 (Signed)
[place] MEDICAID FL2 LEVEL OF CARE SCREENING TOOL     IDENTIFICATION  Patient Name: [name] Birthdate: 1966-02-20 Sex: male Admission Date (Current Location): 01/30/2015  [facility] and [place] Number:  [facility] )  ([phone] [id number]) Facility and Address:  [facility], [address] [place], [place] [address]      Provider Number: [id number]  Attending Physician Name and Address:  Suzanne Boron, MD  Relative Name and Phone Number:       Current Level of Care: Hospital Recommended Level of Care: [facility] Prior Approval Number:    Date Approved/Denied:   PASRR Number:  ( [id number] )  Discharge Plan: Other (Comment) (Group Home)    Current Diagnoses: Patient Active Problem List   Diagnosis Date Noted  . Chest pain 01/31/2015  . Left sided chest pain 01/19/2015  . Adjustment disorder with depressed mood 01/15/2015  . Dyslipidemia 01/03/2015  . Tobacco use disorder 01/03/2015  . Major depressive disorder, recurrent severe without psychotic features (HCC) 01/02/2015  . Hypertension 01/02/2015  . Gastric reflux 01/02/2015    Orientation ACTIVITIES/SOCIAL BLADDER RESPIRATION    Self, Time, Situation, Place  Active Continent O2 (As needed) (2 Liters oxygen )  BEHAVIORAL SYMPTOMS/MOOD NEUROLOGICAL BOWEL NUTRITION STATUS   (none )  (none ) Continent Diet (Diet: Heart Healthy )  PHYSICIAN VISITS COMMUNICATION OF NEEDS Height & Weight Skin  30 days Verbally  (170.2 cm) 205 lbs. Normal          AMBULATORY STATUS RESPIRATION    Supervision limited O2 (As needed) (2 Liters oxygen )      Personal Care Assistance Level of Assistance  Bathing, Feeding, Dressing Bathing Assistance: Independent Feeding assistance: Independent Dressing Assistance: Independent      Functional Limitations Info  Sight, Hearing, Speech Sight Info: Adequate Hearing Info: Adequate Speech Info: Adequate       SPECIAL CARE FACTORS FREQUENCY                       Additional Factors Info  Code Status, Psychotropic, Isolation Precautions Code Status Info:  (Full Code. )   Psychotropic Info:  (Valium )   Isolation Precautions Info:  (Airborine Precautions )     Medication List    STOP taking these medications       HYDROcodone-acetaminophen 5-325 MG tablet  Commonly known as: NORCO/VICODIN      TAKE these medications       albuterol 108 (90 BASE) MCG/ACT inhaler  Commonly known as: PROVENTIL HFA;VENTOLIN HFA  Inhale 2 puffs into the lungs every 6 (six) hours as needed for wheezing or shortness of breath.     atorvastatin 10 MG tablet  Commonly known as: LIPITOR  Take 10 mg by mouth every evening.     benzonatate 200 MG capsule  Commonly known as: TESSALON  Take 1 capsule (200 mg total) by mouth 3 (three) times daily as needed for cough.     busPIRone 15 MG tablet  Commonly known as: BUSPAR  Take 1 tablet (15 mg total) by mouth 3 (three) times daily.     clonazePAM 0.5 MG tablet  Commonly known as: KLONOPIN  Take 1 tablet (0.5 mg total) by mouth 3 (three) times daily as needed.     diltiazem 30 MG tablet  Commonly known as: CARDIZEM  Take 1 tablet (30 mg total) by mouth 4 (four) times daily.     FLUoxetine 20 MG capsule  Commonly known as:  PROZAC  Take 20 mg by mouth 2 (two) times daily.     fluticasone 50 MCG/ACT nasal spray  Commonly known as: FLONASE  Place 2 sprays into both nostrils daily.     gabapentin 300 MG capsule  Commonly known as: NEURONTIN  Take 1 capsule (300 mg total) by mouth 2 (two) times daily.     levofloxacin 500 MG tablet  Commonly known as: LEVAQUIN  Take 1 tablet (500 mg total) by mouth daily.     loratadine 10 MG tablet  Commonly known as: CLARITIN  Take 10 mg by mouth daily.     metaxalone 800 MG tablet  Commonly known as: SKELAXIN  Take 1 tablet (800 mg total) by mouth 3  (three) times daily.     metoprolol succinate 50 MG 24 hr tablet  Commonly known as: TOPROL-XL  Take 50 mg by mouth daily.     pantoprazole 40 MG tablet  Commonly known as: PROTONIX  Take 1 tablet (40 mg total) by mouth daily.     predniSONE 10 MG tablet  Commonly known as: DELTASONE  Take 1 tablet (10 mg total) by mouth daily with breakfast.  Start taking on: 02/10/2015     venlafaxine XR 150 MG 24 hr capsule  Commonly known as: EFFEXOR-XR  Take 1 capsule (150 mg total) by mouth daily with breakfast.           Additional Information  (SSN: [id number])  Haig ProphetMorgan, Bailey G, LCSW

## 2015-02-02 NOTE — Plan of Care (Signed)
Problem: Education: Goal: Knowledge of [place] [facility] Education information/materials will improve Outcome: Progressing Pt unable to cough up any sputum. Education provided on isolation. Pulmonary consult ordered.  Problem: Safety: Goal: Ability to remain free from injury will improve Outcome: Progressing No falls this shift. Patient calls out for needs. Patient felt anxious this morning. Lorazepam and ativan given PRN. Patient stated relief.

## 2015-02-02 NOTE — Progress Notes (Signed)
[facility] [place] at [facility]   PATIENT NAME: [name]    MR#:  [id number]  DATE OF BIRTH:  07/18/1965  SUBJECTIVE:  CHIEF COMPLAINT:   Chief Complaint  Patient presents with  . Chest Pain   patient is a 49 year old Caucasian male with past medical history significant for history of ongoing tobacco abuse, gastric esophageal reflux disease, chronic pain, asthma who presents to the hospital with complaints of chest pains, shortness of breath, wheezing, cough, and a yellow green-looking phlegm. Because of his abnormal CT scan concerning for TB. He was placed in respiratory isolation, infectious disease specialist consultation was requested. Patient complains of chest and abdominal pains. Admits of cough. Pain worsens with cough Overall feels better today, cough has been subsiding, unable to lift any sputum, not able to evaluate for acid-fast bacteria. Pulmonary consultation is requested. Tachycardic with duo nebs, requiring additional Cardizem doses  Review of Systems  Constitutional: Negative for fever, chills and weight loss.  HENT: Negative for congestion.   Eyes: Negative for blurred vision and double vision.  Respiratory: Positive for cough, sputum production, shortness of breath and wheezing.   Cardiovascular: Negative for chest pain, palpitations, orthopnea, leg swelling and PND.  Gastrointestinal: Negative for nausea, vomiting, abdominal pain, diarrhea, constipation and blood in stool.  Genitourinary: Negative for dysuria, urgency, frequency and hematuria.  Musculoskeletal: Negative for falls.  Neurological: Negative for dizziness, tremors, focal weakness and headaches.  Endo/Heme/Allergies: Does not bruise/bleed easily.  Psychiatric/Behavioral: Negative for depression. The patient does not have insomnia.     VITAL SIGNS: Blood pressure 123/78, pulse 76, temperature 97.9 F (36.6 C), temperature source Oral, resp. rate 20, height 5\' 7"  (1.702 m),  weight 93.214 kg (205 lb 8 oz), SpO2 93 %.  PHYSICAL EXAMINATION:   GENERAL:  [age] y.o.-year-old patient lying in the bed with no acute distress.  EYES: Pupils equal, round, reactive to light and accommodation. No scleral icterus. Extraocular muscles intact.  HEENT: Head atraumatic, normocephalic. Oropharynx and nasopharynx clear.  NECK:  Supple, no jugular venous distention. No thyroid enlargement, no tenderness.  LUNGS: Better air entrance bilaterally, minimal wheezing, no significant rales,rhonchi or crepitation. Not using accessory muscles of respiration.  CARDIOVASCULAR: S1, S2 normal. No murmurs, rubs, or gallops. Tender chest on palpation diffusely ABDOMEN: Soft, diffusely tender to light touch, no rebound or guarding were noted, nondistended. Bowel sounds present. No organomegaly or mass.  EXTREMITIES: No pedal edema, cyanosis, or clubbing.  NEUROLOGIC: Cranial nerves II through XII are intact. Muscle strength 5/5 in all extremities. Sensation intact. Gait not checked.  PSYCHIATRIC: The patient is alert and oriented x 3.  SKIN: No obvious rash, lesion, or ulcer.   ORDERS/RESULTS REVIEWED:   CBC  Recent Labs Lab 01/30/15 2054 01/31/15 0636 02/02/15 0655  WBC 10.3 8.4  --   HGB 14.0 13.4  --   HCT 41.8 39.8*  --   PLT 133* 118* 145*  MCV 93.0 92.8  --   MCH 31.1 31.2  --   MCHC 33.4 33.7  --   RDW 15.5* 15.2*  --   LYMPHSABS 2.6  --   --   MONOABS 0.6  --   --   EOSABS 0.2  --   --   BASOSABS 0.1  --   --    ------------------------------------------------------------------------------------------------------------------  Chemistries   Recent Labs Lab 01/30/15 2054  NA 141  K 3.8  CL 104  CO2 30  GLUCOSE 158*  BUN 11  CREATININE 1.06  CALCIUM 9.0  AST 18  ALT 14*  ALKPHOS 80  BILITOT 0.3   ------------------------------------------------------------------------------------------------------------------ estimated creatinine clearance is 89.7 mL/min (by  C-G formula based on Cr of 1.06). ------------------------------------------------------------------------------------------------------------------  Recent Labs  01/31/15 0636  TSH 1.531    Cardiac Enzymes  Recent Labs Lab 01/31/15 0636 01/31/15 1305 01/31/15 1531  TROPONINI <0.03 <0.03 <0.03   ------------------------------------------------------------------------------------------------------------------ Invalid input(s): POCBNP ---------------------------------------------------------------------------------------------------------------  RADIOLOGY: No results found.  EKG:  Orders placed or performed during the hospital encounter of 01/30/15  . ED EKG  . ED EKG  . EKG 12-Lead  . EKG 12-Lead    ASSESSMENT AND PLAN:  Active Problems:   Chest pain 1. COPD exacerbation due to bacterial pneumonia, continue patient on levofloxacin, steroids. Continue nebulizer therapy, improving slowly clinically 2. Chest pain likely musculoskeletal related to cough, supportive therapy, Humibid and Tussionex, some improved. Not likely cardiac. Since cardiac enzymes are negative and pain has been continuous for a few days, also reproducible on pressure 3. Abnormal CT scan concerning for TB, infectious disease consultation was requested and Dr. Sampson Goon recommended QuantiFERON, pending as well as H I V , negative, AFB sputum was recommended, but patient is not able to lift up phlegm, pulmonary involved for further recommendations, questionable bronchoscopy 4. Bacterial pneumonia. Unable to obtain Sputum cultures. Continue patient on levofloxacin,  better clinically 5. Hypotension , improved on IV fluids, discontinue  6. Tobacco abuse. Counseling. Nicotine replacement therapy initiated. Discussed this patient for approximate 5 minutes  7. Tachycardia with hypotension episode. On arrival to the hospital, thought to be due to infection, rule out PE. Get VQ scan, now on metoprolol and Cardizem.     Management plans discussed with the patient, family and they are in agreement.   DRUG ALLERGIES: No Known Allergies  CODE STATUS:     Code Status Orders        Start     Ordered   01/31/15 0343  Full code   Continuous     01/31/15 0342    Advance Directive Documentation        Most Recent Value   Type of Advance Directive  Living will   Pre-existing out of facility DNR order (yellow form or pink MOST form)     "MOST" Form in Place?        TOTAL TIME TAKING CARE OF THIS PATIENT: 40 minutes.    Katharina Caper M.D on 02/02/2015 at 3:36 PM  Between 7am to 6pm - Pager - [phone]  After 6pm go to [email or web address] - password [email or web address] [facility]  [place] [facility]  Office  [phone]  CC: Primary care physician; Lanier Ensign KEY, MD

## 2015-02-02 NOTE — Consult Note (Signed)
[facility] CLINIC INFECTIOUS DISEASE PROGRESS NOTE Date of Admission:  01/30/2015     ID: [name] is a 49 y.o. male with COPD, PNA and possible TB  Active Problems:   Chest pain   Subjective: No fevers since admit. Improving cough  ROS  Eleven systems are reviewed and negative except per hpi  Medications:  Antibiotics Given (last 72 hours)    Date/Time Action Medication Dose Rate   01/31/15 1217 Given   levofloxacin (LEVAQUIN) IVPB 500 mg 500 mg 100 mL/hr   02/01/15 1234 Given   levofloxacin (LEVAQUIN) IVPB 500 mg 500 mg 100 mL/hr   02/02/15 1123 Given   levofloxacin (LEVAQUIN) tablet 500 mg 500 mg      . aspirin EC  81 mg Oral Daily  . atorvastatin  10 mg Oral q1800  . busPIRone  15 mg Oral TID  . chlorpheniramine-HYDROcodone  5 mL Oral Q12H  . clotrimazole   Topical BID  . diltiazem  60 mg Oral 4 times per day  . docusate sodium  100 mg Oral BID  . enoxaparin (LOVENOX) injection  40 mg Subcutaneous Q24H  . famotidine  20 mg Oral BID  . FLUoxetine  20 mg Oral BID  . gabapentin  300 mg Oral BID  . guaiFENesin  600 mg Oral BID  . ipratropium-albuterol  3 mL Nebulization TID  . levofloxacin  500 mg Oral Daily  . methylPREDNISolone (SOLU-MEDROL) injection  60 mg Intravenous Q24H  . metoprolol succinate  50 mg Oral Daily  . nicotine  14 mg Transdermal Daily  . sodium chloride  3 mL Intravenous Q12H  . venlafaxine XR  150 mg Oral Q breakfast    Objective: Vital signs in last 24 hours: Temp:  [97.6 F (36.4 C)-97.9 F (36.6 C)] 97.9 F (36.6 C) (11/18 1457) Pulse Rate:  [74-155] 76 (11/18 1457) Resp:  [18-20] 20 (11/18 1457) BP: (115-126)/(68-84) 123/78 mmHg (11/18 1457) SpO2:  [92 %-97 %] 93 % (11/18 1457) Weight:  [93.214 kg (205 lb 8 oz)] 93.214 kg (205 lb 8 oz) (11/18 1610) Constitutional: He is oriented to person, place, and time. Obese HENT: PERRLA Mouth/Throat: Oropharynx is clear and moist. No thrush No oropharyngeal exudate.  Cardiovascular: Normal  rate, regular rhythm and normal heart sounds.  Pulmonary/Chest: rhonchi and wheeze throughout Abdominal: Soft but somewhat distended. Bowel sounds are normal. He exhibits no distension. There is mild diffuse tenderness Lymphadenopathy: He has no cervical adenopathy.  Neurological: He is alert and oriented to person, place, and time.  Skin: Skin is warm and dry. No rash noted. No erythema.  Psychiatric: He has a normal mood and affect. His behavior is normal.    Lab Results  Recent Labs  01/30/15 2054 01/31/15 0636  WBC 10.3 8.4  HGB 14.0 13.4  HCT 41.8 39.8*  NA 141  --   K 3.8  --   CL 104  --   CO2 30  --   BUN 11  --   CREATININE 1.06  --     Microbiology: Results for orders placed or performed during the hospital encounter of 01/30/15  MRSA PCR Screening     Status: None   Collection Time: 01/31/15  4:30 AM  Result Value Ref Range Status   MRSA by PCR NEGATIVE NEGATIVE Final    Comment:        The GeneXpert MRSA Assay (FDA approved for NASAL specimens only), is one component of a comprehensive MRSA colonization surveillance program. It is  not intended to diagnose MRSA infection nor to guide or monitor treatment for MRSA infections.     Studies/Results:  Dg Chest 2 View  01/18/2015  CLINICAL DATA:  Chest pain and anxiety attacks. Shortness of breath and cough. EXAM: CHEST  2 VIEW COMPARISON:  01/01/2015 FINDINGS: Normal heart size and pulmonary vascularity. Diffuse interstitial pattern in the lungs may represent chronic fibrosis or acute interstitial edema or acute interstitial pneumonitis. Appearance is similar to previous study. No developing consolidation or airspace disease. No blunting of costophrenic angles. No pneumothorax. Mild hyperinflation suggesting emphysema. Degenerative changes in the spine. Mediastinal contours appear intact. Calcified granulomas in the upper lungs. IMPRESSION: Nonspecific interstitial pattern to the lungs may indicate chronic  fibrosis or acute edema/pneumonitis. Emphysematous changes. No acute consolidation. Electronically Signed   By: Burman Nieves M.D.   On: 01/18/2015 20:39   Ct Angio Chest Pe W/cm &/or Wo Cm  01/19/2015  CLINICAL DATA:  Left-sided chest pain, headache, and blurred vision. Nausea and abdominal pain. EXAM: CT ANGIOGRAPHY CHEST WITH CONTRAST TECHNIQUE: Multidetector CT imaging of the chest was performed using the standard protocol during bolus administration of intravenous contrast. Multiplanar CT image reconstructions and MIPs were obtained to evaluate the vascular anatomy. CONTRAST:  OMNIPAQUE IOHEXOL 350 MG/ML SOLN COMPARISON:  06/24/2013 FINDINGS: Technically adequate study with good opacification of the central and segmental pulmonary arteries. No focal filling defects demonstrated. No evidence of significant pulmonary embolus. Normal heart size. Normal caliber thoracic aorta. No aortic dissection. Great vessel origins are patent. Enlarged lymph nodes demonstrated throughout the mediastinum and in both hila. Largest right paratracheal lymph node measures about 1.8 cm short axis dimension. Subcarinal lymph nodes measure up to 2.2 cm short axis dimension. Scattered calcifications are demonstrated throughout the lymph nodes. This suggest probable postinflammatory change. Lymphadenopathy was present on the previous study is well. Esophagus is decompressed. Calcified granulomas in the lungs. Fine reticular nodular pattern to the lungs is new since previous study. This could indicate interstitial edema or interstitial pneumonitis. Given presence of calcifications, consider possible reactivation miliary TB. No focal consolidation. No pleural effusions. No pneumothorax. Included portions of the upper abdominal organs are grossly unremarkable. Degenerative changes in the spine. No destructive bone lesions. Review of the MIP images confirms the above findings. IMPRESSION: Enlarged calcified lymph nodes in the  hila and mediastinum consistent with postinflammatory etiology. Calcified granulomas in the lungs. Knee reticular nodular interstitial changes suggest interstitial edema or interstitial pneumonia. Consider possible reactivation TB. No evidence of significant pulmonary embolus. Electronically Signed   By: Burman Nieves M.D.   On: 01/19/2015 01:13   Ct Abdomen Pelvis W Contrast  01/30/2015  CLINICAL DATA:  Acute onset of left-sided chest pain and epigastric abdominal pain. Initial encounter. EXAM: CT ABDOMEN AND PELVIS WITH CONTRAST TECHNIQUE: Multidetector CT imaging of the abdomen and pelvis was performed using the standard protocol following bolus administration of intravenous contrast. CONTRAST:  OMNIPAQUE IOHEXOL 300 MG/ML  SOLN COMPARISON:  CT of the abdomen and pelvis performed 05/27/2014 FINDINGS: Minimal bibasilar atelectasis is noted. The liver and spleen are unremarkable in appearance. The gallbladder is within normal limits. Minimal calcification within the pancreas may reflect sequelae of chronic pancreatitis. The pancreas and adrenal glands are otherwise unremarkable. The kidneys are unremarkable in appearance. There is no evidence of hydronephrosis. No renal or ureteral stones are seen. Mild nonspecific perinephric stranding is noted bilaterally. No free fluid is identified. The small bowel is unremarkable in appearance. The stomach is within normal  limits. No acute vascular abnormalities are seen. Scattered calcification is noted along the abdominal aorta and its branches. The patient is status post appendectomy. Mild diverticulosis is noted along the proximal sigmoid colon, without evidence of diverticulitis. The bladder is mildly distended and grossly unremarkable. The prostate remains normal in size. No inguinal lymphadenopathy is seen. No acute osseous abnormalities are identified. IMPRESSION: 1. No acute abnormality seen within the abdomen or pelvis. 2. Minimal calcification within  the pancreas may reflect sequelae of chronic pancreatitis. 3. Scattered calcification along the abdominal aorta and its branches. 4. Mild diverticulosis along the proximal sigmoid colon, without evidence of diverticulitis. Electronically Signed   By: Roanna RaiderJeffery  Chang M.D.   On: 01/30/2015 22:31   Nm Myocar Multi W/spect W/wall Motion / Ef  01/20/2015   There was no ST segment deviation noted during stress.  The study is normal.  This is a low risk study.  The left ventricular ejection fraction is normal (55-65%).  Normal sestamibi. Normal ef with no evidence of ischemia   Dg Chest Portable 1 View  01/30/2015  CLINICAL DATA:  Left-sided chest pain with anxiety and heartburn 3 days. EXAM: PORTABLE CHEST 1 VIEW COMPARISON:  01/18/2015, 05/20/2014 and chest CT 01/19/2015, CT 07/22/2013, 06/13/2013 FINDINGS: Lungs are adequately inflated without focal consolidation or effusion. There is continued bilateral interstitial prominence without significant change likely a chronic atypical infectious or inflammatory process. More discrete 8 mm nodule over the right midlung lateral to the hilum unchanged. Cardiomediastinal silhouette and remainder of the exam is unchanged. IMPRESSION: Chronic bilateral interstitial process likely an atypical infectious or inflammatory process. 8 mm nodular density lateral to the right hilum unchanged from CT 06/13/2013. Recommend 1 additional follow-up chest CT in 6 months to document 2 years of stability. This recommendation follows the consensus statement: Guidelines for Management of Small Pulmonary Nodules Detected on CT Scans: A Statement from the Fleischner Society as published in Radiology 2005; 237:395-400. Online at: [email or web address].[employer].edu/rad/res/Fleischner-nodule.htm. Electronically Signed   By: Elberta Fortisaniel  Boyle M.D.   On: 01/30/2015 20:43   Dg Abd 2 Views  01/14/2015  CLINICAL DATA:  Abdominal discomfort, psychiatric evaluation EXAM: ABDOMEN - 2 VIEW COMPARISON:   01/01/2015 FINDINGS: Nonobstructive bowel gas pattern. No evidence of free air under the diaphragm on the upright view. Degenerative changes of the lumbar spine. IMPRESSION: No evidence of small bowel obstruction or free air. Electronically Signed   By: Charline BillsSriyesh  Krishnan M.D.   On: 01/14/2015 14:44    Assessment/Plan: Justin Holmimothy L Moger is a 49 y.o. male with hx of etoh liver disease, COPD, Poly substance abuse, esophageal stricture, Gerd whom I saw in April 2015 due to ruptured appendix and candidemia.  He was readmitted 11/3-11/5 with chest pain, had neg stress test and was dced. 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 Readmitted 11/15 with continued chest pain. He states that he has not felt himself since his surgery a year and a half ago with chronic pain in abd and L side of chest. [facility]. He says only started eating reg food 3 months ago. Has not been losing wt but has night sweats, no fevers. He does have a cough but no hemoptysis. Was in prison for 45 days in 2005 and had TB test but then was "cleared" - does not sound like he had treatment. He has not had fever since admission, has nml wbc. HIV negative  He certainly has copd with  wheezing and rhonchi. Has hx esophageal issues and etoh use so may be an aspiration risk. Not convinced this is a high probability this is TB but certainly has risk factors. He is also apparently in a group home now which complicates the DC planning for TB suspects. Pending QFG  Recommendations If he was not in a group home setting I would suggest TB wu can be done as otpt once over the current COPD exacerbation.   He states he is on home O2 FOr now would repeat non contrast CT chest to see if any  improvement in the fine nodular intestitial infiltrate  Send sputum x 3 for AFB Treat COPD exacerbation. May need Bronch if CT shows progression or sputum cannot be produced.  Thank  you very much for the consult. Will follow with you.  Ciro Tashiro   02/02/2015, 4:13 PM

## 2015-02-03 ENCOUNTER — Observation Stay: Payer: Medicaid Other

## 2015-02-03 MED ORDER — IBUPROFEN 400 MG PO TABS: 400.0000 mg | ORAL_TABLET | Freq: Four times a day (QID) | ORAL | Status: DC | PRN

## 2015-02-03 MED ORDER — TECHNETIUM TO 99M ALBUMIN AGGREGATED
4.0000 | Freq: Once | INTRAVENOUS | Status: AC | PRN
  Administered 2015-02-03: 4.113 via INTRAVENOUS

## 2015-02-03 MED ORDER — ALPRAZOLAM 0.5 MG PO TABS
0.5000 mg | ORAL_TABLET | Freq: Three times a day (TID) | ORAL | Status: DC
  Administered 2015-02-03 – 2015-02-09 (×17): 0.5 mg via ORAL
  Filled 2015-02-03 (×17): qty 1

## 2015-02-03 MED ORDER — TECHNETIUM TC 99M DIETHYLENETRIAME-PENTAACETIC ACID
40.0000 | Freq: Once | INTRAVENOUS | Status: DC | PRN
  Administered 2015-02-03: 32.77 via INTRAVENOUS
  Filled 2015-02-03: qty 40

## 2015-02-03 NOTE — Care Management (Signed)
Please note that patient is MEDICAID which will not cover HHPT but it WILL cover skilled nursing [facility](HHRN).

## 2015-02-03 NOTE — Progress Notes (Signed)
[facility] - [place] at [facility]   PATIENT NAME: [name]    MR#:  [id number]  DATE OF BIRTH:  1965-11-11  SUBJECTIVE:  CHIEF COMPLAINT:   Chief Complaint  Patient presents with  . Chest Pain    Patient continues to complain of some chest pain with coughing as well as abdominal pain. He reports that he is feeling very anxious and shaky because of that.    Review of Systems  Constitutional: Negative for fever, chills and weight loss.  HENT: Negative for congestion.   Eyes: Negative for blurred vision and double vision.  Respiratory: Positive for cough, sputum production, shortness of breath and wheezing.   Cardiovascular: Positive chest pain, palpitations, orthopnea, leg swelling and PND.  Gastrointestinal: Negative for nausea, vomiting, positive abdominal pain, no diarrhea, or constipation and blood in stool.  Genitourinary: Negative for dysuria, urgency, frequency and hematuria.  Musculoskeletal: Negative for falls.  Neurological: Negative for dizziness, tremors, focal weakness and headaches.  Endo/Heme/Allergies: Does not bruise/bleed easily.  Psychiatric/Behavioral: Negative for depression. The patient does not have insomnia.   positive for anxiety   VITAL SIGNS: Blood pressure 121/77, pulse 91, temperature 98.2 F (36.8 C), temperature source Oral, resp. rate 18, height  (1.702 m), weight 94.938 kg (209 lb 4.8 oz), SpO2 97 %.  PHYSICAL EXAMINATION:   GENERAL:  49 y.o.-year-old patient lying in the bed with no acute distress.  EYES: Pupils equal, round, reactive to light and accommodation. No scleral icterus. Extraocular muscles intact.  HEENT: Head atraumatic, normocephalic. Oropharynx and nasopharynx clear.  NECK:  Supple, no jugular venous distention. No thyroid enlargement, no tenderness.  LUNGS: Better air entrance bilaterally, minimal wheezing, no significant rales,rhonchi or crepitation. Not using accessory muscles of respiration.   CARDIOVASCULAR: S1, S2 normal. No murmurs, rubs, or gallops. Tender chest on palpation diffusely ABDOMEN: Soft, diffusely tender to light touch, no rebound or guarding were noted, nondistended. Bowel sounds present. No organomegaly or mass.  EXTREMITIES: No pedal edema, cyanosis, or clubbing.  NEUROLOGIC: Cranial nerves II through XII are intact. Muscle strength 5/5 in all extremities. Sensation intact. Gait not checked.  PSYCHIATRIC: The patient is alert and oriented x 3.  SKIN: No obvious rash, lesion, or ulcer.   ORDERS/RESULTS REVIEWED:   CBC  Recent Labs Lab 01/30/15 2054 01/31/15 0636 02/02/15 0655  WBC 10.3 8.4  --   HGB 14.0 13.4  --   HCT 41.8 39.8*  --   PLT 133* 118* 145*  MCV 93.0 92.8  --   MCH 31.1 31.2  --   MCHC 33.4 33.7  --   RDW 15.5* 15.2*  --   LYMPHSABS 2.6  --   --   MONOABS 0.6  --   --   EOSABS 0.2  --   --   BASOSABS 0.1  --   --    ------------------------------------------------------------------------------------------------------------------  Chemistries   Recent Labs Lab 01/30/15 2054  NA 141  K 3.8  CL 104  CO2 30  GLUCOSE 158*  BUN 11  CREATININE 1.06  CALCIUM 9.0  AST 18  ALT 14*  ALKPHOS 80  BILITOT 0.3   ------------------------------------------------------------------------------------------------------------------ estimated creatinine clearance is 90.5 mL/min (by C-G formula based on Cr of 1.06). ------------------------------------------------------------------------------------------------------------------ No results for input(s): TSH, T4TOTAL, T3FREE, THYROIDAB in the last 72 hours.  Invalid input(s): FREET3  Cardiac Enzymes  Recent Labs Lab 01/31/15 0636 01/31/15 1305 01/31/15 1531  TROPONINI <0.03 <0.03 <0.03   ------------------------------------------------------------------------------------------------------------------ Invalid input(s):  POCBNP ---------------------------------------------------------------------------------------------------------------  RADIOLOGY: Ct Chest Wo Contrast  02/02/2015  CLINICAL DATA:  49 year old male with history of cirrhosis. Question miliary pulmonary disease on prior CT.Has not been losing wt but has night sweats, no fevers. He does have a cough but no hemoptysis. EXAM: CT CHEST WITHOUT CONTRAST TECHNIQUE: Multidetector CT imaging of the chest was performed following the standard protocol without IV contrast. COMPARISON:  CT 01/19/2015 FINDINGS: Mediastinum/Nodes: No axillary supraclavicular lymphadenopathy. Calcified mediastinal lymph nodes not changed from prior. Largest lymph node is a subcarinal lymph node measuring 20 mm short axis. No pericardial fluid. Esophagus is normal. Lungs/Pleura: The fine nodular pattern in the upper lobes is again demonstrated not significantly changed from CT of 01/19/2015. No new nodularity. No new airspace disease, effusion, or infiltrate. Central airways are normal. Upper abdomen: Limited view of the liver, kidneys, pancreas are unremarkable. Normal adrenal glands. Musculoskeletal: No aggressive osseous lesion. IMPRESSION: 1. No significant change in fine nodular pattern in the upper lobes compared to CT of 01/19/2015. Differential remains atypical infectious process versus chronic granulomas disease. 2. Stable calcified mediastinal lymph nodes. Electronically Signed   By: Genevive BiStewart  Edmunds M.D.   On: 02/02/2015 17:25   Nm Pulmonary Perf And Vent  02/03/2015  CLINICAL DATA:  Chest pain and shortness of breath. EXAM: NUCLEAR MEDICINE VENTILATION - PERFUSION LUNG SCAN TECHNIQUE: Ventilation images were obtained in multiple projections using inhaled aerosol Tc-6668m DTPA. Perfusion images were obtained in multiple projections after intravenous injection of Tc-6568m MAA. RADIOPHARMACEUTICALS:  32.77 Technetium-1868m DTPA aerosol inhalation and 4.113 Technetium-2568m MAA IV  COMPARISON:  Chest CT 02/02/2015 and chest radiograph 01/30/2015 FINDINGS: Ventilation: Extensive clumping of the radiopharmaceutical throughout both lungs with a patchy distribution. Perfusion: Scattered perfusion defects in both lungs. Most of the perfusion defects match with ventilation defects. There is concern for mismatches in the hilar regions bilaterally. Mismatches in the hilar regions could be related to clumping of the radiopharmaceutical on the ventilation images. The more peripheral perfusion abnormalities are matched defects. IMPRESSION: Multiple matched defects throughout both lungs. There are mismatches involving the central aspects of both lungs but this could be secondary to clumping on the ventilation images. Intermediate probability for pulmonary embolism. Consider further evaluation with a chest CTA or lower extremity venous duplex to evaluate for venous thromboembolic disease. Electronically Signed   By: Richarda OverlieAdam  Henn M.D.   On: 02/03/2015 11:32    EKG:  Orders placed or performed during the hospital encounter of 01/30/15  . ED EKG  . ED EKG  . EKG 12-Lead  . EKG 12-Lead    ASSESSMENT AND PLAN:  Active Problems:   Chest pain 1. COPD exacerbation due to bacterial pneumonia, continue patient on levofloxacin, steroids. Continue nebulizer therapy, improving slowly clinically 2. Chest pain likely musculoskeletal related to cough, supportive therapy, Humibid and Tussionex, some improved. Place him on some anti-inflammatories scheduled for the next 24 hours 3. Abnormal CT scan concerning for TB, infectious disease consultation was requested and Dr. Sampson GoonFitzgerald recommended QuantiFERON, pending, HIV negative, negative, AFB sputum was recommended, but patient not able to cough up any sputum repeat CT scan of the chest shows no significant changes  4. Bacterial pneumonia. Unable to obtain Sputum cultures. Continue patient on levofloxacin,  better clinically 5. Hypotension , improved on IV  fluids now resolved 6. Tobacco abuse. Counseling. Nicotine replacement therapy 7. Tachycardia with hypotension episode. 8. Abnormal V/Q likely related to CT scan changes noted on CT however due to intermediate V/Q I'll go ahead and Doppler his lower extremity 9.  Anxiety we'll start him on Xanax  Management plans discussed with the patient, family and they are in agreement.   DRUG ALLERGIES: No Known Allergies  CODE STATUS:     Code Status Orders        Start     Ordered   01/31/15 0343  Full code   Continuous     01/31/15 0342    Advance Directive Documentation        Most Recent Value   Type of Advance Directive  Living will   Pre-existing out of facility DNR order (yellow form or pink MOST form)     "MOST" Form in Place?        TOTAL TIME TAKING CARE OF THIS PATIENT: .    Auburn Bilberry M.D on 02/03/2015 at 1:31 PM  Between 7am to 6pm - Pager - [phone]  After 6pm go to [email or web address] - password [email or web address] [facility]  [facility]  Office  [phone]  CC: Primary care physician; Lanier Ensign KEY, MD

## 2015-02-03 NOTE — Plan of Care (Signed)
Problem: Education: Goal: Knowledge of [facility] Education information/materials will improve Outcome: Progressing VQ scan completed today. Xanax scheduled TID. Patient unable to cough up sputum.  U/S of legs done.  Problem: Safety: Goal: Ability to remain free from injury will improve Outcome: Progressing Standby assist. Calls for assistance.  Problem: Physical Regulation: Goal: Ability to maintain clinical measurements within normal limits will improve Outcome: Progressing Patient encouraged to move and sit in chair.

## 2015-02-04 LAB — QUANTIFERON IN TUBE
QFT TB AG MINUS NIL VALUE: <0 IU/mL
QUANTIFERON MITOGEN VALUE: 0.24 IU/mL
QUANTIFERON TB AG VALUE: 0.03 IU/mL
QUANTIFERON TB GOLD: UNDETERMINED
Quantiferon Nil Value: 0.04 IU/mL

## 2015-02-04 LAB — QUANTIFERON TB GOLD ASSAY (BLOOD)

## 2015-02-04 LAB — CREATININE, SERUM: CREATININE: 0.82 mg/dL (ref 0.61–1.24)

## 2015-02-04 MED ORDER — OXYCODONE-ACETAMINOPHEN 5-325 MG PO TABS
1.0000 | ORAL_TABLET | Freq: Four times a day (QID) | ORAL | Status: DC | PRN
  Administered 2015-02-05 – 2015-02-07 (×4): 1 via ORAL
  Filled 2015-02-04 (×4): qty 1

## 2015-02-04 MED ORDER — ACETYLCYSTEINE 20 % IN SOLN
4.0000 mL | Freq: Two times a day (BID) | RESPIRATORY_TRACT | Status: DC
  Administered 2015-02-04: 4 mL via RESPIRATORY_TRACT
  Filled 2015-02-04: qty 4

## 2015-02-04 NOTE — Plan of Care (Signed)
Problem: Education: Goal: Knowledge of [facility] Education information/materials will improve Outcome: Progressing Patient education provided concerning High fall risk. Pt verbalized understanding to call for assistance when needed.  Problem: Safety: Goal: Ability to remain free from injury will improve Outcome: Progressing High fall risk with bed alarm activated. Pt has remained free of injury or fall this shift.  Problem: Physical Regulation: Goal: Ability to maintain clinical measurements within normal limits will improve Outcome: Progressing Patient rested well this shift, no c/o pain or discomfort. Vitals stable.

## 2015-02-04 NOTE — Progress Notes (Signed)
Patient had no c/o pain this shift VSS Patient continues on Airborne precautions  Patient is a standby assist and calls for assistance Patient is unable to cough up sputum

## 2015-02-04 NOTE — Progress Notes (Signed)
[facility] - [place] at [facility]   PATIENT NAME: [name]    MR#:  [id number]  DATE OF BIRTH:  [birth year]  SUBJECTIVE:  CHIEF COMPLAINT:   Chief Complaint  Patient presents with  . Chest Pain    Complains of continued pain in his chest and abdomen with coughing.   Review of Systems  Constitutional: Negative for fever, chills and weight loss.  HENT: Negative for congestion.   Eyes: Negative for blurred vision and double vision.  Respiratory: Positive for cough, sputum production, shortness of breath and wheezing.   Cardiovascular: Positive chest pain, palpitations, orthopnea, leg swelling and PND.  Gastrointestinal: Negative for nausea, vomiting, positive abdominal pain, no diarrhea, or constipation and blood in stool.  Genitourinary: Negative for dysuria, urgency, frequency and hematuria.  Musculoskeletal: Negative for falls.  Neurological: Negative for dizziness, tremors, focal weakness and headaches.  Endo/Heme/Allergies: Does not bruise/bleed easily.  Psychiatric/Behavioral: Negative for depression. The patient does not have insomnia.   positive for anxiety   VITAL SIGNS: Blood pressure 124/76, pulse 72, temperature 98.3 F (36.8 C), temperature source Oral, resp. rate 18, height  (1.702 m), weight 90.992 kg (200 lb 9.6 oz), SpO2 93 %.  PHYSICAL EXAMINATION:   GENERAL:  49 y.o.-year-old patient lying in the bed with no acute distress.  EYES: Pupils equal, round, reactive to light and accommodation. No scleral icterus. Extraocular muscles intact.  HEENT: Head atraumatic, normocephalic. Oropharynx and nasopharynx clear.  NECK:  Supple, no jugular venous distention. No thyroid enlargement, no tenderness.  LUNGS: Better air entrance bilaterally, minimal wheezing, no significant rales,rhonchi or crepitation. Not using accessory muscles of respiration.  CARDIOVASCULAR: S1, S2 normal. No murmurs, rubs, or gallops. Tender chest on palpation  diffusely ABDOMEN: Soft, diffusely tender to light touch, no rebound or guarding were noted, nondistended. Bowel sounds present. No organomegaly or mass.  EXTREMITIES: No pedal edema, cyanosis, or clubbing.  NEUROLOGIC: Cranial nerves II through XII are intact. Muscle strength 5/5 in all extremities. Sensation intact. Gait not checked.  PSYCHIATRIC: The patient is alert and oriented x 3.  SKIN: No obvious rash, lesion, or ulcer.   ORDERS/RESULTS REVIEWED:   CBC  Recent Labs Lab 01/30/15 2054 01/31/15 0636 02/02/15 0655  WBC 10.3 8.4  --   HGB 14.0 13.4  --   HCT 41.8 39.8*  --   PLT 133* 118* 145*  MCV 93.0 92.8  --   MCH 31.1 31.2  --   MCHC 33.4 33.7  --   RDW 15.5* 15.2*  --   LYMPHSABS 2.6  --   --   MONOABS 0.6  --   --   EOSABS 0.2  --   --   BASOSABS 0.1  --   --    ------------------------------------------------------------------------------------------------------------------  Chemistries   Recent Labs Lab 01/30/15 2054 02/04/15 0347  NA 141  --   K 3.8  --   CL 104  --   CO2 30  --   GLUCOSE 158*  --   BUN 11  --   CREATININE 1.06 0.82  CALCIUM 9.0  --   AST 18  --   ALT 14*  --   ALKPHOS 80  --   BILITOT 0.3  --    ------------------------------------------------------------------------------------------------------------------ estimated creatinine clearance is 114.7 mL/min (by C-G formula based on Cr of 0.82). ------------------------------------------------------------------------------------------------------------------ No results for input(s): TSH, T4TOTAL, T3FREE, THYROIDAB in the last 72 hours.  Invalid input(s): FREET3  Cardiac Enzymes  Recent Labs Lab  01/31/15 0636 01/31/15 1305 01/31/15 1531  TROPONINI <0.03 <0.03 <0.03   ------------------------------------------------------------------------------------------------------------------ Invalid input(s):  POCBNP ---------------------------------------------------------------------------------------------------------------  RADIOLOGY: Ct Chest Wo Contrast  02/02/2015  CLINICAL DATA:  49 year old male with history of cirrhosis. Question miliary pulmonary disease on prior CT.Has not been losing wt but has night sweats, no fevers. He does have a cough but no hemoptysis. EXAM: CT CHEST WITHOUT CONTRAST TECHNIQUE: Multidetector CT imaging of the chest was performed following the standard protocol without IV contrast. COMPARISON:  CT 01/19/2015 FINDINGS: Mediastinum/Nodes: No axillary supraclavicular lymphadenopathy. Calcified mediastinal lymph nodes not changed from prior. Largest lymph node is a subcarinal lymph node measuring 20 mm short axis. No pericardial fluid. Esophagus is normal. Lungs/Pleura: The fine nodular pattern in the upper lobes is again demonstrated not significantly changed from CT of 01/19/2015. No new nodularity. No new airspace disease, effusion, or infiltrate. Central airways are normal. Upper abdomen: Limited view of the liver, kidneys, pancreas are unremarkable. Normal adrenal glands. Musculoskeletal: No aggressive osseous lesion. IMPRESSION: 1. No significant change in fine nodular pattern in the upper lobes compared to CT of 01/19/2015. Differential remains atypical infectious process versus chronic granulomas disease. 2. Stable calcified mediastinal lymph nodes. Electronically Signed   By: Genevive BiStewart  Edmunds M.D.   On: 02/02/2015 17:25   Nm Pulmonary Perf And Vent  02/03/2015  CLINICAL DATA:  Chest pain and shortness of breath. EXAM: NUCLEAR MEDICINE VENTILATION - PERFUSION LUNG SCAN TECHNIQUE: Ventilation images were obtained in multiple projections using inhaled aerosol Tc-5477m DTPA. Perfusion images were obtained in multiple projections after intravenous injection of Tc-3677m MAA. RADIOPHARMACEUTICALS:  32.77 Technetium-677m DTPA aerosol inhalation and 4.113 Technetium-5377m MAA IV  COMPARISON:  Chest CT 02/02/2015 and chest radiograph 01/30/2015 FINDINGS: Ventilation: Extensive clumping of the radiopharmaceutical throughout both lungs with a patchy distribution. Perfusion: Scattered perfusion defects in both lungs. Most of the perfusion defects match with ventilation defects. There is concern for mismatches in the hilar regions bilaterally. Mismatches in the hilar regions could be related to clumping of the radiopharmaceutical on the ventilation images. The more peripheral perfusion abnormalities are matched defects. IMPRESSION: Multiple matched defects throughout both lungs. There are mismatches involving the central aspects of both lungs but this could be secondary to clumping on the ventilation images. Intermediate probability for pulmonary embolism. Consider further evaluation with a chest CTA or lower extremity venous duplex to evaluate for venous thromboembolic disease. Electronically Signed   By: Richarda OverlieAdam  Henn M.D.   On: 02/03/2015 11:32   [place] Venous Img Lower Bilateral  02/03/2015  CLINICAL DATA:  Short of breath.  History of DVT. EXAM: BILATERAL LOWER EXTREMITY VENOUS DOPPLER ULTRASOUND TECHNIQUE: Gray-scale sonography with graded compression, as well as color Doppler and duplex ultrasound were performed to evaluate the lower extremity deep venous systems from the level of the common femoral vein and including the common femoral, femoral, profunda femoral, popliteal and calf veins including the posterior tibial, peroneal and gastrocnemius veins when visible. The superficial great saphenous vein was also interrogated. Spectral Doppler was utilized to evaluate flow at rest and with distal augmentation maneuvers in the common femoral, femoral and popliteal veins. COMPARISON:  07/14/2013 FINDINGS: RIGHT LOWER EXTREMITY Common Femoral Vein: No evidence of thrombus. Normal compressibility, respiratory phasicity and response to augmentation. Saphenofemoral Junction: No evidence of thrombus.  Normal compressibility and flow on color Doppler imaging. Profunda Femoral Vein: No evidence of thrombus. Normal compressibility and flow on color Doppler imaging. Femoral Vein: No evidence of thrombus. Normal compressibility, respiratory phasicity and response to  augmentation. Popliteal Vein: No evidence of thrombus. Normal compressibility, respiratory phasicity and response to augmentation. Calf Veins: No evidence of thrombus. Normal compressibility and flow on color Doppler imaging. Superficial Great Saphenous Vein: No evidence of thrombus. Normal compressibility and flow on color Doppler imaging. Venous Reflux:  None. Other Findings:  None. LEFT LOWER EXTREMITY Common Femoral Vein: No evidence of thrombus. Normal compressibility, respiratory phasicity and response to augmentation. Saphenofemoral Junction: No evidence of thrombus. Normal compressibility and flow on color Doppler imaging. Profunda Femoral Vein: No evidence of thrombus. Normal compressibility and flow on color Doppler imaging. Femoral Vein: No evidence of thrombus. Normal compressibility, respiratory phasicity and response to augmentation. Popliteal Vein: No evidence of thrombus. Normal compressibility, respiratory phasicity and response to augmentation. Calf Veins: No evidence of thrombus. Normal compressibility and flow on color Doppler imaging. Superficial Great Saphenous Vein: No evidence of thrombus. Normal compressibility and flow on color Doppler imaging. Venous Reflux:  None. Other Findings:  None. IMPRESSION: No evidence of deep venous thrombosis. Electronically Signed   By: Amie Portland M.D.   On: 02/03/2015 15:07    EKG:  Orders placed or performed during the hospital encounter of 01/30/15  . ED EKG  . ED EKG  . EKG 12-Lead  . EKG 12-Lead    ASSESSMENT AND PLAN:  Active Problems:   Chest pain 1. COPD exacerbation due to bacterial pneumonia, continue patient on levofloxacin, steroids. Continue nebulizer therapy, add  mucinex and mucomyst  2. Chest pain likely musculoskeletal related to cough, supportive therapy, Humibid and Tussionex, some improved.  Still have pain 3. Abnormal CT scan concerning for TB, infectious disease consultation was requested and Dr. Sampson Goon recommended QuantiFERON, pending, HIV negative, negative, AFB sputum was recommended, but patient not able to cough up any sputum repeat CT scan of the chest shows no significant changes  4. Bacterial pneumonia. Unable to obtain Sputum cultures. Continue patient on levofloxacin, continues to have symtoms 5. Hypotension , improved on IV fluids now resolved 6. Tobacco abuse. Counseling. Nicotine replacement therapy 7. Tachycardia with hypotension episode. 8. Abnormal V/Q likely related to CT scan changes noted on CT however due to intermediate V/Q neg doppler of lower ext 9. Anxiety  Xanax     DRUG ALLERGIES: No Known Allergies  CODE STATUS:     Code Status Orders        Start     Ordered   01/31/15 0343  Full code   Continuous     01/31/15 0342    Advance Directive Documentation        Most Recent Value   Type of Advance Directive  Living will   Pre-existing out of facility DNR order (yellow form or pink MOST form)     "MOST" Form in Place?        TOTAL TIME TAKING CARE OF THIS PATIENT: .    Auburn Bilberry M.D on 02/04/2015 at 12:48 PM  Between 7am to 6pm - Pager - [phone]  After 6pm go to [email or web address] - password [email or web address] [facility]  [place] [facility]  Office  [phone]  CC: Primary care physician; Lanier Ensign KEY, MD

## 2015-02-05 DIAGNOSIS — J189 Pneumonia, unspecified organism: Secondary | ICD-10-CM | POA: Diagnosis present

## 2015-02-05 DIAGNOSIS — I499 Cardiac arrhythmia, unspecified: Secondary | ICD-10-CM | POA: Diagnosis present

## 2015-02-05 DIAGNOSIS — Y92239 Unspecified place in hospital as the place of occurrence of the external cause: Secondary | ICD-10-CM | POA: Diagnosis not present

## 2015-02-05 DIAGNOSIS — F319 Bipolar disorder, unspecified: Secondary | ICD-10-CM | POA: Diagnosis present

## 2015-02-05 DIAGNOSIS — Z79891 Long term (current) use of opiate analgesic: Secondary | ICD-10-CM | POA: Diagnosis not present

## 2015-02-05 DIAGNOSIS — F17210 Nicotine dependence, cigarettes, uncomplicated: Secondary | ICD-10-CM | POA: Diagnosis present

## 2015-02-05 DIAGNOSIS — R079 Chest pain, unspecified: Secondary | ICD-10-CM | POA: Diagnosis present

## 2015-02-05 DIAGNOSIS — K219 Gastro-esophageal reflux disease without esophagitis: Secondary | ICD-10-CM | POA: Diagnosis present

## 2015-02-05 DIAGNOSIS — E669 Obesity, unspecified: Secondary | ICD-10-CM | POA: Diagnosis present

## 2015-02-05 DIAGNOSIS — J45909 Unspecified asthma, uncomplicated: Secondary | ICD-10-CM | POA: Diagnosis present

## 2015-02-05 DIAGNOSIS — R000 Tachycardia, unspecified: Secondary | ICD-10-CM | POA: Diagnosis present

## 2015-02-05 DIAGNOSIS — K5790 Diverticulosis of intestine, part unspecified, without perforation or abscess without bleeding: Secondary | ICD-10-CM | POA: Diagnosis present

## 2015-02-05 DIAGNOSIS — Z7982 Long term (current) use of aspirin: Secondary | ICD-10-CM | POA: Diagnosis not present

## 2015-02-05 DIAGNOSIS — G8929 Other chronic pain: Secondary | ICD-10-CM | POA: Diagnosis present

## 2015-02-05 DIAGNOSIS — R296 Repeated falls: Secondary | ICD-10-CM | POA: Diagnosis present

## 2015-02-05 DIAGNOSIS — T380X5A Adverse effect of glucocorticoids and synthetic analogues, initial encounter: Secondary | ICD-10-CM | POA: Diagnosis not present

## 2015-02-05 DIAGNOSIS — Z79899 Other long term (current) drug therapy: Secondary | ICD-10-CM | POA: Diagnosis not present

## 2015-02-05 DIAGNOSIS — B349 Viral infection, unspecified: Secondary | ICD-10-CM | POA: Diagnosis not present

## 2015-02-05 DIAGNOSIS — J159 Unspecified bacterial pneumonia: Secondary | ICD-10-CM | POA: Diagnosis present

## 2015-02-05 DIAGNOSIS — Z6829 Body mass index (BMI) 29.0-29.9, adult: Secondary | ICD-10-CM | POA: Diagnosis not present

## 2015-02-05 DIAGNOSIS — J441 Chronic obstructive pulmonary disease with (acute) exacerbation: Secondary | ICD-10-CM | POA: Diagnosis present

## 2015-02-05 DIAGNOSIS — Z789 Other specified health status: Secondary | ICD-10-CM | POA: Diagnosis not present

## 2015-02-05 DIAGNOSIS — F1290 Cannabis use, unspecified, uncomplicated: Secondary | ICD-10-CM | POA: Diagnosis present

## 2015-02-05 DIAGNOSIS — F419 Anxiety disorder, unspecified: Secondary | ICD-10-CM | POA: Diagnosis present

## 2015-02-05 DIAGNOSIS — I10 Essential (primary) hypertension: Secondary | ICD-10-CM | POA: Diagnosis present

## 2015-02-05 DIAGNOSIS — R918 Other nonspecific abnormal finding of lung field: Secondary | ICD-10-CM | POA: Diagnosis not present

## 2015-02-05 DIAGNOSIS — I959 Hypotension, unspecified: Secondary | ICD-10-CM | POA: Diagnosis present

## 2015-02-05 DIAGNOSIS — Z66 Do not resuscitate: Secondary | ICD-10-CM | POA: Diagnosis present

## 2015-02-05 LAB — EXPECTORATED SPUTUM ASSESSMENT W GRAM STAIN, RFLX TO RESP C: Special Requests: NORMAL

## 2015-02-05 LAB — EXPECTORATED SPUTUM ASSESSMENT W REFEX TO RESP CULTURE

## 2015-02-05 MED ORDER — ACETYLCYSTEINE 20 % IN SOLN
4.0000 mL | Freq: Two times a day (BID) | RESPIRATORY_TRACT | Status: DC
  Administered 2015-02-05 – 2015-02-08 (×6): 4 mL via RESPIRATORY_TRACT
  Filled 2015-02-05 (×6): qty 4

## 2015-02-05 MED ORDER — PREDNISONE 10 MG PO TABS: 10.0000 mg | ORAL_TABLET | Freq: Every day | ORAL | Status: DC

## 2015-02-05 MED ORDER — PREDNISONE 20 MG PO TABS
20.0000 mg | ORAL_TABLET | Freq: Every day | ORAL | Status: AC
  Administered 2015-02-09: 09:00:00 20 mg via ORAL
  Filled 2015-02-05: qty 1

## 2015-02-05 MED ORDER — PREDNISONE 20 MG PO TABS
30.0000 mg | ORAL_TABLET | Freq: Every day | ORAL | Status: AC
  Administered 2015-02-08: 07:00:00 30 mg via ORAL
  Filled 2015-02-05: qty 1

## 2015-02-05 MED ORDER — PREDNISONE 20 MG PO TABS
40.0000 mg | ORAL_TABLET | Freq: Every day | ORAL | Status: AC
  Administered 2015-02-07: 08:00:00 40 mg via ORAL
  Filled 2015-02-05: qty 2

## 2015-02-05 MED ORDER — PREDNISONE 50 MG PO TABS
50.0000 mg | ORAL_TABLET | Freq: Every day | ORAL | Status: AC
  Filled 2015-02-05: qty 1

## 2015-02-05 NOTE — Progress Notes (Signed)
Initial Nutrition Assessment   INTERVENTION:   Meals and Snacks: Cater to patient preferences Medical Food Supplement Therapy: will recommend on follow if intake poor   NUTRITION DIAGNOSIS:   No nutrition diagnosis at this time  GOAL:   Patient will meet greater than or equal to 90% of their needs  MONITOR:    (Energy Intake, Electrolyte and Renal Profile, Pulmonary Profile, Anthropometrics)  REASON FOR ASSESSMENT:   LOS    ASSESSMENT:   Pt admitted with CP and SOB. Pt currently on airborne isolation concerning for TB.  Past Medical History  Diagnosis Date  . Polysubstance abuse   . Bipolar disorder (HCC)   . Esophageal stricture   . H/O tonic-clonic seizures   . Anxiety   . GERD (gastroesophageal reflux disease)   . Chronic pain   . Asthma   . Hypertension    Past Surgical History  Procedure Laterality Date  . Appendectomy      complicated by perforation and abscess  . Peg w/tracheostomy placement    . Peg tube removal    . Tracheostomy closure    . Esophagogastroduodenoscopy (egd) with esophageal dilation    . Hernia repair  Nov 2016    [facility] Genene ChurnBunzendahl    Diet Order:  Diet Heart Room service appropriate?: Yes; Fluid consistency:: Thin   Current Nutrition: Pt eating 100% of meals recorded. Per RN Denny PeonErin pt with a very good appetite eating 100% of all meals.   Food/Nutrition-Related History: Per [facility]ST pt with decreased appetite PTA.    Scheduled Medications:  . acetylcysteine  4 mL Nebulization BID  . ALPRAZolam  0.5 mg Oral TID  . aspirin EC  81 mg Oral Daily  . atorvastatin  10 mg Oral q1800  . busPIRone  15 mg Oral TID  . chlorpheniramine-HYDROcodone  5 mL Oral Q12H  . clotrimazole   Topical BID  . diltiazem  60 mg Oral 4 times per day  . docusate sodium  100 mg Oral BID  . enoxaparin (LOVENOX) injection  40 mg Subcutaneous Q24H  . famotidine  20 mg Oral BID  . FLUoxetine  20 mg Oral BID  . gabapentin  300 mg Oral  BID  . guaiFENesin  600 mg Oral BID  . ipratropium-albuterol  3 mL Nebulization TID  . levofloxacin  500 mg Oral Daily  . metoprolol succinate  50 mg Oral Daily  . nicotine  14 mg Transdermal Daily  . [START ON 02/06/2015] predniSONE  50 mg Oral Q breakfast   Followed by  . [START ON 02/07/2015] predniSONE  40 mg Oral Q breakfast   Followed by  . [START ON 02/08/2015] predniSONE  30 mg Oral Q breakfast   Followed by  . [START ON 02/09/2015] predniSONE  20 mg Oral Q breakfast   Followed by  . [START ON 02/10/2015] predniSONE  10 mg Oral Q breakfast  . sodium chloride  3 mL Intravenous Q12H  . venlafaxine XR  150 mg Oral Q breakfast     Electrolyte/Renal Profile and Glucose Profile:   Recent Labs Lab 01/30/15 2054 02/04/15 0347  NA 141  --   K 3.8  --   CL 104  --   CO2 30  --   BUN 11  --   CREATININE 1.06 0.82  CALCIUM 9.0  --   GLUCOSE 158*  --    Protein Profile:  Recent Labs Lab 01/30/15 2054  ALBUMIN 3.4*    Gastrointestinal  Profile: Last BM: 02/04/2015   Weight Change: Per [facility] weight stable recently   Height:   Ht Readings from Last 1 Encounters:  01/30/15  (1.702 m)    Weight:   Wt Readings from Last 1 Encounters:  02/05/15 189 lb 4.8 oz (85.866 kg)    Wt Readings from Last 10 Encounters:  02/05/15 189 lb 4.8 oz (85.866 kg)  01/19/15 186 lb 4.8 oz (84.505 kg)  01/14/15 186 lb (84.369 kg)  01/02/15 186 lb (84.369 kg)  01/01/15 186 lb (84.369 kg)  12/29/14 186 lb (84.369 kg)     BMI:  Body mass index is 29.64 kg/(m^2).   EDUCATION NEEDS:   No education needs identified at this time   LOW Care Level  Leda Quail, [place], LDN Pager [phone]

## 2015-02-05 NOTE — Progress Notes (Signed)
Asked by Dr Meredeth IdeFleming to perform FOB to R/O TB. This is scheduled for 11/22 @ 1:00 PM  Justin Fischeravid Alexandria Current, MD PCCM service Mobile [phone] Pager [phone]

## 2015-02-05 NOTE — Progress Notes (Signed)
[facility] - [place] at [facility]   PATIENT NAME: [name]    MR#:  [id number]  DATE OF BIRTH:  13-Jul-1965  SUBJECTIVE:  CHIEF COMPLAINT:   Chief Complaint  Patient presents with  . Chest Pain     Patient's breathing is improved, states that his cough is breaking up with the Mucomyst treatment therapy.   Review of Systems  Constitutional: Negative for fever, chills and weight loss.  HENT: Negative for congestion.   Eyes: Negative for blurred vision and double vision.  Respiratory: Positive for cough, sputum production, shortness of breath and wheezing.   improving Cardiovascular: Positive chest pain, palpitations, orthopnea, leg swelling and PND.  Gastrointestinal: Negative for nausea, vomiting, positive abdominal pain, no diarrhea, or constipation and blood in stool.  Genitourinary: Negative for dysuria, urgency, frequency and hematuria.  Musculoskeletal: Negative for falls.  Neurological: Negative for dizziness, tremors, focal weakness and headaches.  Endo/Heme/Allergies: Does not bruise/bleed easily.  Psychiatric/Behavioral: Negative for depression. The patient does not have insomnia.   positive for anxiety   VITAL SIGNS: Blood pressure 129/91, pulse 67, temperature 97.8 F (36.6 C), temperature source Oral, resp. rate 18, height  (1.702 m), weight 85.866 kg (189 lb 4.8 oz), SpO2 95 %.  PHYSICAL EXAMINATION:   GENERAL:  49 y.o.-year-old patient lying in the bed with no acute distress.  EYES: Pupils equal, round, reactive to light and accommodation. No scleral icterus. Extraocular muscles intact.  HEENT: Head atraumatic, normocephalic. Oropharynx and nasopharynx clear.  NECK:  Supple, no jugular venous distention. No thyroid enlargement, no tenderness.  LUNGS. Occasional wheezing, no rales or rhonchi no crackles  CARDIOVASCULAR: S1, S2 normal. No murmurs, rubs, or gallops. Tender chest on palpation diffusely ABDOMEN: Soft, diffusely  tender to light touch, no rebound or guarding were noted, nondistended. Bowel sounds present. No organomegaly or mass.  EXTREMITIES: No pedal edema, cyanosis, or clubbing.  NEUROLOGIC: Cranial nerves II through XII are intact. Muscle strength 5/5 in all extremities. Sensation intact. Gait not checked.  PSYCHIATRIC: The patient is alert and oriented x 3.  SKIN: No obvious rash, lesion, or ulcer.   ORDERS/RESULTS REVIEWED:   CBC  Recent Labs Lab 01/30/15 2054 01/31/15 0636 02/02/15 0655  WBC 10.3 8.4  --   HGB 14.0 13.4  --   HCT 41.8 39.8*  --   PLT 133* 118* 145*  MCV 93.0 92.8  --   MCH 31.1 31.2  --   MCHC 33.4 33.7  --   RDW 15.5* 15.2*  --   LYMPHSABS 2.6  --   --   MONOABS 0.6  --   --   EOSABS 0.2  --   --   BASOSABS 0.1  --   --    ------------------------------------------------------------------------------------------------------------------  Chemistries   Recent Labs Lab 01/30/15 2054 02/04/15 0347  NA 141  --   K 3.8  --   CL 104  --   CO2 30  --   GLUCOSE 158*  --   BUN 11  --   CREATININE 1.06 0.82  CALCIUM 9.0  --   AST 18  --   ALT 14*  --   ALKPHOS 80  --   BILITOT 0.3  --    ------------------------------------------------------------------------------------------------------------------ estimated creatinine clearance is 111.6 mL/min (by C-G formula based on Cr of 0.82). ------------------------------------------------------------------------------------------------------------------ No results for input(s): TSH, T4TOTAL, T3FREE, THYROIDAB in the last 72 hours.  Invalid input(s): FREET3  Cardiac Enzymes  Recent Labs Lab 01/31/15 0636  01/31/15 1305 01/31/15 1531  TROPONINI <0.03 <0.03 <0.03   ------------------------------------------------------------------------------------------------------------------ Invalid input(s):  POCBNP ---------------------------------------------------------------------------------------------------------------  RADIOLOGY: [place] Venous Img Lower Bilateral  02/03/2015  CLINICAL DATA:  Short of breath.  History of DVT. EXAM: BILATERAL LOWER EXTREMITY VENOUS DOPPLER ULTRASOUND TECHNIQUE: Gray-scale sonography with graded compression, as well as color Doppler and duplex ultrasound were performed to evaluate the lower extremity deep venous systems from the level of the common femoral vein and including the common femoral, femoral, profunda femoral, popliteal and calf veins including the posterior tibial, peroneal and gastrocnemius veins when visible. The superficial great saphenous vein was also interrogated. Spectral Doppler was utilized to evaluate flow at rest and with distal augmentation maneuvers in the common femoral, femoral and popliteal veins. COMPARISON:  07/14/2013 FINDINGS: RIGHT LOWER EXTREMITY Common Femoral Vein: No evidence of thrombus. Normal compressibility, respiratory phasicity and response to augmentation. Saphenofemoral Junction: No evidence of thrombus. Normal compressibility and flow on color Doppler imaging. Profunda Femoral Vein: No evidence of thrombus. Normal compressibility and flow on color Doppler imaging. Femoral Vein: No evidence of thrombus. Normal compressibility, respiratory phasicity and response to augmentation. Popliteal Vein: No evidence of thrombus. Normal compressibility, respiratory phasicity and response to augmentation. Calf Veins: No evidence of thrombus. Normal compressibility and flow on color Doppler imaging. Superficial Great Saphenous Vein: No evidence of thrombus. Normal compressibility and flow on color Doppler imaging. Venous Reflux:  None. Other Findings:  None. LEFT LOWER EXTREMITY Common Femoral Vein: No evidence of thrombus. Normal compressibility, respiratory phasicity and response to augmentation. Saphenofemoral Junction: No evidence of thrombus.  Normal compressibility and flow on color Doppler imaging. Profunda Femoral Vein: No evidence of thrombus. Normal compressibility and flow on color Doppler imaging. Femoral Vein: No evidence of thrombus. Normal compressibility, respiratory phasicity and response to augmentation. Popliteal Vein: No evidence of thrombus. Normal compressibility, respiratory phasicity and response to augmentation. Calf Veins: No evidence of thrombus. Normal compressibility and flow on color Doppler imaging. Superficial Great Saphenous Vein: No evidence of thrombus. Normal compressibility and flow on color Doppler imaging. Venous Reflux:  None. Other Findings:  None. IMPRESSION: No evidence of deep venous thrombosis. Electronically Signed   By: Amie Portlandavid  Ormond M.D.   On: 02/03/2015 15:07    EKG:  Orders placed or performed during the hospital encounter of 01/30/15  . ED EKG  . ED EKG  . EKG 12-Lead  . EKG 12-Lead    ASSESSMENT AND PLAN:  Active Problems:   Chest pain   Pneumonia 1. Acute on chronic COPD exacerbation due to bacterial pneumonia, continue patient on levofloxacin, changed to oral steroids. Continue Mucomyst and Mucinex 2. Chest pain likely musculoskeletal related to cough, supportive therapy, Humibid and Tussionex, some improved.  Still have pain 3. Abnormal CT scan concerning for TB, patient with intermediate QuantiFERON test, I have discussed with both infectious disease and pulmonary patient may need bronchoscopy, continue negative pressure isolation for now. 4. Bacterial pneumonia. Unable to obtain Sputum cultures. Continue patient on levofloxacin, continues to have symtoms 5. Hypotension , improved on IV fluids now resolved 6. Tobacco abuse. Counseling. Nicotine replacement therapy 7. Tachycardia with hypotension episode. 8. Abnormal V/Q likely related to CT scan changes noted on CT however due to intermediate V/Q neg doppler of lower ext 9. Anxiety continue Xanax     DRUG ALLERGIES: No Known  Allergies  CODE STATUS:     Code Status Orders        Start     Ordered   01/31/15 [id number]  Full code   Continuous     01/31/15 0342    Advance Directive Documentation        Most Recent Value   Type of Advance Directive  Living will   Pre-existing out of facility DNR order (yellow form or pink MOST form)     "MOST" Form in Place?        TOTAL TIME TAKING CARE OF THIS PATIENT: .    Auburn Bilberry M.D on 02/05/2015 at 1:34 PM  Between 7am to 6pm - Pager - [phone]  After 6pm go to [email or web address] - password [email or web address] [facility]  [facility]  Office  [phone]  CC: Primary care physician; Lanier Ensign KEY, MD

## 2015-02-05 NOTE — Clinical Social Work Note (Signed)
CSW spoke with [facility] to confirm that pt was from facility. Facility is able to accept pt when stable for discharge. Full assessment to follow. CSW will continue to follow.   Dede QuerySarah Avielle Imbert, MSW, LCSW Clinical Social Worker  [phone]

## 2015-02-05 NOTE — Plan of Care (Signed)
Problem: Physical Regulation: Goal: Ability to maintain clinical measurements within normal limits will improve Outcome: Progressing Patient on airborne precautions still. Sputum sample sent this morning. Pulmonologist to do bronchoscopy tomorrow. Consent signed.  On PO ABX. Remains on O2 .

## 2015-02-06 ENCOUNTER — Encounter
Admission: EM | Disposition: A | Discharge status: Home / Self Care | Payer: Self-pay | Source: Home / Self Care | Attending: Internal Medicine

## 2015-02-06 DIAGNOSIS — R918 Other nonspecific abnormal finding of lung field: Secondary | ICD-10-CM

## 2015-02-06 SURGERY — BRONCHOSCOPY, FLEXIBLE
Anesthesia: Moderate Sedation

## 2015-02-06 MED ORDER — MIDAZOLAM HCL 5 MG/5ML IJ SOLN
INTRAMUSCULAR | Status: AC
  Administered 2015-02-06: 12:00:00
  Filled 2015-02-06: qty 10

## 2015-02-06 MED ORDER — PHENYLEPHRINE HCL 0.25 % NA SOLN
1.0000 | Freq: Four times a day (QID) | NASAL | Status: DC | PRN
  Filled 2015-02-06 (×3): qty 15

## 2015-02-06 MED ORDER — FENTANYL CITRATE (PF) 100 MCG/2ML IJ SOLN
INTRAMUSCULAR | Status: AC
  Administered 2015-02-06: 12:00:00
  Filled 2015-02-06: qty 4

## 2015-02-06 MED ORDER — FENTANYL CITRATE (PF) 100 MCG/2ML IJ SOLN
INTRAMUSCULAR | Status: AC | PRN
  Administered 2015-02-06: 13:00:00 50 ug via INTRAVENOUS

## 2015-02-06 MED ORDER — BUTAMBEN-TETRACAINE-BENZOCAINE 2-2-14 % EX AERO
1.0000 | INHALATION_SPRAY | Freq: Once | CUTANEOUS | Status: AC
  Administered 2015-02-06: 1 via TOPICAL
  Filled 2015-02-06: qty 20

## 2015-02-06 MED ORDER — LIDOCAINE HCL 2 % EX GEL
1.0000 "application " | Freq: Once | CUTANEOUS | Status: AC
  Administered 2015-02-06: 1 via TOPICAL
  Filled 2015-02-06: qty 5

## 2015-02-06 MED ORDER — MIDAZOLAM HCL 2 MG/2ML IJ SOLN
INTRAMUSCULAR | Status: AC | PRN
  Administered 2015-02-06 (×2): 2 mg via INTRAVENOUS

## 2015-02-06 MED ORDER — PHENYLEPHRINE HCL 0.5 % NA SOLN
1.0000 [drp] | Freq: Four times a day (QID) | NASAL | Status: DC | PRN
  Administered 2015-02-06: 1 [drp] via NASAL
  Filled 2015-02-06: qty 15

## 2015-02-06 NOTE — Progress Notes (Addendum)
[facility] - [place] at [facility]   PATIENT NAME: Justin Bassetimothy Hooper    MR#:  [id number]  DATE OF BIRTH:  06/29/1965  SUBJECTIVE:  CHIEF COMPLAINT:   Chief Complaint  Patient presents with  . Chest Pain     Patient's breathing is improved, states that his cough is breaking up with the Mucomyst treatment therapy.   Review of Systems  Constitutional: Negative for fever, chills and weight loss.  HENT: Negative for congestion.   Eyes: Negative for blurred vision and double vision.  Respiratory: Positive for cough, sputum production, shortness of breath and wheezing.   improving Cardiovascular: Positive chest pain, palpitations, orthopnea, leg swelling and PND.  Gastrointestinal: Negative for nausea, vomiting, positive abdominal pain, no diarrhea, or constipation and blood in stool.  Genitourinary: Negative for dysuria, urgency, frequency and hematuria.  Musculoskeletal: Negative for falls.  Neurological: Negative for dizziness, tremors, focal weakness and headaches.  Endo/Heme/Allergies: Does not bruise/bleed easily.  Psychiatric/Behavioral: Negative for depression. The patient does not have insomnia.   positive for anxiety   VITAL SIGNS: Blood pressure 104/85, pulse 76, temperature 98.1 F (36.7 C), temperature source Oral, resp. rate 12, height 5\' 7"  (1.702 m), weight 88.587 kg (195 lb 4.8 oz), SpO2 98 %.  PHYSICAL EXAMINATION:   GENERAL:  49 y.o.-year-old patient lying in the bed with no acute distress.  EYES: Pupils equal, round, reactive to light and accommodation. No scleral icterus. Extraocular muscles intact.  HEENT: Head atraumatic, normocephalic. Oropharynx and nasopharynx clear.  NECK:  Supple, no jugular venous distention. No thyroid enlargement, no tenderness.  LUNGS. Occasional wheezing, no rales or rhonchi no crackles  CARDIOVASCULAR: S1, S2 normal. No murmurs, rubs, or gallops. Tender chest on palpation diffusely ABDOMEN: Soft, diffusely  tender to light touch, no rebound or guarding were noted, nondistended. Bowel sounds present. No organomegaly or mass.  EXTREMITIES: No pedal edema, cyanosis, or clubbing.  NEUROLOGIC: Cranial nerves II through XII are intact. Muscle strength 5/5 in all extremities. Sensation intact. Gait not checked.  PSYCHIATRIC: The patient is alert and oriented x 3.  SKIN: No obvious rash, lesion, or ulcer.   ORDERS/RESULTS REVIEWED:   CBC  Recent Labs Lab 01/30/15 2054 01/31/15 0636 02/02/15 0655  WBC 10.3 8.4  --   HGB 14.0 13.4  --   HCT 41.8 39.8*  --   PLT 133* 118* 145*  MCV 93.0 92.8  --   MCH 31.1 31.2  --   MCHC 33.4 33.7  --   RDW 15.5* 15.2*  --   LYMPHSABS 2.6  --   --   MONOABS 0.6  --   --   EOSABS 0.2  --   --   BASOSABS 0.1  --   --    ------------------------------------------------------------------------------------------------------------------  Chemistries   Recent Labs Lab 01/30/15 2054 02/04/15 0347  NA 141  --   K 3.8  --   CL 104  --   CO2 30  --   GLUCOSE 158*  --   BUN 11  --   CREATININE 1.06 0.82  CALCIUM 9.0  --   AST 18  --   ALT 14*  --   ALKPHOS 80  --   BILITOT 0.3  --    ------------------------------------------------------------------------------------------------------------------ estimated creatinine clearance is 113.2 mL/min (by C-G formula based on Cr of 0.82). ------------------------------------------------------------------------------------------------------------------ No results for input(s): TSH, T4TOTAL, T3FREE, THYROIDAB in the last 72 hours.  Invalid input(s): FREET3  Cardiac Enzymes  Recent Labs Lab 01/31/15 0636  01/31/15 1305 01/31/15 1531  TROPONINI <0.03 <0.03 <0.03   ------------------------------------------------------------------------------------------------------------------ Invalid input(s):  POCBNP ---------------------------------------------------------------------------------------------------------------  RADIOLOGY: No results found.  EKG:  Orders placed or performed during the hospital encounter of 01/30/15  . ED EKG  . ED EKG  . EKG 12-Lead  . EKG 12-Lead    ASSESSMENT AND PLAN:  1. Acute on chronic COPD exacerbation due to bacterial pneumonia, continue patient on levofloxacin, continue Mucomyst and Mucinex 2. Chest pain likely musculoskeletal related to cough, supportive therapy, Humibid and Tussionex, improved  3. Abnormal CT scan concerning for TB, patient with intermediate QuantiFERON test, went for bronchoscopy for today, also other tests will be done including fungal. 4. Bacterial pneumonia. Change continue oral Levaquin 5. Hypotension , improved on IV fluids now resolved 6. Tobacco abuse. Counseling. Nicotine replacement therapy 7. Tachycardia with hypotension episode. Now resolve 8. Abnormal V/Q likely related to CT scan changes noted on CT however due to intermediate V/Q Dopplers of lower extremity check were negative  9. Anxiety continue Xanax     DRUG ALLERGIES: No Known Allergies  CODE STATUS: Full    Code Status Orders        Start     Ordered   01/31/15 0343  Full code   Continuous     01/31/15 0342    Advance Directive Documentation        Most Recent Value   Type of Advance Directive  Living will   Pre-existing out of facility DNR order (yellow form or pink MOST form)     "MOST" Form in Place?        TOTAL TIME TAKING CARE OF THIS PATIENT: .    Auburn Bilberry M.D on 02/06/2015 at 1:00 PM  Between 7am to 6pm - Pager - [phone]  After 6pm go to [email or web address] - password [email or web address] [facility]  [place] [facility]  Office  [phone]  CC: Primary care physician; Lanier Ensign KEY, MD

## 2015-02-06 NOTE — Sedation Documentation (Signed)
Patient tolerated procedure well. Arousable to voice, reporting tolerable pain control. 4mg  versed and 50mcg fentanyl total administered during procedure. VSS. Will transfer back to inpatient room after recovery.

## 2015-02-06 NOTE — Progress Notes (Signed)
[facility] Date of Admission:  01/30/2015     ID: [name] is a 49 y.o. male with PNA, TB suspect  Active Problems:   Chest pain   Pneumonia   Subjective: HAd bronch today. No fevers  ROS  Eleven systems are reviewed and negative except per hpi  Medications:  Antibiotics Given (last 72 hours)    Date/Time Action Medication Dose   02/04/15 0913 Given   levofloxacin (LEVAQUIN) tablet 500 mg 500 mg   02/05/15 0950 Given   levofloxacin (LEVAQUIN) tablet 500 mg 500 mg     . acetylcysteine  4 mL Nebulization BID  . ALPRAZolam  0.5 mg Oral TID  . aspirin EC  81 mg Oral Daily  . atorvastatin  10 mg Oral q1800  . busPIRone  15 mg Oral TID  . butamben-tetracaine-benzocaine  1 spray Topical Once  . chlorpheniramine-HYDROcodone  5 mL Oral Q12H  . clotrimazole   Topical BID  . diltiazem  60 mg Oral 4 times per day  . docusate sodium  100 mg Oral BID  . famotidine  20 mg Oral BID  . fentaNYL      . FLUoxetine  20 mg Oral BID  . gabapentin  300 mg Oral BID  . guaiFENesin  600 mg Oral BID  . ipratropium-albuterol  3 mL Nebulization TID  . levofloxacin  500 mg Oral Daily  . lidocaine  1 application Topical Once  . metoprolol succinate  50 mg Oral Daily  . midazolam      . nicotine  14 mg Transdermal Daily  . predniSONE  50 mg Oral Q breakfast   Followed by  . [START ON 02/07/2015] predniSONE  40 mg Oral Q breakfast   Followed by  . [START ON 02/08/2015] predniSONE  30 mg Oral Q breakfast   Followed by  . [START ON 02/09/2015] predniSONE  20 mg Oral Q breakfast   Followed by  . [START ON 02/10/2015] predniSONE  10 mg Oral Q breakfast  . sodium chloride  3 mL Intravenous Q12H  . venlafaxine XR  150 mg Oral Q breakfast    Objective: Vital signs in last 24 hours: Temp:  [98.1 F (36.7 C)-98.6 F (37 C)] 98.6 F (37 C) (11/22 1338) Pulse Rate:  [68-100] 89 (11/22 1338) Resp:  [12-20] 18 (11/22 1338) BP: (104-147)/(76-92) 113/79  mmHg (11/22 1338) SpO2:  [89 %-98 %] 94 % (11/22 1338) FiO2 (%):  [40 %] 40 % (11/22 1254) Weight:  [88.587 kg (195 lb 4.8 oz)] 88.587 kg (195 lb 4.8 oz) (11/22 0500) Constitutional: He is oriented to person, place, and time. Obese HENT: PERRLA Mouth/Throat: Oropharynx is clear and moist. No thrush No oropharyngeal exudate.  Cardiovascular: Normal rate, regular rhythm and normal heart sounds.  Pulmonary/Chest: rhonchi and wheeze throughout Abdominal: Soft but somewhat distended. Bowel sounds are normal. He exhibits no distension. There is mild diffuse tenderness Lymphadenopathy: He has no cervical adenopathy.  Neurological: He is alert and oriented to person, place, and time.  Skin: Skin is warm and dry. No rash noted. No erythema.  Psychiatric: He has a normal mood and affect. His behavior is normal.   Lab Results  Recent Labs  02/04/15 0347  CREATININE 0.82    Microbiology: Results for orders placed or performed during the hospital encounter of 01/30/15  MRSA PCR Screening     Status: None   Collection Time: 01/31/15  4:30 AM  Result Value Ref Range Status  MRSA by PCR NEGATIVE NEGATIVE Final    Comment:        The GeneXpert MRSA Assay (FDA approved for NASAL specimens only), is one component of a comprehensive MRSA colonization surveillance program. It is not intended to diagnose MRSA infection nor to guide or monitor treatment for MRSA infections.   Culture, expectorated sputum-assessment     Status: None   Collection Time: 02/05/15  9:55 AM  Result Value Ref Range Status   Specimen Description EXPECTORATED SPUTUM  Final   Special Requests Normal  Final   Sputum evaluation THIS SPECIMEN IS ACCEPTABLE FOR SPUTUM CULTURE  Final   Report Status 02/05/2015 FINAL  Final  Culture, respiratory (NON-Expectorated)     Status: None (Preliminary result)   Collection Time: 02/05/15  9:55 AM  Result Value Ref Range Status   Specimen Description EXPECTORATED SPUTUM  Final    Special Requests Normal Reflexed from [id number]  Final   Gram Stain PENDING  Incomplete   Culture Consistent with normal respiratory flora.  Final   Report Status PENDING  Incomplete    Studies/Results: No results found.  Assessment/Plan: [name] is a 49 y.o. male with hx of etoh liver disease, COPD, Poly substance abuse, esophageal stricture, Gerd whom I saw in April 2015 due to ruptured appendix and candidemia.  He was readmitted 11/3-11/5 with chest pain, had neg stress test and was dced. 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 Readmitted 11/15 with continued chest pain. He states that he has not felt himself since his surgery a year and a half ago with chronic pain in abd and L side of chest. He follows with Dr Sallee Lange at [facility]. He says only started eating reg food 3 months ago. Has not been losing wt but has night sweats, no fevers. He does have a cough but no hemoptysis. Was in prison for 45 days in 2005 and had TB test but then was "cleared" - does not sound like he had treatment. He has not had fever since admission, has nml wbc. HIV negative He certainly has copd with wheezing and rhonchi. Has hx esophageal issues and etoh use so may be an aspiration risk. Not convinced this is a high probability this is TB but certainly has risk factors. He is also apparently in a group home now which complicates the DC planning for TB suspects. Indeterminate QFG  Recommendations If he was not in a group home setting I would suggest TB wu can be done as otpt once over the current COPD exacerbation. He states he is on home O2 AFB pending from Bronch today as well as fungal and routine cxs  IF AFB from bronch neg can be dced home on treatment for COPD. Treat COPD exacerbation.  FITZGERALD, DAVID   02/06/2015, 1:58 PM

## 2015-02-06 NOTE — Plan of Care (Signed)
Problem: Safety: Goal: Ability to remain free from injury will improve Outcome: Progressing High fall risk. Bed alarm on, hourly rounding. Safety camera in room maintained. Pt understands how to use call system for assistance. Safe environment provided.   Problem: Physical Regulation: Goal: Ability to maintain clinical measurements within normal limits will improve Outcome: Progressing C/o left side chest pain relieved by PRN oxycodone. VSS, afebrile. Bronchoscopy planned for today. NPO starting at 5am.

## 2015-02-06 NOTE — Procedures (Signed)
Indication:   R/O TB  Premedication:  midaz 4 mg fent 50 mcg   Anesthesia: Cetacaine to throat Viscous lidocaine to R naris 40 cc 1% lidocaine used during procedure  Procedure: After adequate sedation and anesthesia, the bronchoscope was introduced via the R naris and advanced into the posterior pharynx. Further anesthesia was obtained with 1% lidocaine and the scope was advanced into the trachea. Complete airway anesthesia was achieved with 1% lidocaine and a thorough airway examination was performed. This revealed the following.  Findings:  Normal airway anatomy Mild diffuse mucoid secretions Mild mucosal changes of acute bronchitis  Specimens:   BAL RUL sent for cytology, AFB, fungal, pneumocystis and bacterial studies  Post procedure evaluation:  The patient tolerated the procedure well with no major complications   Khaylee Mcevoy SimondBilly Fischers, MD;  PCCM service; Mobile [phone]

## 2015-02-06 NOTE — Sedation Documentation (Signed)
Report called to [facility]. Will transfer shortly.

## 2015-02-06 NOTE — Plan of Care (Signed)
Problem: Safety: Goal: Ability to remain free from injury will improve Outcome: Progressing VSS, Had bronc today and tolerated well, remains on air borne isolation until cultures come back

## 2015-02-07 ENCOUNTER — Inpatient Hospital Stay: Payer: Medicaid Other

## 2015-02-07 LAB — CBC WITH DIFFERENTIAL/PLATELET
BASOS ABS: 0 10*3/uL (ref 0–0.1)
Basophils Relative: 0 %
EOS ABS: 0 10*3/uL (ref 0–0.7)
Eosinophils Relative: 0 %
HEMATOCRIT: 47.5 % (ref 40.0–52.0)
HEMOGLOBIN: 15.7 g/dL (ref 13.0–18.0)
LYMPHS PCT: 7 %
Lymphs Abs: 1.1 10*3/uL (ref 1.0–3.6)
MCH: 30.9 pg (ref 26.0–34.0)
MCHC: 33.1 g/dL (ref 32.0–36.0)
MCV: 93.2 fL (ref 80.0–100.0)
MONOS PCT: 8 %
Monocytes Absolute: 1.2 10*3/uL — ABNORMAL HIGH (ref 0.2–1.0)
NEUTROS PCT: 85 %
Neutro Abs: 13 10*3/uL — ABNORMAL HIGH (ref 1.4–6.5)
Platelets: 166 10*3/uL (ref 150–440)
RBC: 5.1 MIL/uL (ref 4.40–5.90)
RDW: 15.7 % — AB (ref 11.5–14.5)
WBC: 15.3 10*3/uL — AB (ref 3.8–10.6)

## 2015-02-07 LAB — BASIC METABOLIC PANEL
ANION GAP: 7 (ref 5–15)
BUN: 28 mg/dL — ABNORMAL HIGH (ref 6–20)
CALCIUM: 8.7 mg/dL — AB (ref 8.9–10.3)
CHLORIDE: 99 mmol/L — AB (ref 101–111)
CO2: 35 mmol/L — AB (ref 22–32)
Creatinine, Ser: 1 mg/dL (ref 0.61–1.24)
GFR calc non Af Amer: >60 mL/min (ref >60)
Glucose, Bld: 160 mg/dL — ABNORMAL HIGH (ref 65–99)
POTASSIUM: 4.3 mmol/L (ref 3.5–5.1)
Sodium: 141 mmol/L (ref 135–145)

## 2015-02-07 LAB — MISC LABCORP TEST (SEND OUT): Labcorp test code: 180232

## 2015-02-07 MED ORDER — ENOXAPARIN SODIUM 40 MG/0.4ML ~~LOC~~ SOLN
40.0000 mg | SUBCUTANEOUS | Status: DC
  Administered 2015-02-07 – 2015-02-08 (×2): 40 mg via SUBCUTANEOUS
  Filled 2015-02-07 (×2): qty 0.4

## 2015-02-07 NOTE — Progress Notes (Signed)
E note Had bronch done. Sample pending.  Rec If AFB from bronch negative can dc home.  Kennon PortelaLikley will be back tomorrow or Friday- can call micro [phone] to see when back He is s/p 8 days levo so will stop now.

## 2015-02-07 NOTE — Plan of Care (Addendum)
Problem: Education: Goal: Knowledge of [facility] Education information/materials will improve Outcome: Progressing Patient c/o pain in chest. Percocet and morphine given PRN. Patient stated relief. Remains on airborne precautions. Awaiting on sputum results. Patient c/o being warm with clammy skin. Temperature WDL. MD aware  Problem: Safety: Goal: Ability to remain free from injury will improve Outcome: Progressing No falls. Patient calls out for assistance.  Notified MD of sudden headache and progressive blurred vision throughout the day. VSS. No other neuro deficits. CT of head ordered.

## 2015-02-07 NOTE — Progress Notes (Signed)
[facility] [place] [facility]   PATIENT NAME: Justin Bassetimothy Garin    MR#:  [id number]  DATE OF BIRTH:  05/24/1965  SUBJECTIVE: Patient had a bronchoscopy yesterday, AFB results are pending. Complains of soreness in the Chest due to bronchoscopy. Says  His cough is better.  CHIEF COMPLAINT:   Chief Complaint  Patient presents with  . Chest Pain     Patient's breathing is improved, states that his cough is breaking up with the Mucomyst treatment therapy.   Review of Systems  Constitutional: Negative for fever, chills and weight loss.  HENT: Negative for congestion.   Eyes: Negative for blurred vision and double vision.  Respiratory: Positive for cough, sputum production,  Cardiovascular: Positive chest pain, palpitations, orthopnea, leg swelling and PND.  Gastrointestinal: Negative for nausea, vomiting, positive abdominal pain, no diarrhea, or constipation and blood in stool.  Genitourinary: Negative for dysuria, urgency, frequency and hematuria.  Musculoskeletal: Negative for falls.  Neurological: Negative for dizziness, tremors, focal weakness and headaches.  Endo/Heme/Allergies: Does not bruise/bleed easily.  Psychiatric/Behavioral: Negative for depression. The patient does not have insomnia.   positive for anxiety   VITAL SIGNS: Blood pressure 120/78, pulse 69, temperature 98.5 F (36.9 C), temperature source Oral, resp. rate 20, height 5\' 7"  (1.702 m), weight 86.682 kg (191 lb 1.6 oz), SpO2 96 %.  PHYSICAL EXAMINATION:   GENERAL:  49 y.o.-year-old patient lying in the bed with no acute distress.  EYES: Pupils equal, round, reactive to light and accommodation. No scleral icterus. Extraocular muscles intact.  HEENT: Head atraumatic, normocephalic. Oropharynx and nasopharynx clear.  NECK:  Supple, no jugular venous distention. No thyroid enlargement, no tenderness.  LUNGS. Occasional wheezing, no rales or rhonchi no crackles  CARDIOVASCULAR: S1, S2  normal. No murmurs, rubs, or gallops. Tender chest on palpation diffusely ABDOMEN: Soft, diffusely tender to light touch, no rebound or guarding were noted, nondistended. Bowel sounds present. No organomegaly or mass.  EXTREMITIES: No pedal edema, cyanosis, or clubbing.  NEUROLOGIC: Cranial nerves II through XII are intact. Muscle strength 5/5 in all extremities. Sensation intact. Gait not checked.  PSYCHIATRIC: The patient is alert and oriented x 3.  SKIN: No obvious rash, lesion, or ulcer.   ORDERS/RESULTS REVIEWED:   CBC  Recent Labs Lab 02/02/15 0655 02/07/15 0442  WBC  --  15.3*  HGB  --  15.7  HCT  --  47.5  PLT 145* 166  MCV  --  93.2  MCH  --  30.9  MCHC  --  33.1  RDW  --  15.7*  LYMPHSABS  --  PENDING  MONOABS  --  PENDING  EOSABS  --  PENDING  BASOSABS  --  PENDING   ------------------------------------------------------------------------------------------------------------------  Chemistries   Recent Labs Lab 02/04/15 0347 02/07/15 0442  NA  --  141  K  --  4.3  CL  --  99*  CO2  --  35*  GLUCOSE  --  160*  BUN  --  28*  CREATININE 0.82 1.00  CALCIUM  --  8.7*   ------------------------------------------------------------------------------------------------------------------ estimated creatinine clearance is 91.8 mL/min (by C-G formula based on Cr of 1). ------------------------------------------------------------------------------------------------------------------ No results for input(s): TSH, T4TOTAL, T3FREE, THYROIDAB in the last 72 hours.  Invalid input(s): FREET3  Cardiac Enzymes  Recent Labs Lab 01/31/15 1305 01/31/15 1531  TROPONINI <0.03 <0.03   ------------------------------------------------------------------------------------------------------------------ Invalid input(s): POCBNP ---------------------------------------------------------------------------------------------------------------  RADIOLOGY: No results found.  EKG:   Orders placed or performed during the hospital  encounter of 01/30/15  . ED EKG  . ED EKG  . EKG 12-Lead  . EKG 12-Lead    ASSESSMENT AND PLAN:  1. Acute on chronic COPD exacerbation due to bacterial pneumonia, continue patient on levofloxacin, continue Mucomyst and Mucinex 2. Chest pain likely musculoskeletal related to cough, supportive therapy, Humibid and Tussionex, improved  3. Abnormal CT scan concerning for TB, patient with intermediate QuantiFERON test, went for bronchoscopy , if AFBs negative from bronchoscopy discharge home on treatment for COPD exacerbation, pneumonia. 4. Bacterial pneumonia. C continue oral Levaquin. Leukocytosis likely secondary to steroids. 5. Hypotension , improved on IV fluids now resolved 6. Tobacco abuse. Counseling. Nicotine replacement therapy 7. Tachycardia with hypotension episode. Now resolved 8. Abnormal V/Q likely related to CT scan changes noted on CT however due to intermediate V/Q Dopplers of lower extremity check were negative  9. Anxiety continue Xanax     DRUG ALLERGIES: No Known Allergies  CODE STATUS: Full    Code Status Orders        Start     Ordered   01/31/15 0343  Full code   Continuous     01/31/15 0342    Advance Directive Documentation        Most Recent Value   Type of Advance Directive  Living will   Pre-existing out of facility DNR order (yellow form or pink MOST form)     "MOST" Form in Place?        TOTAL TIME TAKING CARE OF THIS PATIENT: .    Katha Hamming M.D on 02/07/2015 at 8:38 AM  Between 7am to 6pm - Pager - [phone]  After 6pm go to [email or web address] - password [email or web address] [facility]  [place] [facility]  Office  [phone]  CC: Primary care physician; Lanier Ensign KEY, MD

## 2015-02-08 ENCOUNTER — Inpatient Hospital Stay: Payer: Medicaid Other

## 2015-02-08 LAB — CULTURE, RESPIRATORY: SPECIAL REQUESTS: NORMAL

## 2015-02-08 LAB — CULTURE, RESPIRATORY W GRAM STAIN: Culture: NORMAL

## 2015-02-08 LAB — CULTURE, BAL-QUANTITATIVE W GRAM STAIN

## 2015-02-08 LAB — CULTURE, BAL-QUANTITATIVE: CULTURE: NORMAL

## 2015-02-08 MED ORDER — POLYVINYL ALCOHOL 1.4 % OP SOLN
1.0000 [drp] | OPHTHALMIC | Status: DC | PRN
  Administered 2015-02-08: 17:00:00 1 [drp] via OPHTHALMIC
  Filled 2015-02-08: qty 15

## 2015-02-08 NOTE — Progress Notes (Addendum)
[facility] [place] [facility]   PATIENT NAME: [name]    MR#:  [id number]  DATE OF BIRTH:  05/28/1965  SUBJECTIVE: c/o  nausea, diarrhea. Abdominal pain and bloating. CHIEF COMPLAINT:   Chief Complaint  Patient presents with  . Chest Pain     Patient's breathing is improved, states that his cough is breaking up with the Mucomyst treatment therapy.   Review of Systems  Constitutional: Negative for fever, chills and weight loss.  HENT: Negative for congestion.   Eyes: Negative for blurred vision and double vision.  Respiratory: Shortness of breath.,cough improved.   Cardiovascular: Positive chest pain, palpitations, orthopnea, leg swelling and PND.  Gastrointestinal: nausea,diarrhoea,abdominal pain  Genitourinary: Negative for dysuria, urgency, frequency and hematuria.  Musculoskeletal: Negative for falls.  Neurological: Negative for dizziness, tremors, focal weakness and headaches.  Endo/Heme/Allergies: Does not bruise/bleed easily.  Psychiatric/Behavioral: Negative for depression. The patient does not have insomnia.   positive for anxiety   VITAL SIGNS: Blood pressure 125/90, pulse 70, temperature 98.2 F (36.8 C), temperature source Oral, resp. rate 18, height 5\' 7"  (1.702 m), weight 86.818 kg (191 lb 6.4 oz), SpO2 95 %.  PHYSICAL EXAMINATION:   GENERAL:  49 y.o.-year-old patient lying in the bed with no acute distress.  EYES: Pupils equal, round, reactive to light and accommodation. No scleral icterus. Extraocular muscles intact.  HEENT: Head atraumatic, normocephalic. Oropharynx and nasopharynx clear.  NECK:  Supple, no jugular venous distention. No thyroid enlargement, no tenderness.  LUNGS. Occasional wheezing, no rales or rhonchi no crackles  CARDIOVASCULAR: S1, S2 normal. No murmurs, rubs, or gallops. Tender chest on palpation diffusely ABDOMEN: distended abdomen,poor BS EXTREMITIES: No pedal edema, cyanosis, or clubbing.   NEUROLOGIC: Cranial nerves II through XII are intact. Muscle strength 5/5 in all extremities. Sensation intact. Gait not checked.  PSYCHIATRIC: The patient is alert and oriented x 3.  SKIN: No obvious rash, lesion, or ulcer.   ORDERS/RESULTS REVIEWED:   CBC  Recent Labs Lab 02/02/15 0655 02/07/15 0442  WBC  --  15.3*  HGB  --  15.7  HCT  --  47.5  PLT 145* 166  MCV  --  93.2  MCH  --  30.9  MCHC  --  33.1  RDW  --  15.7*  LYMPHSABS  --  1.1  MONOABS  --  1.2*  EOSABS  --  0.0  BASOSABS  --  0.0   ------------------------------------------------------------------------------------------------------------------  Chemistries   Recent Labs Lab 02/04/15 0347 02/07/15 0442  NA  --  141  K  --  4.3  CL  --  99*  CO2  --  35*  GLUCOSE  --  160*  BUN  --  28*  CREATININE 0.82 1.00  CALCIUM  --  8.7*   ------------------------------------------------------------------------------------------------------------------ estimated creatinine clearance is 92 mL/min (by C-G formula based on Cr of 1). ------------------------------------------------------------------------------------------------------------------ No results for input(s): TSH, T4TOTAL, T3FREE, THYROIDAB in the last 72 hours.  Invalid input(s): FREET3  Cardiac Enzymes No results for input(s): CKMB, TROPONINI, MYOGLOBIN in the last 168 hours.  Invalid input(s): CK ------------------------------------------------------------------------------------------------------------------ Invalid input(s): POCBNP ---------------------------------------------------------------------------------------------------------------  RADIOLOGY: Ct Head Wo Contrast  02/07/2015  CLINICAL DATA:  Lightheadedness with blurred vision for 2 or 3 days. Initial encounter. EXAM: CT HEAD WITHOUT CONTRAST TECHNIQUE: Contiguous axial images were obtained from the base of the skull through the vertex without intravenous contrast. COMPARISON:   Head CT 07/04/2013 and 07/02/2013. FINDINGS: There is no evidence of acute intracranial  hemorrhage, mass lesion, brain edema or extra-axial fluid collection. The ventricles and subarachnoid spaces are mildly prominent but stable. There is no CT evidence of acute cortical infarction. There is stable mild periventricular white matter disease, likely due to chronic small vessel ischemic changes. Intracranial vascular calcifications noted. The visualized paranasal sinuses, mastoid air cells and middle ears are clear. There is stable calcification within the right external auditory canal. The calvarium is intact. IMPRESSION: Stable head CT demonstrating mild atrophy and small vessel ischemic change. No acute intracranial findings. Stable probable exostosis in the right external auditory canal. Electronically Signed   By: Carey Bullocks M.D.   On: 02/07/2015 16:13    EKG:  Orders placed or performed during the hospital encounter of 01/30/15  . ED EKG  . ED EKG  . EKG 12-Lead  . EKG 12-Lead    ASSESSMENT AND PLAN:  1. Acute on chronic COPD exacerbation due to bacterial pneumonia, continue patient on levofloxacin, continue Mucomyst and Mucinex 2. Chest pain likely musculoskeletal related to cough, supportive therapy, Humibid and Tussionex, improved  3. Abnormal CT scan concerning for TB, patient with intermediate QuantiFERON test, went for bronchoscopy , AFB is negative from Bronchososcopy,called lab and confirmed this .explained to patient.  4. Bacterial pneumonia. C continue oral Levaquin. Leukocytosis likely secondary to steroids.clinicaly better 5. Hypotension , improved on IV fluids now resolved 6. Tobacco abuse. Counseling. Nicotine replacement therapy 7. Tachycardia with hypotension episode. Now resolved 8. Abnormal V/Q likely related to CT scan changes noted on CT however due to intermediate V/Q Dopplers of lower extremity check were negative  9. Anxiety continue Xanax 10. Dizziness and  blurred vision yesterday ;head ct  Is negative,. 11. Nausea and diarrhea likely viral syndrome: If diarrhea persists we will do stool for C. difficile. Watch him for today. If symptoms improve likely discharge tomorrow.   DRUG ALLERGIES: No Known Allergies  CODE STATUS: Full    Code Status Orders        Start     Ordered   01/31/15 0343  Full code   Continuous     01/31/15 0342    Advance Directive Documentation        Most Recent Value   Type of Advance Directive  Living will   Pre-existing out of facility DNR order (yellow form or pink MOST form)     "MOST" Form in Place?        TOTAL TIME TAKING CARE OF THIS PATIENT: .    Katha Hamming M.D on 02/08/2015 at 12:23 PM  Between 7am to 6pm - Pager - [phone]  After 6pm go to [email or web address] - password [email or web address] [facility]  [place]  [facility]ospital[facility]  Office  [phone]  CC: Primary care physician; Lanier Ensign KEY, MD

## 2015-02-08 NOTE — Plan of Care (Signed)
Problem: Safety: Goal: Ability to remain free from injury will improve Outcome: Progressing High fall risk. Bed alarm on, hourly rounding. Safety camera in room maintained. Pt understands how to use call system for assistance. Safe environment provided.   Problem: Physical Regulation: Goal: Ability to maintain clinical measurements within normal limits will improve Outcome: Progressing C/o left side chest pain relieved by PRN oxycodone. VSS, afebrile. Waiting sputum culture results.

## 2015-02-08 NOTE — Plan of Care (Addendum)
Problem: Physical Regulation: Goal: Ability to maintain clinical measurements within normal limits will improve Outcome: Progressing Preliminary result of 1 AFB is negative.  Notified MD, she wasn't sure if we could take him off precautions. Airborne precautions remain. Dry cough. Patient calls out for needs. Xanax scheduled. Receiving scheduled breathing treatments. Pt to possibly discharge back to group home tomorrow. Pt c/o bilateral eye discomfort. Eyes are red, not itching, MD notified. Artificial tears ordered.

## 2015-02-09 MED ORDER — ALBUTEROL SULFATE HFA 108 (90 BASE) MCG/ACT IN AERS: 2.0000 | INHALATION_SPRAY | Freq: Four times a day (QID) | RESPIRATORY_TRACT | Status: DC | PRN

## 2015-02-09 MED ORDER — PREDNISONE 10 MG PO TABS: 10.0000 mg | ORAL_TABLET | Freq: Every day | ORAL | Status: DC

## 2015-02-09 MED ORDER — LEVOFLOXACIN 500 MG PO TABS: 500.0000 mg | ORAL_TABLET | Freq: Every day | ORAL | Status: DC

## 2015-02-09 NOTE — Progress Notes (Signed)
Patient discharged per MD orders. IV removed and in tact. All discharge instructions given and all questions answered. Group home will transport patient.

## 2015-02-09 NOTE — Plan of Care (Signed)
Problem: Education: Goal: Knowledge of [facility] Education information/materials will improve Outcome: Progressing Pt understands to call with bathroom needs.  Continues to be video monitored.  Pt understands that he can be discharged upon one more negative AFB result.  Problem: Safety: Goal: Ability to remain free from injury will improve Outcome: Progressing Pt remains free from falls.  Bed in lowest position.  Bed alarm on.  Call bell and phone within reach.

## 2015-02-09 NOTE — Clinical Social Work Note (Signed)
Clinical Social Work Assessment  Patient Details  Name: [name] MRN: [id number] Date of Birth: [birth year]  Date of referral:  02/09/15               Reason for consult:  Facility Placement                Permission sought to share information with:  Facility Art therapist granted to share information::  Yes, Verbal Permission Granted  Name::     Ms. Annette Stable, admin at facility  Housing/Transportation Living arrangements for the past 2 months:  [place] of Information:  Patient Patient Interpreter Needed:  None Criminal Activity/Legal Involvement Pertinent to Current Situation/Hospitalization:  No - Comment as needed Significant Relationships:  Friend Lives with:  Self Do you feel safe going back to the place where you live?  Yes Need for family participation in patient care:  No (Coment)  Care giving concerns:  No care giving concerns identified.    Social Worker assessment / plan:  CSW met with pt to address consult for pt as he was admitted from [facility]. CSW introduced herself and explained role of social work. CSW also explained the process of returning to facility. Pt was agreeable to returning to rest home. CSW updated facility, who will provide transportation to faciltiy. CSW also updated RN. CSW is signing off as no further needs identified.   Employment status:  Disabled (Comment on whether or not currently receiving Disability) Insurance information:   Medicaid PT Recommendations:  No Follow Up Information / Referral to community resources:  Other (Comment Required) (Facility )  Patient/Family's Response to care:  Pt was agreeable to discharge plan and appreciative of CSW support.  Patient/Family's Understanding of and Emotional Response to Diagnosis, Current Treatment, and Prognosis:  Pt understands that pt is to return to facility at discharge.   Emotional Assessment Appearance:  Appears stated age,  Well-Groomed Attitude/Demeanor/Rapport:  Other (Apppriorate) Affect (typically observed):  Accepting Orientation:  Oriented to Self, Oriented to Place, Oriented to  Time, Oriented to Situation Alcohol / Substance use:  Never Used Psych involvement (Current and /or in the community):  No (Comment)  Discharge Needs  Concerns to be addressed:  No discharge needs identified Readmission within the last 30 days:  Yes Current discharge risk:  None Barriers to Discharge:  No Barriers Identified   Darden Dates, LCSW 02/09/2015, 1:44 PM

## 2015-02-09 NOTE — Discharge Summary (Signed)
[name], is a 49 y.o. male  DOB 01/17/1966  MRN [id number].  Admission date:  01/30/2015  Admitting Physician  Arnaldo NatalMichael S Diamond, MD  Discharge Date:  02/09/2015   Primary MD  Lanier EnsignSOLES, MEREDITH KEY, MD  Recommendations for primary care physician for things to follow:  Follow-up with primary doctor in 1 week.   Admission Diagnosis  Chest pain, unspecified chest pain type [R07.9]   Discharge Diagnosis  Chest pain, unspecified chest pain type [R07.9]    Active Problems:   Chest pain   Pneumonia      Past Medical History  Diagnosis Date  . Polysubstance abuse   . Bipolar disorder (HCC)   . Esophageal stricture   . H/O tonic-clonic seizures   . Anxiety   . GERD (gastroesophageal reflux disease)   . Chronic pain   . Asthma   . Hypertension     Past Surgical History  Procedure Laterality Date  . Appendectomy      complicated by perforation and abscess  . Peg w/tracheostomy placement    . Peg tube removal    . Tracheostomy closure    . Esophagogastroduodenoscopy (egd) with esophageal dilation    . Hernia repair  Nov 2016    [facility] Genene ChurnBunzendahl       History of present illness and  Hospital Course:     Kindly see H&P for history of present illness and admission details, please review complete Labs, Consult reports and Test reports for all details in brief  HPI  from the history and physical done on the day of admission 49 year old male patient admitted for chest pain. Patient was here recently number first week that time chest pain work up was negative after Lexiscan so we but discharge him, but chest CAT scan that time was concerning for some inflammatory changes and possible miliary TB so we admitted him to respiratory isolation to evaluate for TB.   Hospital Course    1  chest pain likely secondary to pneumonia: Patient received Levaquin, nebulizers. #2 COPD exacerbation and bacterial pneumonia continued on nebulizers and, Levaquin. #3 possible TB consulted ID, pulmonary. Patient did have quantiferon gold test , the results was indeterminate because of that we continued him on airborne isolation. He was taken to bronchoscopy and bronchial alveolar lavage. Patient did right upper lobe BAL sent for AFB, pneumocystis, bacterial studies, cytology. [facility] called me and said AFB test is negative. I did speak with Dr. Sampson GoonFitzgerald who recommended to DC isolation and discharge the patient with antibiotics for pneumonia. Patient has less wheezing, less cough. Discharging with tapering   prednisone.  2. Hypertension improved with IV hydration. #3 anxiety, right colon disorder continue home medications. GERD continue PPIs. Hypertension controlled. I advised to quit smoking.  Discharge Condition: Stable   Follow UP  Follow-up Information    Follow up with Harold HedgeFATH,KENNETH A., MD. Call in 1 day.   Specialty:  Cardiology   Why:  For follow-up   Contact information:   [address] [facility] [place] [address] [phone]         Discharge Instructions  and  Discharge Medications        Medication List    STOP taking these medications        HYDROcodone-acetaminophen 5-325 MG tablet  Commonly known as:  NORCO/VICODIN      TAKE these medications        albuterol 108 (90  BASE) MCG/ACT inhaler  Commonly known as:  PROVENTIL HFA;VENTOLIN HFA  Inhale 2 puffs into the lungs every 6 (six) hours as needed for wheezing or shortness of breath.     atorvastatin 10 MG tablet  Commonly known as:  LIPITOR  Take 10 mg by mouth every evening.     benzonatate 200 MG capsule  Commonly known as:  TESSALON  Take 1 capsule (200 mg total) by mouth 3 (three) times daily as needed for cough.     busPIRone 15 MG tablet  Commonly known  as:  BUSPAR  Take 1 tablet (15 mg total) by mouth 3 (three) times daily.     clonazePAM 0.5 MG tablet  Commonly known as:  KLONOPIN  Take 1 tablet (0.5 mg total) by mouth 3 (three) times daily as needed.     diltiazem 30 MG tablet  Commonly known as:  CARDIZEM  Take 1 tablet (30 mg total) by mouth 4 (four) times daily.     FLUoxetine 20 MG capsule  Commonly known as:  PROZAC  Take 20 mg by mouth 2 (two) times daily.     fluticasone 50 MCG/ACT nasal spray  Commonly known as:  FLONASE  Place 2 sprays into both nostrils daily.     gabapentin 300 MG capsule  Commonly known as:  NEURONTIN  Take 1 capsule (300 mg total) by mouth 2 (two) times daily.     levofloxacin 500 MG tablet  Commonly known as:  LEVAQUIN  Take 1 tablet (500 mg total) by mouth daily.     loratadine 10 MG tablet  Commonly known as:  CLARITIN  Take 10 mg by mouth daily.     metaxalone 800 MG tablet  Commonly known as:  SKELAXIN  Take 1 tablet (800 mg total) by mouth 3 (three) times daily.     metoprolol succinate 50 MG 24 hr tablet  Commonly known as:  TOPROL-XL  Take 50 mg by mouth daily.     pantoprazole 40 MG tablet  Commonly known as:  PROTONIX  Take 1 tablet (40 mg total) by mouth daily.     predniSONE 10 MG tablet  Commonly known as:  DELTASONE  Take 1 tablet (10 mg total) by mouth daily with breakfast.  Start taking on:  02/10/2015     venlafaxine XR 150 MG 24 hr capsule  Commonly known as:  EFFEXOR-XR  Take 1 capsule (150 mg total) by mouth daily with breakfast.          Diet and Activity recommendation: See Discharge Instructions above   Consults obtained - ID, pulmonary   Major procedures and Radiology Reports - PLEASE review detailed and final reports for all details, in brief -      Dg Chest 2 View  01/18/2015  CLINICAL DATA:  Chest pain and anxiety attacks. Shortness of breath and cough. EXAM: CHEST  2 VIEW COMPARISON:  01/01/2015 FINDINGS: Normal heart size and pulmonary  vascularity. Diffuse interstitial pattern in the lungs may represent chronic fibrosis or acute interstitial edema or acute interstitial pneumonitis. Appearance is similar to previous study. No developing consolidation or airspace disease. No blunting of costophrenic angles. No pneumothorax. Mild hyperinflation suggesting emphysema. Degenerative changes in the spine. Mediastinal contours appear intact. Calcified granulomas in the upper lungs. IMPRESSION: Nonspecific interstitial pattern to the lungs may indicate chronic fibrosis or acute edema/pneumonitis. Emphysematous changes. No acute consolidation. Electronically Signed   By: Burman Nieves M.D.   On: 01/18/2015 20:39   Dg Abd  1 View  02/08/2015  CLINICAL DATA:  Abdominal pain EXAM: ABDOMEN - 1 VIEW COMPARISON:  01/30/2015 FINDINGS: Scattered large and small bowel gas is noted. Fecal material is noted throughout the colon. No free air is seen. No abnormal mass or abnormal calcifications are noted. Degenerative change of the lumbar spine is seen. IMPRESSION: No acute abnormality noted. Electronically Signed   By: Alcide Clever M.D.   On: 02/08/2015 14:03   Ct Head Wo Contrast  02/07/2015  CLINICAL DATA:  Lightheadedness with blurred vision for 2 or 3 days. Initial encounter. EXAM: CT HEAD WITHOUT CONTRAST TECHNIQUE: Contiguous axial images were obtained from the base of the skull through the vertex without intravenous contrast. COMPARISON:  Head CT 07/04/2013 and 07/02/2013. FINDINGS: There is no evidence of acute intracranial hemorrhage, mass lesion, brain edema or extra-axial fluid collection. The ventricles and subarachnoid spaces are mildly prominent but stable. There is no CT evidence of acute cortical infarction. There is stable mild periventricular white matter disease, likely due to chronic small vessel ischemic changes. Intracranial vascular calcifications noted. The visualized paranasal sinuses, mastoid air cells and middle ears are clear. There  is stable calcification within the right external auditory canal. The calvarium is intact. IMPRESSION: Stable head CT demonstrating mild atrophy and small vessel ischemic change. No acute intracranial findings. Stable probable exostosis in the right external auditory canal. Electronically Signed   By: Carey Bullocks M.D.   On: 02/07/2015 16:13   Ct Chest Wo Contrast  02/02/2015  CLINICAL DATA:  49 year old male with history of cirrhosis. Question miliary pulmonary disease on prior CT.Has not been losing wt but has night sweats, no fevers. He does have a cough but no hemoptysis. EXAM: CT CHEST WITHOUT CONTRAST TECHNIQUE: Multidetector CT imaging of the chest was performed following the standard protocol without IV contrast. COMPARISON:  CT 01/19/2015 FINDINGS: Mediastinum/Nodes: No axillary supraclavicular lymphadenopathy. Calcified mediastinal lymph nodes not changed from prior. Largest lymph node is a subcarinal lymph node measuring 20 mm short axis. No pericardial fluid. Esophagus is normal. Lungs/Pleura: The fine nodular pattern in the upper lobes is again demonstrated not significantly changed from CT of 01/19/2015. No new nodularity. No new airspace disease, effusion, or infiltrate. Central airways are normal. Upper abdomen: Limited view of the liver, kidneys, pancreas are unremarkable. Normal adrenal glands. Musculoskeletal: No aggressive osseous lesion. IMPRESSION: 1. No significant change in fine nodular pattern in the upper lobes compared to CT of 01/19/2015. Differential remains atypical infectious process versus chronic granulomas disease. 2. Stable calcified mediastinal lymph nodes. Electronically Signed   By: Genevive Bi M.D.   On: 02/02/2015 17:25   Ct Angio Chest Pe W/cm &/or Wo Cm  01/19/2015  CLINICAL DATA:  Left-sided chest pain, headache, and blurred vision. Nausea and abdominal pain. EXAM: CT ANGIOGRAPHY CHEST WITH CONTRAST TECHNIQUE: Multidetector CT imaging of the chest was  performed using the standard protocol during bolus administration of intravenous contrast. Multiplanar CT image reconstructions and MIPs were obtained to evaluate the vascular anatomy. CONTRAST:  OMNIPAQUE IOHEXOL 350 MG/ML SOLN COMPARISON:  06/24/2013 FINDINGS: Technically adequate study with good opacification of the central and segmental pulmonary arteries. No focal filling defects demonstrated. No evidence of significant pulmonary embolus. Normal heart size. Normal caliber thoracic aorta. No aortic dissection. Great vessel origins are patent. Enlarged lymph nodes demonstrated throughout the mediastinum and in both hila. Largest right paratracheal lymph node measures about 1.8 cm short axis dimension. Subcarinal lymph nodes measure up to 2.2 cm short axis dimension.  Scattered calcifications are demonstrated throughout the lymph nodes. This suggest probable postinflammatory change. Lymphadenopathy was present on the previous study is well. Esophagus is decompressed. Calcified granulomas in the lungs. Fine reticular nodular pattern to the lungs is new since previous study. This could indicate interstitial edema or interstitial pneumonitis. Given presence of calcifications, consider possible reactivation miliary TB. No focal consolidation. No pleural effusions. No pneumothorax. Included portions of the upper abdominal organs are grossly unremarkable. Degenerative changes in the spine. No destructive bone lesions. Review of the MIP images confirms the above findings. IMPRESSION: Enlarged calcified lymph nodes in the hila and mediastinum consistent with postinflammatory etiology. Calcified granulomas in the lungs. Knee reticular nodular interstitial changes suggest interstitial edema or interstitial pneumonia. Consider possible reactivation TB. No evidence of significant pulmonary embolus. Electronically Signed   By: Burman Nieves M.D.   On: 01/19/2015 01:13   Ct Abdomen Pelvis W Contrast  01/30/2015   CLINICAL DATA:  Acute onset of left-sided chest pain and epigastric abdominal pain. Initial encounter. EXAM: CT ABDOMEN AND PELVIS WITH CONTRAST TECHNIQUE: Multidetector CT imaging of the abdomen and pelvis was performed using the standard protocol following bolus administration of intravenous contrast. CONTRAST:  OMNIPAQUE IOHEXOL 300 MG/ML  SOLN COMPARISON:  CT of the abdomen and pelvis performed 05/27/2014 FINDINGS: Minimal bibasilar atelectasis is noted. The liver and spleen are unremarkable in appearance. The gallbladder is within normal limits. Minimal calcification within the pancreas may reflect sequelae of chronic pancreatitis. The pancreas and adrenal glands are otherwise unremarkable. The kidneys are unremarkable in appearance. There is no evidence of hydronephrosis. No renal or ureteral stones are seen. Mild nonspecific perinephric stranding is noted bilaterally. No free fluid is identified. The small bowel is unremarkable in appearance. The stomach is within normal limits. No acute vascular abnormalities are seen. Scattered calcification is noted along the abdominal aorta and its branches. The patient is status post appendectomy. Mild diverticulosis is noted along the proximal sigmoid colon, without evidence of diverticulitis. The bladder is mildly distended and grossly unremarkable. The prostate remains normal in size. No inguinal lymphadenopathy is seen. No acute osseous abnormalities are identified. IMPRESSION: 1. No acute abnormality seen within the abdomen or pelvis. 2. Minimal calcification within the pancreas may reflect sequelae of chronic pancreatitis. 3. Scattered calcification along the abdominal aorta and its branches. 4. Mild diverticulosis along the proximal sigmoid colon, without evidence of diverticulitis. Electronically Signed   By: Roanna Raider M.D.   On: 01/30/2015 22:31   Nm Myocar Multi W/spect W/wall Motion / Ef  01/20/2015   There was no ST segment deviation noted  during stress.  The study is normal.  This is a low risk study.  The left ventricular ejection fraction is normal (55-65%).  Normal sestamibi. Normal ef with no evidence of ischemia   Nm Pulmonary Perf And Vent  02/03/2015  CLINICAL DATA:  Chest pain and shortness of breath. EXAM: NUCLEAR MEDICINE VENTILATION - PERFUSION LUNG SCAN TECHNIQUE: Ventilation images were obtained in multiple projections using inhaled aerosol Tc-9m DTPA. Perfusion images were obtained in multiple projections after intravenous injection of Tc-97m MAA. RADIOPHARMACEUTICALS:  32.77 Technetium-19m DTPA aerosol inhalation and 4.113 Technetium-20m MAA IV COMPARISON:  Chest CT 02/02/2015 and chest radiograph 01/30/2015 FINDINGS: Ventilation: Extensive clumping of the radiopharmaceutical throughout both lungs with a patchy distribution. Perfusion: Scattered perfusion defects in both lungs. Most of the perfusion defects match with ventilation defects. There is concern for mismatches in the hilar regions bilaterally. Mismatches in the hilar regions could  be related to clumping of the radiopharmaceutical on the ventilation images. The more peripheral perfusion abnormalities are matched defects. IMPRESSION: Multiple matched defects throughout both lungs. There are mismatches involving the central aspects of both lungs but this could be secondary to clumping on the ventilation images. Intermediate probability for pulmonary embolism. Consider further evaluation with a chest CTA or lower extremity venous duplex to evaluate for venous thromboembolic disease. Electronically Signed   By: Richarda Overlie M.D.   On: 02/03/2015 11:32   US Venous Img Lower Bilateral  02/03/2015  CLINICAL DATA:  Short of breath.  History of DVT. EXAM: BILATERAL LOWER EXTREMITY VENOUS DOPPLER ULTRASOUND TECHNIQUE: Gray-scale sonography with graded compression, as well as color Doppler and duplex ultrasound were performed to evaluate the lower extremity deep venous systems  from the level of the common femoral vein and including the common femoral, femoral, profunda femoral, popliteal and calf veins including the posterior tibial, peroneal and gastrocnemius veins when visible. The superficial great saphenous vein was also interrogated. Spectral Doppler was utilized to evaluate flow at rest and with distal augmentation maneuvers in the common femoral, femoral and popliteal veins. COMPARISON:  07/14/2013 FINDINGS: RIGHT LOWER EXTREMITY Common Femoral Vein: No evidence of thrombus. Normal compressibility, respiratory phasicity and response to augmentation. Saphenofemoral Junction: No evidence of thrombus. Normal compressibility and flow on color Doppler imaging. Profunda Femoral Vein: No evidence of thrombus. Normal compressibility and flow on color Doppler imaging. Femoral Vein: No evidence of thrombus. Normal compressibility, respiratory phasicity and response to augmentation. Popliteal Vein: No evidence of thrombus. Normal compressibility, respiratory phasicity and response to augmentation. Calf Veins: No evidence of thrombus. Normal compressibility and flow on color Doppler imaging. Superficial Great Saphenous Vein: No evidence of thrombus. Normal compressibility and flow on color Doppler imaging. Venous Reflux:  None. Other Findings:  None. LEFT LOWER EXTREMITY Common Femoral Vein: No evidence of thrombus. Normal compressibility, respiratory phasicity and response to augmentation. Saphenofemoral Junction: No evidence of thrombus. Normal compressibility and flow on color Doppler imaging. Profunda Femoral Vein: No evidence of thrombus. Normal compressibility and flow on color Doppler imaging. Femoral Vein: No evidence of thrombus. Normal compressibility, respiratory phasicity and response to augmentation. Popliteal Vein: No evidence of thrombus. Normal compressibility, respiratory phasicity and response to augmentation. Calf Veins: No evidence of thrombus. Normal compressibility and  flow on color Doppler imaging. Superficial Great Saphenous Vein: No evidence of thrombus. Normal compressibility and flow on color Doppler imaging. Venous Reflux:  None. Other Findings:  None. IMPRESSION: No evidence of deep venous thrombosis. Electronically Signed   By: Amie Portland M.D.   On: 02/03/2015 15:07   Dg Chest Portable 1 View  01/30/2015  CLINICAL DATA:  Left-sided chest pain with anxiety and heartburn 3 days. EXAM: PORTABLE CHEST 1 VIEW COMPARISON:  01/18/2015, 05/20/2014 and chest CT 01/19/2015, CT 07/22/2013, 06/13/2013 FINDINGS: Lungs are adequately inflated without focal consolidation or effusion. There is continued bilateral interstitial prominence without significant change likely a chronic atypical infectious or inflammatory process. More discrete 8 mm nodule over the right midlung lateral to the hilum unchanged. Cardiomediastinal silhouette and remainder of the exam is unchanged. IMPRESSION: Chronic bilateral interstitial process likely an atypical infectious or inflammatory process. 8 mm nodular density lateral to the right hilum unchanged from CT 06/13/2013. Recommend 1 additional follow-up chest CT in 6 months to document 2 years of stability. This recommendation follows the consensus statement: Guidelines for Management of Small Pulmonary Nodules Detected on CT Scans: A Statement from the Fleischner Society as  published in Radiology 2005; 237:395-400. Online at: [email or web address].cz. Electronically Signed   By: Elberta Fortis M.D.   On: 01/30/2015 20:43   Dg Abd 2 Views  01/14/2015  CLINICAL DATA:  Abdominal discomfort, psychiatric evaluation EXAM: ABDOMEN - 2 VIEW COMPARISON:  01/01/2015 FINDINGS: Nonobstructive bowel gas pattern. No evidence of free air under the diaphragm on the upright view. Degenerative changes of the lumbar spine. IMPRESSION: No evidence of small bowel obstruction or free air. Electronically Signed   By: Charline Bills M.D.    On: 01/14/2015 14:44    Micro Results     Recent Results (from the past 240 hour(s))  MRSA PCR Screening     Status: None   Collection Time: 01/31/15  4:30 AM  Result Value Ref Range Status   MRSA by PCR NEGATIVE NEGATIVE Final    Comment:        The GeneXpert MRSA Assay (FDA approved for NASAL specimens only), is one component of a comprehensive MRSA colonization surveillance program. It is not intended to diagnose MRSA infection nor to guide or monitor treatment for MRSA infections.   Culture, expectorated sputum-assessment     Status: None   Collection Time: 02/05/15  9:55 AM  Result Value Ref Range Status   Specimen Description EXPECTORATED SPUTUM  Final   Special Requests Normal  Final   Sputum evaluation THIS SPECIMEN IS ACCEPTABLE FOR SPUTUM CULTURE  Final   Report Status 02/05/2015 FINAL  Final  Culture, respiratory (NON-Expectorated)     Status: None   Collection Time: 02/05/15  9:55 AM  Result Value Ref Range Status   Specimen Description EXPECTORATED SPUTUM  Final   Special Requests Normal Reflexed from [id number]  Final   Gram Stain   Final    FEW WBC SEEN FEW GRAM POSITIVE COCCI FEW GRAM POSITIVE RODS GOOD SPECIMEN - 80-90% WBCS    Culture Consistent with normal respiratory flora.  Final   Report Status 02/08/2015 FINAL  Final  Culture, bal-quantitative     Status: None   Collection Time: 02/06/15  1:39 PM  Result Value Ref Range Status   Specimen Description BRONCHIAL ALVEOLAR LAVAGE  Final   Special Requests NONE  Final   Gram Stain   Final    FEW WBC SEEN FEW GRAM POSITIVE COCCI FEW GRAM POSITIVE RODS GOOD SPECIMEN - 80-90% WBCS    Culture Consistent with normal respiratory flora.  Final   Report Status 02/08/2015 FINAL  Final  Fungus Culture with Smear     Status: None (Preliminary result)   Collection Time: 02/06/15  1:39 PM  Result Value Ref Range Status   Specimen Description BRONCHIAL ALVEOLAR LAVAGE  Final   Special Requests NONE  Final    Culture YEAST ISOLATED;ID TO FOLLOW  Final   Report Status PENDING  Incomplete       Today   Subjective:   [name] today has no headache,no chest abdominal pain,no new weakness tingling or numbness, feels much better ,stable for discharge.  Objective:   Blood pressure 99/65, pulse 66, temperature 98.4 F (36.9 C), temperature source Oral, resp. rate 18, height 5\' 7"  (1.702 m), weight 88.179 kg (194 lb 6.4 oz), SpO2 95 %.   Intake/Output Summary (Last 24 hours) at 02/09/15 1148 Last data filed at 02/08/15 1648  Gross per 24 hour  Intake      0 ml  Output    650 ml  Net   -650 ml    Exam Awake Alert, Oriented x  3, No new F.N deficits, Normal affect [place].AT,PERRAL Supple Neck,No JVD, No cervical lymphadenopathy appriciated.  Symmetrical Chest wall movement, Good air movement bilaterally, CTAB RRR,No Gallops,Rubs or new Murmurs, No Parasternal Heave +ve B.Sounds, Abd Soft, Non tender, No organomegaly appriciated, No rebound -guarding or rigidity. No Cyanosis, Clubbing or edema, No new Rash or bruise  Data Review   CBC w Diff: Lab Results  Component Value Date   WBC 15.3* 02/07/2015   WBC 9.8 01/13/2014   HGB 15.7 02/07/2015   HGB 15.7 01/13/2014   HCT 47.5 02/07/2015   HCT 48.3 01/13/2014   PLT 166 02/07/2015   PLT 200 01/13/2014   LYMPHOPCT 7 02/07/2015   LYMPHOPCT 20.7 01/05/2014   MONOPCT 8 02/07/2015   MONOPCT 10.6 01/05/2014   EOSPCT 0 02/07/2015   EOSPCT 2.8 01/05/2014   BASOPCT 0 02/07/2015   BASOPCT 0.9 01/05/2014    CMP: Lab Results  Component Value Date   NA 141 02/07/2015   NA 141 01/13/2014   K 4.3 02/07/2015   K 4.0 01/13/2014   CL 99* 02/07/2015   CL 105 01/13/2014   CO2 35* 02/07/2015   CO2 24 01/13/2014   BUN 28* 02/07/2015   BUN 15 01/13/2014   CREATININE 1.00 02/07/2015   CREATININE 0.81 01/13/2014   PROT 7.2 01/30/2015   PROT 9.2* 01/13/2014   ALBUMIN 3.4* 01/30/2015   ALBUMIN 3.8 01/13/2014   BILITOT 0.3 01/30/2015    BILITOT 0.5 01/13/2014   ALKPHOS 80 01/30/2015   ALKPHOS 81 01/13/2014   AST 18 01/30/2015   AST 30 01/13/2014   ALT 14* 01/30/2015   ALT 19 01/13/2014  .   Total Time in preparing paper work, data evaluation and todays exam - 35 minutes  Zakhia Seres M.D on 02/09/2015 at 11:48 AM    Note: This dictation was prepared with Dragon dictation along with smaller phrase technology. Any transcriptional errors that result from this process are unintentional.

## 2015-02-12 LAB — CYTOLOGY - NON PAP

## 2015-02-27 LAB — FUNGUS CULTURE W SMEAR

## 2015-03-20 LAB — ACID FAST CULTURE WITH REFLEXED SENSITIVITIES (MYCOBACTERIA)
Acid Fast Culture: NEGATIVE
Acid Fast Smear: NEGATIVE

## 2015-03-21 LAB — ACID FAST SMEAR+CULTURE W/RFLX (ARMC ONLY)
ACID FAST CULTURE: NEGATIVE
ACID FAST SMEAR: NEGATIVE

## 2015-07-17 ENCOUNTER — Emergency Department
Admission: EM | Admit: 2015-07-17 | Discharge: 2015-07-18 | Disposition: A | Discharge status: Home / Self Care | Payer: Medicaid Other | Attending: Emergency Medicine | Admitting: Emergency Medicine

## 2015-07-17 ENCOUNTER — Encounter: Payer: Self-pay | Admitting: Emergency Medicine

## 2015-07-17 DIAGNOSIS — Z792 Long term (current) use of antibiotics: Secondary | ICD-10-CM | POA: Diagnosis not present

## 2015-07-17 DIAGNOSIS — F29 Unspecified psychosis not due to a substance or known physiological condition: Secondary | ICD-10-CM

## 2015-07-17 DIAGNOSIS — I10 Essential (primary) hypertension: Secondary | ICD-10-CM | POA: Diagnosis not present

## 2015-07-17 DIAGNOSIS — Z79899 Other long term (current) drug therapy: Secondary | ICD-10-CM | POA: Insufficient documentation

## 2015-07-17 DIAGNOSIS — F1290 Cannabis use, unspecified, uncomplicated: Secondary | ICD-10-CM | POA: Insufficient documentation

## 2015-07-17 DIAGNOSIS — K219 Gastro-esophageal reflux disease without esophagitis: Secondary | ICD-10-CM | POA: Diagnosis present

## 2015-07-17 DIAGNOSIS — J45909 Unspecified asthma, uncomplicated: Secondary | ICD-10-CM | POA: Diagnosis not present

## 2015-07-17 DIAGNOSIS — R45851 Suicidal ideations: Secondary | ICD-10-CM | POA: Insufficient documentation

## 2015-07-17 DIAGNOSIS — F17210 Nicotine dependence, cigarettes, uncomplicated: Secondary | ICD-10-CM | POA: Diagnosis not present

## 2015-07-17 DIAGNOSIS — F319 Bipolar disorder, unspecified: Secondary | ICD-10-CM

## 2015-07-17 DIAGNOSIS — F310 Bipolar disorder, current episode hypomanic: Secondary | ICD-10-CM | POA: Diagnosis not present

## 2015-07-17 DIAGNOSIS — E785 Hyperlipidemia, unspecified: Secondary | ICD-10-CM | POA: Diagnosis not present

## 2015-07-17 LAB — URINE DRUG SCREEN, QUALITATIVE (ARMC ONLY)
Amphetamines, Ur Screen: NOT DETECTED
BARBITURATES, UR SCREEN: NOT DETECTED
Benzodiazepine, Ur Scrn: NOT DETECTED
CANNABINOID 50 NG, UR ~~LOC~~: NOT DETECTED
Cocaine Metabolite,Ur ~~LOC~~: NOT DETECTED
MDMA (ECSTASY) UR SCREEN: POSITIVE — AB
Methadone Scn, Ur: NOT DETECTED
Opiate, Ur Screen: NOT DETECTED
Phencyclidine (PCP) Ur S: NOT DETECTED
TRICYCLIC, UR SCREEN: NOT DETECTED

## 2015-07-17 LAB — COMPREHENSIVE METABOLIC PANEL
ALT: 16 U/L — AB (ref 17–63)
ANION GAP: 9 (ref 5–15)
AST: 19 U/L (ref 15–41)
Albumin: 4.3 g/dL (ref 3.5–5.0)
Alkaline Phosphatase: 56 U/L (ref 38–126)
BUN: 14 mg/dL (ref 6–20)
CHLORIDE: 101 mmol/L (ref 101–111)
CO2: 31 mmol/L (ref 22–32)
CREATININE: 1.17 mg/dL (ref 0.61–1.24)
Calcium: 9.4 mg/dL (ref 8.9–10.3)
Glucose, Bld: 98 mg/dL (ref 65–99)
Potassium: 4.4 mmol/L (ref 3.5–5.1)
Sodium: 141 mmol/L (ref 135–145)
Total Bilirubin: 0.5 mg/dL (ref 0.3–1.2)
Total Protein: 7.8 g/dL (ref 6.5–8.1)

## 2015-07-17 LAB — CBC
HCT: 44.4 % (ref 40.0–52.0)
Hemoglobin: 14.8 g/dL (ref 13.0–18.0)
MCH: 30.3 pg (ref 26.0–34.0)
MCHC: 33.2 g/dL (ref 32.0–36.0)
MCV: 91 fL (ref 80.0–100.0)
PLATELETS: 186 10*3/uL (ref 150–440)
RBC: 4.88 MIL/uL (ref 4.40–5.90)
RDW: 15.1 % — ABNORMAL HIGH (ref 11.5–14.5)
WBC: 7.8 10*3/uL (ref 3.8–10.6)

## 2015-07-17 LAB — ETHANOL

## 2015-07-17 LAB — SALICYLATE LEVEL

## 2015-07-17 LAB — ACETAMINOPHEN LEVEL: Acetaminophen (Tylenol), Serum: <10 ug/mL — ABNORMAL LOW (ref 10–30)

## 2015-07-17 MED ORDER — METOPROLOL SUCCINATE ER 50 MG PO TB24
50.0000 mg | ORAL_TABLET | Freq: Every day | ORAL | Status: DC
  Administered 2015-07-17 – 2015-07-18 (×2): 50 mg via ORAL
  Filled 2015-07-17 (×2): qty 1

## 2015-07-17 MED ORDER — CLONAZEPAM 0.5 MG PO TABS
0.5000 mg | ORAL_TABLET | Freq: Three times a day (TID) | ORAL | Status: DC
  Administered 2015-07-18 (×2): 0.5 mg via ORAL
  Filled 2015-07-17 (×2): qty 1

## 2015-07-17 MED ORDER — DILTIAZEM HCL 30 MG PO TABS
30.0000 mg | ORAL_TABLET | Freq: Three times a day (TID) | ORAL | Status: DC
  Administered 2015-07-18 (×2): 30 mg via ORAL
  Filled 2015-07-17 (×3): qty 1

## 2015-07-17 MED ORDER — QUETIAPINE FUMARATE 25 MG PO TABS
100.0000 mg | ORAL_TABLET | Freq: Every day | ORAL | Status: DC
  Administered 2015-07-18: 100 mg via ORAL
  Filled 2015-07-17: qty 4

## 2015-07-17 MED ORDER — ALBUTEROL SULFATE HFA 108 (90 BASE) MCG/ACT IN AERS
2.0000 | INHALATION_SPRAY | RESPIRATORY_TRACT | Status: DC | PRN
  Filled 2015-07-17: qty 6.7

## 2015-07-17 MED ORDER — IBUPROFEN 600 MG PO TABS
ORAL_TABLET | ORAL | Status: AC
  Filled 2015-07-17: qty 1

## 2015-07-17 MED ORDER — ATORVASTATIN CALCIUM 20 MG PO TABS: 10.0000 mg | ORAL_TABLET | Freq: Every day | ORAL | Status: DC

## 2015-07-17 MED ORDER — LORATADINE 10 MG PO TABS
10.0000 mg | ORAL_TABLET | Freq: Every day | ORAL | Status: DC
  Administered 2015-07-17 – 2015-07-18 (×2): 10 mg via ORAL
  Filled 2015-07-17 (×2): qty 1

## 2015-07-17 MED ORDER — IBUPROFEN 600 MG PO TABS
600.0000 mg | ORAL_TABLET | Freq: Once | ORAL | Status: AC
  Administered 2015-07-17: 600 mg via ORAL

## 2015-07-17 MED ORDER — FLUOXETINE HCL 20 MG PO CAPS
20.0000 mg | ORAL_CAPSULE | Freq: Every day | ORAL | Status: DC
  Administered 2015-07-17 – 2015-07-18 (×2): 20 mg via ORAL
  Filled 2015-07-17 (×2): qty 1

## 2015-07-17 NOTE — ED Provider Notes (Signed)
[facility] Emergency Department Provider Note   ____________________________________________  Time seen: Approximately 4 PM  I have reviewed the triage vital signs and the nursing notes.   HISTORY  Chief Complaint Suicidal    HPI [name] is a 50 y.o. male with a history of polysubstance abuse and bipolar disorder who is presenting to the emergency department with suicidal ideation. He says that he has not taken his meds since March and that he has had worsening suicidal thoughts to "blow his brains out." He does not have access to a firearm but says that he would act on this urge if he did. He says that he is also having auditory hallucinations, hearing "strange noises." He does not hear any voices.   Past Medical History  Diagnosis Date  . Polysubstance abuse   . Bipolar disorder (HCC)   . Esophageal stricture   . H/O tonic-clonic seizures   . Anxiety   . GERD (gastroesophageal reflux disease)   . Chronic pain   . Asthma   . Hypertension     Patient Active Problem List   Diagnosis Date Noted  . Pneumonia 02/05/2015  . Chest pain 01/31/2015  . Left sided chest pain 01/19/2015  . Adjustment disorder with depressed mood 01/15/2015  . Dyslipidemia 01/03/2015  . Tobacco use disorder 01/03/2015  . Major depressive disorder, recurrent severe without psychotic features (HCC) 01/02/2015  . Hypertension 01/02/2015  . Gastric reflux 01/02/2015    Past Surgical History  Procedure Laterality Date  . Appendectomy      complicated by perforation and abscess  . Peg w/tracheostomy placement    . Peg tube removal    . Tracheostomy closure    . Esophagogastroduodenoscopy (egd) with esophageal dilation    . Hernia repair  Nov 2016    [facility]ral hernia repair at [facility] by Dr. Genene Churn    Current Outpatient Rx  Name  Route  Sig  Dispense  Refill  . albuterol (PROVENTIL HFA;VENTOLIN HFA) 108 (90 BASE) MCG/ACT inhaler   Inhalation   Inhale 2  puffs into the lungs every 6 (six) hours as needed for wheezing or shortness of breath.   1 Inhaler   2   . atorvastatin (LIPITOR) 10 MG tablet   Oral   Take 10 mg by mouth every evening.         . benzonatate (TESSALON) 200 MG capsule   Oral   Take 1 capsule (200 mg total) by mouth 3 (three) times daily as needed for cough.   20 capsule   0   . busPIRone (BUSPAR) 15 MG tablet   Oral   Take 1 tablet (15 mg total) by mouth 3 (three) times daily.   90 tablet   0   . clonazePAM (KLONOPIN) 0.5 MG tablet   Oral   Take 1 tablet (0.5 mg total) by mouth 3 (three) times daily as needed. Patient taking differently: Take 0.5 mg by mouth 3 (three) times daily as needed for anxiety.    30 tablet   0   . diltiazem (CARDIZEM) 30 MG tablet   Oral   Take 1 tablet (30 mg total) by mouth 4 (four) times daily.   120 tablet   0   . FLUoxetine (PROZAC) 20 MG capsule   Oral   Take 20 mg by mouth 2 (two) times daily.         . fluticasone (FLONASE) 50 MCG/ACT nasal spray   Each Nare   Place  2 sprays into both nostrils daily.         Marland Kitchen gabapentin (NEURONTIN) 300 MG capsule   Oral   Take 1 capsule (300 mg total) by mouth 2 (two) times daily.   60 capsule   0   . levofloxacin (LEVAQUIN) 500 MG tablet   Oral   Take 1 tablet (500 mg total) by mouth daily.   3 tablet   0   . loratadine (CLARITIN) 10 MG tablet   Oral   Take 10 mg by mouth daily.         . metaxalone (SKELAXIN) 800 MG tablet   Oral   Take 1 tablet (800 mg total) by mouth 3 (three) times daily.   15 tablet   0   . metoprolol succinate (TOPROL-XL) 50 MG 24 hr tablet   Oral   Take 50 mg by mouth daily.         . pantoprazole (PROTONIX) 40 MG tablet   Oral   Take 1 tablet (40 mg total) by mouth daily.   30 tablet   0   . predniSONE (DELTASONE) 10 MG tablet   Oral   Take 1 tablet (10 mg total) by mouth daily with breakfast.   4 tablet   0   . venlafaxine XR (EFFEXOR-XR) 150 MG 24 hr capsule    Oral   Take 1 capsule (150 mg total) by mouth daily with breakfast.   30 capsule   0     Allergies Review of patient's allergies indicates no known allergies.  Family History  Problem Relation Age of Onset  . CAD Other   . Diabetes Other     Social History Social History  Substance Use Topics  . Smoking status: Current Every Day Smoker -- 1.00 packs/day    Types: Cigarettes  . Smokeless tobacco: None  . Alcohol Use: No    Review of Systems Constitutional: No fever/chills Eyes: No visual changes. ENT: No sore throat. Cardiovascular: Denies chest pain. Respiratory: Denies shortness of breath. Gastrointestinal: No abdominal pain.  No nausea, no vomiting.  No diarrhea.  No constipation. Genitourinary: Negative for dysuria. Musculoskeletal: Negative for back pain. Skin: Negative for rash. Neurological: Negative for headaches, focal weakness or numbness.  10-point ROS otherwise negative.  ____________________________________________   PHYSICAL EXAM:  VITAL SIGNS: ED Triage Vitals  Enc Vitals Group     BP 07/17/15 1500 152/95 mmHg     Pulse Rate 07/17/15 1500 93     Resp 07/17/15 1500 20     Temp 07/17/15 1500 97.2 F (36.2 C)     Temp Source 07/17/15 1500 Oral     SpO2 07/17/15 1500 93 %     Weight 07/17/15 1500 205 lb (92.987 kg)     Height 07/17/15 1500 5\' 7"  (1.702 m)     Head Cir --      Peak Flow --      Pain Score 07/17/15 1501 10     Pain Loc --      Pain Edu? --      Excl. in GC? --     Constitutional: Alert and oriented. Disheveled Eyes: Conjunctivae are normal. PERRL. EOMI. Head: Atraumatic. Nose: No congestion/rhinnorhea. Mouth/Throat: Mucous membranes are moist.  Neck: No stridor.   Cardiovascular: Normal rate, regular rhythm. Grossly normal heart sounds.   Respiratory: Normal respiratory effort.  No retractions. Lungs CTAB. Gastrointestinal: Soft and nontender. No distention.  No CVA tenderness. Musculoskeletal: No lower extremity  tenderness nor edema.  No joint effusions. Neurologic:  Normal speech and language. No gross focal neurologic deficits are appreciated.  Skin:  Skin is warm, dry and intact. No rash noted. Psychiatric: Mood and affect are normal.   ____________________________________________   LABS (all labs ordered are listed, but only abnormal results are displayed)  Labs Reviewed  COMPREHENSIVE METABOLIC PANEL - Abnormal; Notable for the following:    ALT 16 (*)    All other components within normal limits  ACETAMINOPHEN LEVEL - Abnormal; Notable for the following:    Acetaminophen (Tylenol), Serum <10 (*)    All other components within normal limits  CBC - Abnormal; Notable for the following:    RDW 15.1 (*)    All other components within normal limits  ETHANOL  SALICYLATE LEVEL  URINE DRUG SCREEN, QUALITATIVE ([facility] ONLY)   ____________________________________________  EKG   ____________________________________________  RADIOLOGY   ____________________________________________   PROCEDURES   ____________________________________________   INITIAL IMPRESSION / ASSESSMENT AND PLAN / ED COURSE  Pertinent labs & imaging results that were available during my care of the patient were reviewed by me and considered in my medical decision making (see chart for details).  Patient made aware of involuntary commitment and the need to see psychiatry. ____________________________________________   FINAL CLINICAL IMPRESSION(S) / ED DIAGNOSES  Suicidal ideation.    NEW MEDICATIONS STARTED DURING THIS VISIT:  New Prescriptions   No medications on file     Note:  This document was prepared using Dragon voice recognition software and may include unintentional dictation errors.    Myrna Blazeravid Matthew Schaevitz, MD 07/17/15 [phone]

## 2015-07-17 NOTE — ED Notes (Signed)
Pt to ed via ems from rest home in [place] with c/o wanting to hurt himself.  Pt states if he could get a gun he would "blow my brains out".  Reports mild HI towards someone that lives with you.

## 2015-07-17 NOTE — ED Notes (Signed)
Pt placed on 2 Lt Oxygen. Oxygen level was 89 when checked. RN notified.

## 2015-07-17 NOTE — ED Notes (Signed)
Pt given urine cup and stated he hasn't been longed used the bathroom. Pt understands we need urine when he can.

## 2015-07-17 NOTE — BH Assessment (Signed)
Assessment Note  [name] is an 50 y.o. male. [name] arrived to the ED by way of EMS. He reports that he has been without his bipolar medication since February.  He reports having multiple health issues. He reports symptoms of depression and anxiety.  He reports that when he is at home he sees the devil in the wall and hears bells.  He denied having hallucinations while at the hospital.  He denied having suicidal or homicidal ideation or intent.  He states that that he went to the psychiatrist today and she sent him to the hospital.  He currently receives community support teams from [employer].  IVC documentation reports "the patient reports S.I., wanting to "blow my brains  out", Does not have any firearm. Says off meds since March".  Diagnosis: Bipolar Disorder  Past Medical History:  Past Medical History  Diagnosis Date  . Polysubstance abuse   . Bipolar disorder (HCC)   . Esophageal stricture   . H/O tonic-clonic seizures   . Anxiety   . GERD (gastroesophageal reflux disease)   . Chronic pain   . Asthma   . Hypertension     Past Surgical History  Procedure Laterality Date  . Appendectomy      complicated by perforation and abscess  . Peg w/tracheostomy placement    . Peg tube removal    . Tracheostomy closure    . Esophagogastroduodenoscopy (egd) with esophageal dilation    . Hernia repair  Nov 2016    ventral hernia repair at [facility] by Dr. Genene Churn    Family History:  Family History  Problem Relation Age of Onset  . CAD Other   . Diabetes Other     Social History:  reports that he has been smoking Cigarettes.  He has been smoking about 1.00 pack per day. He does not have any smokeless tobacco history on file. He reports that he uses illicit drugs (Marijuana). He reports that he does not drink alcohol.  Additional Social History:  Alcohol / Drug Use History of alcohol / drug use?: No history of alcohol / drug abuse (Denied use of alcohol or drugs,  UDS positive for MDMA)  CIWA: CIWA-Ar BP: (!) 150/89 mmHg Pulse Rate: 88 COWS:    Allergies: No Known Allergies  Home Medications:  (Not in a hospital admission)  OB/GYN Status:  No LMP for male patient.  General Assessment Data Location of Assessment: [facility] ED TTS Assessment: In system Is this a Tele or Face-to-Face Assessment?: Face-to-Face Is this an Initial Assessment or a Re-assessment for this encounter?: Initial Assessment Marital status: Single Maiden name: n/a Is patient pregnant?: No Pregnancy Status: No Living Arrangements: Group Home ([facility]) Can pt return to current living arrangement?: Yes Admission Status: Voluntary Is patient capable of signing voluntary admission?: Yes Referral Source: Self/Family/Friend Insurance type: Medicaid  Medical Screening Exam [facility] Walk-in ONLY) Medical Exam completed: Yes  Crisis Care Plan Living Arrangements: Group Home ([facility]) Legal Guardian: Other: (Self) Name of Psychiatrist: [facility] (Today was the first visit) Name of Therapist: [facility]  Education Status Is patient currently in school?: No Current Grade: n/a Highest grade of school patient has completed: 10th Name of school: [employer] person: n/a  Risk to self with the past 6 months Suicidal Ideation: No Has patient been a risk to self within the past 6 months prior to admission? : No Suicidal Intent: No Has patient had any suicidal intent  within the past 6 months prior to admission? : No Is patient at risk for suicide?: No Suicidal Plan?: No Has patient had any suicidal plan within the past 6 months prior to admission? : No Access to Means: No What has been your use of drugs/alcohol within the last 12 months?: denied use, Positive UDS for MDMA Previous Attempts/Gestures: No How many times?: 0 Other Self Harm Risks: denied Triggers for Past Attempts: None known Intentional Self Injurious  Behavior: None Family Suicide History: No Recent stressful life event(s):  (Current residence) Persecutory voices/beliefs?: No Depression: Yes Substance abuse history and/or treatment for substance abuse?: No Suicide prevention information given to non-admitted patients: Not applicable  Risk to Others within the past 6 months Homicidal Ideation: No Does patient have any lifetime risk of violence toward others beyond the six months prior to admission? : No Thoughts of Harm to Others: No Current Homicidal Intent: No Current Homicidal Plan: No Access to Homicidal Means: No Identified Victim: none identified History of harm to others?: No Assessment of Violence: None Noted Violent Behavior Description: denied Does patient have access to weapons?: No Criminal Charges Pending?: No Does patient have a court date: No Is patient on probation?: No  Psychosis Hallucinations:  (None current) Delusions: None noted  Mental Status Report Appearance/Hygiene: In scrubs Eye Contact: Good Motor Activity: Unremarkable Speech: Unremarkable Level of Consciousness: Alert Mood: Pleasant Affect: Appropriate to circumstance Anxiety Level: None Thought Processes: Coherent Judgement: Unimpaired Orientation: Person, Place, Time, Situation Obsessive Compulsive Thoughts/Behaviors: None  Cognitive Functioning Concentration: Normal Memory: Recent Intact IQ: Average Insight: Fair Impulse Control: Fair Appetite: Good Sleep: No Change Vegetative Symptoms: None  ADLScreening [facility] Assessment Services) Patient's cognitive ability adequate to safely complete daily activities?: Yes Patient able to express need for assistance with ADLs?: Yes Independently performs ADLs?: Yes (appropriate for developmental age)  Prior Inpatient Therapy Prior Inpatient Therapy: Yes Prior Therapy Dates: 2016 [facility] Reason for Treatment: Bipolar  Prior Outpatient Therapy Prior  Outpatient Therapy: Yes Prior Therapy Dates: Current Prior Therapy [facility]ilty[facility] Reason for Treatment: Bipolar Disorder Does patient have an ACCT team?: No Does patient have Intensive In-House Services?  : No Does patient have [facility] services? : No Does patient have P4CC services?: No  ADL Screening (condition at time of admission) Patient's cognitive ability adequate to safely complete daily activities?: Yes Patient able to express need for assistance with ADLs?: Yes Independently performs ADLs?: Yes (appropriate for developmental age)       Abuse/Neglect Assessment (Assessment to be complete while patient is alone) Physical Abuse: Denies Verbal Abuse: Denies Sexual Abuse: Denies Exploitation of patient/patient's resources: Denies Self-Neglect: Denies Values / Beliefs Cultural Requests During Hospitalization: None        Additional Information 1:1 In Past 12 Months?: No CIRT Risk: No Elopement Risk: No Does patient have medical clearance?: No     Disposition:  Disposition Initial Assessment Completed for this Encounter: Yes Disposition of Patient: Outpatient treatment  On Site Evaluation by:   Reviewed with Physician:    Justice DeedsKeisha Salisha Bardsley 07/17/2015 8:35 PM

## 2015-07-17 NOTE — [facility] Counselor (Signed)
[facility] Assessment Progress Note  1759 Writer called Delorise ShinerGrace, RN, to facilitate tele-assessment. She advised that Dr. Estrellita Ludwiglypacs just entered pt's room to complete his consult. Pt will be assessed on next shift.   Johny ShockSamantha M. Ladona Ridgelaylor, MS, NCC, LPCA Counselor

## 2015-07-17 NOTE — Consult Note (Signed)
[facility] Face-to-Face Psychiatry Consult   Reason for Consult:  Consult for this 50 year old man with a history of mental health problems and came to the emergency room saying that he was having suicidal thoughts. Referring Physician:  Clearnce Hasten Patient Identification: [name] MRN:  [id number] Principal Diagnosis: Bipolar disorder [facility]) Diagnosis:   Patient Active Problem List   Diagnosis Date Noted  . Bipolar disorder ([place]) [F31.9] 07/17/2015  . Pneumonia [J18.9] 02/05/2015  . Chest pain [R07.9] 01/31/2015  . Left sided chest pain [R07.9] 01/19/2015  . Adjustment disorder with depressed mood [F43.21] 01/15/2015  . Dyslipidemia [E78.5] 01/03/2015  . Tobacco use disorder [F17.200] 01/03/2015  . Hypertension [I10] 01/02/2015  . Gastric reflux [K21.9] 01/02/2015    Total Time spent with patient: 1 hour  Subjective:   [name] is a 50 y.o. male patient admitted with "I've not had my meds in a while and I want to kill myself".  HPI:  Patient interviewed. Reviewed his old chart including medical and psychiatric notes. Reviewed lab studies. Case discussed with nursing and emergency room physician. 50 year old man with a history of mental health problems. He came to the emergency room today saying that he had been off his medicines for a while and that he was thinking of killing himself. He tells me that he is having intrusive suicidal thoughts. He tells me that he's having thoughts about shooting himself although he admits that he has no gun and probably has no access to one. He says his mood is been bad knees been feeling like this for a few days. He attributes this to having been off his medicine which she says has been the case for the last couple months. He claims that his group home is refusing to get him his medication as prescribed. He says he is currently being given no psychiatric medicine at all when he is supposed to be on Klonopin and Seroquel as prescribed by his last  psychiatric hospital. He says his mood feels irritable all the time. He has thoughts about killing himself. He denies any homicidal ideation or wish to harm anybody else. He says that he sees visions of the devil. His description of this is a little unconvincing. Also talks about how he has a tingling feeling in his body. Patient denies that he's been drinking or using any drugs. He will only admit to a past history of marijuana use. Patient says that he is currently getting his psychiatric treatment through [facility]. He can't really reconcile why he is not being given medicine right now.  Medical history: Patient has multiple medical problems including hypertension history of gastric reflux history of dyslipidemia past history of episodes of pneumonia. He tells me that he is currently supposed to be using oxygen every night to sleep.  [facility] history: [facility] Says that he doesn't really have any family nobody that he stays in touch with. Social life is pretty limited.  Substance abuse history: Patient does have a past history of substance abuse with multiple substances abused. He seems to be minimizing all of that right now.  Past Psychiatric History: Past psychiatric history with a variety of diagnoses including major depression, polysubstance abuse, bipolar disorder. He admits that he is never actually tried to kill himself in the past. The medicines that I see that he's most commonly been prescribed were antidepressants. It does look like he's been prescribed modest doses of Klonopin in the past. I don't see a clearly documented in  any recent notes that he had had psychotic symptoms. Patient does not have a history of violence to others.  Risk to Self: Is patient at risk for suicide?: Yes Risk to Others:   Prior Inpatient Therapy:   Prior Outpatient Therapy:    Past Medical History:  Past Medical History  Diagnosis Date  . Polysubstance abuse   . Bipolar disorder ([place])   .  Esophageal stricture   . H/O tonic-clonic seizures   . Anxiety   . GERD (gastroesophageal reflux disease)   . Chronic pain   . Asthma   . Hypertension     Past Surgical History  Procedure Laterality Date  . Appendectomy      complicated by perforation and abscess  . Peg w/tracheostomy placement    . Peg tube removal    . Tracheostomy closure    . Esophagogastroduodenoscopy (egd) with esophageal dilation    . Hernia repair  Nov 2016    ventral hernia repair at [facility] by Dr. Tod Persia   Family History:  Family History  Problem Relation Age of Onset  . CAD Other   . Diabetes Other    Family Psychiatric  History: Patient says that his mother had bipolar schizophrenia. Social History:  History  Alcohol Use No     History  Drug Use  . Yes  . Special: Marijuana    Comment: states he does not use marijuana now    Social History   Social History  . Marital Status: Single    Spouse Name: N/A  . Number of Children: N/A  . Years of Education: N/A   Social History Main Topics  . Smoking status: Current Every Day Smoker -- 1.00 packs/day    Types: Cigarettes  . Smokeless tobacco: None  . Alcohol Use: No  . Drug Use: Yes    Special: Marijuana     Comment: states he does not use marijuana now  . Sexual Activity: Not Asked   Other Topics Concern  . None   Social History Narrative   Additional Social History:    Allergies:  No Known Allergies  Labs:  Results for orders placed or performed during the hospital encounter of 07/17/15 (from the past 48 hour(s))  Comprehensive metabolic panel     Status: Abnormal   Collection Time: 07/17/15  3:06 PM  Result Value Ref Range   Sodium 141 135 - 145 mmol/L   Potassium 4.4 3.5 - 5.1 mmol/L   Chloride 101 101 - 111 mmol/L   CO2 31 22 - 32 mmol/L   Glucose, Bld 98 65 - 99 mg/dL   BUN 14 6 - 20 mg/dL   Creatinine, Ser 1.17 0.61 - 1.24 mg/dL   Calcium 9.4 8.9 - 10.3 mg/dL   Total Protein 7.8 6.5 - 8.1 g/dL   Albumin 4.3 3.5  - 5.0 g/dL   AST 19 15 - 41 U/L   ALT 16 (L) 17 - 63 U/L   Alkaline Phosphatase 56 38 - 126 U/L   Total Bilirubin 0.5 0.3 - 1.2 mg/dL   GFR calc non Af Amer >60 >60 mL/min   GFR calc Af Amer >60 >60 mL/min    Comment: (NOTE) The eGFR has been calculated using the CKD EPI equation. This calculation has not been validated in all clinical situations. eGFR's persistently <60 mL/min signify possible Chronic Kidney Disease.    Anion gap 9 5 - 15  Ethanol     Status: None   Collection Time: 07/17/15  3:06 PM  Result Value Ref Range   Alcohol, Ethyl (B) <5 <5 mg/dL    Comment:        LOWEST DETECTABLE LIMIT FOR SERUM ALCOHOL IS 5 mg/dL FOR MEDICAL PURPOSES ONLY   Salicylate level     Status: None   Collection Time: 07/17/15  3:06 PM  Result Value Ref Range   Salicylate Lvl <2.1 2.8 - 30.0 mg/dL  Acetaminophen level     Status: Abnormal   Collection Time: 07/17/15  3:06 PM  Result Value Ref Range   Acetaminophen (Tylenol), Serum <10 (L) 10 - 30 ug/mL    Comment:        THERAPEUTIC CONCENTRATIONS VARY SIGNIFICANTLY. A RANGE OF 10-30 ug/mL MAY BE AN EFFECTIVE CONCENTRATION FOR MANY PATIENTS. HOWEVER, SOME ARE BEST TREATED AT CONCENTRATIONS OUTSIDE THIS RANGE. ACETAMINOPHEN CONCENTRATIONS >150 ug/mL AT 4 HOURS AFTER INGESTION AND >50 ug/mL AT 12 HOURS AFTER INGESTION ARE OFTEN ASSOCIATED WITH TOXIC REACTIONS.   cbc     Status: Abnormal   Collection Time: 07/17/15  3:06 PM  Result Value Ref Range   WBC 7.8 3.8 - 10.6 K/uL   RBC 4.88 4.40 - 5.90 MIL/uL   Hemoglobin 14.8 13.0 - 18.0 g/dL   HCT 44.4 40.0 - 52.0 %   MCV 91.0 80.0 - 100.0 fL   MCH 30.3 26.0 - 34.0 pg   MCHC 33.2 32.0 - 36.0 g/dL   RDW 15.1 (H) 11.5 - 14.5 %   Platelets 186 150 - 440 K/uL  Urine Drug Screen, Qualitative     Status: Abnormal   Collection Time: 07/17/15  3:57 PM  Result Value Ref Range   Tricyclic, Ur Screen NONE DETECTED NONE DETECTED   Amphetamines, Ur Screen NONE DETECTED NONE DETECTED    MDMA (Ecstasy)Ur Screen POSITIVE (A) NONE DETECTED   Cocaine Metabolite,Ur [place] NONE DETECTED NONE DETECTED   Opiate, Ur Screen NONE DETECTED NONE DETECTED   Phencyclidine (PCP) Ur S NONE DETECTED NONE DETECTED   Cannabinoid 50 Ng, Ur [place] NONE DETECTED NONE DETECTED   Barbiturates, Ur Screen NONE DETECTED NONE DETECTED   Benzodiazepine, Ur Scrn NONE DETECTED NONE DETECTED   Methadone Scn, Ur NONE DETECTED NONE DETECTED    Comment: (NOTE) 224  Tricyclics, urine               Cutoff 1000 ng/mL 200  Amphetamines, urine             Cutoff 1000 ng/mL 300  MDMA (Ecstasy), urine           Cutoff 500 ng/mL 400  Cocaine Metabolite, urine       Cutoff 300 ng/mL 500  Opiate, urine                   Cutoff 300 ng/mL 600  Phencyclidine (PCP), urine      Cutoff 25 ng/mL 700  Cannabinoid, urine              Cutoff 50 ng/mL 800  Barbiturates, urine             Cutoff 200 ng/mL 900  Benzodiazepine, urine           Cutoff 200 ng/mL 1000 Methadone, urine                Cutoff 300 ng/mL 1100 1200 The urine drug screen provides only a preliminary, unconfirmed 1300 analytical test result and should not be used for non-medical 1400 purposes. Clinical consideration and professional judgment should  1500 be applied to any positive drug screen result due to possible 1600 interfering substances. A more specific alternate chemical method 1700 must be used in order to obtain a confirmed analytical result.  1800 Gas chromato graphy / mass spectrometry (GC/MS) is the preferred 1900 confirmatory method.     Current Facility-Administered Medications  Medication Dose Route Frequency Provider Last Rate Last Dose  . albuterol (PROVENTIL HFA;VENTOLIN HFA) 108 (90 Base) MCG/ACT inhaler 2 puff  2 puff Inhalation Q4H PRN Gonzella Lex, MD      . Derrill Memo ON 07/18/2015] atorvastatin (LIPITOR) tablet 10 mg  10 mg Oral q1800 Gonzella Lex, MD      . clonazePAM Bobbye [place]) tablet 0.5 mg  0.5 mg Oral TID Gonzella Lex, MD      .  diltiazem (CARDIZEM) tablet 30 mg  30 mg Oral Q8H Gonzella Lex, MD      . FLUoxetine (PROZAC) capsule 20 mg  20 mg Oral Daily Gonzella Lex, MD      . loratadine (CLARITIN) tablet 10 mg  10 mg Oral Daily Gonzella Lex, MD      . metoprolol succinate (TOPROL-XL) 24 hr tablet 50 mg  50 mg Oral Daily John T Clapacs, MD      . QUEtiapine (SEROQUEL) tablet 100 mg  100 mg Oral QHS Gonzella Lex, MD       Current Outpatient Prescriptions  Medication Sig Dispense Refill  . albuterol (PROVENTIL HFA;VENTOLIN HFA) 108 (90 BASE) MCG/ACT inhaler Inhale 2 puffs into the lungs every 6 (six) hours as needed for wheezing or shortness of breath. 1 Inhaler 2  . atorvastatin (LIPITOR) 10 MG tablet Take 10 mg by mouth every evening.    . benzonatate (TESSALON) 200 MG capsule Take 1 capsule (200 mg total) by mouth 3 (three) times daily as needed for cough. 20 capsule 0  . busPIRone (BUSPAR) 15 MG tablet Take 1 tablet (15 mg total) by mouth 3 (three) times daily. 90 tablet 0  . clonazePAM (KLONOPIN) 0.5 MG tablet Take 1 tablet (0.5 mg total) by mouth 3 (three) times daily as needed. (Patient taking differently: Take 0.5 mg by mouth 3 (three) times daily as needed for anxiety. ) 30 tablet 0  . diltiazem (CARDIZEM) 30 MG tablet Take 1 tablet (30 mg total) by mouth 4 (four) times daily. 120 tablet 0  . FLUoxetine (PROZAC) 20 MG capsule Take 20 mg by mouth 2 (two) times daily.    . fluticasone (FLONASE) 50 MCG/ACT nasal spray Place 2 sprays into both nostrils daily.    Marland Kitchen gabapentin (NEURONTIN) 300 MG capsule Take 1 capsule (300 mg total) by mouth 2 (two) times daily. 60 capsule 0  . levofloxacin (LEVAQUIN) 500 MG tablet Take 1 tablet (500 mg total) by mouth daily. 3 tablet 0  . loratadine (CLARITIN) 10 MG tablet Take 10 mg by mouth daily.    . metaxalone (SKELAXIN) 800 MG tablet Take 1 tablet (800 mg total) by mouth 3 (three) times daily. 15 tablet 0  . metoprolol succinate (TOPROL-XL) 50 MG 24 hr tablet Take 50 mg by  mouth daily.    . pantoprazole (PROTONIX) 40 MG tablet Take 1 tablet (40 mg total) by mouth daily. 30 tablet 0  . predniSONE (DELTASONE) 10 MG tablet Take 1 tablet (10 mg total) by mouth daily with breakfast. 4 tablet 0  . venlafaxine XR (EFFEXOR-XR) 150 MG 24 hr capsule Take 1 capsule (150 mg total) by mouth daily with  breakfast. 30 capsule 0  . [DISCONTINUED] potassium chloride (KLOR-CON M10) 10 MEQ tablet Take 1 tablet by mouth daily.      Musculoskeletal: Strength & Muscle Tone: within normal limits Gait & Station: normal Patient leans: N/A  Psychiatric Specialty Exam: Review of Systems  Constitutional: Negative.   HENT: Negative.   Eyes: Negative.   Respiratory: Negative.   Cardiovascular: Negative.   Gastrointestinal: Negative.   Musculoskeletal: Negative.   Skin: Negative.   Neurological: Negative.   Psychiatric/Behavioral: Positive for depression, suicidal ideas and hallucinations. Negative for memory loss and substance abuse. The patient is nervous/anxious and has insomnia.     Blood pressure 152/95, pulse 93, temperature 97.2 F (36.2 C), temperature source Oral, resp. rate 20, height '5\' 7"'  (1.702 m), weight 92.987 kg (205 lb), SpO2 93 %.Body mass index is 32.1 kg/(m^2).  General Appearance: Disheveled  Eye Contact::  Fair  Speech:  Garbled  Volume:  Normal  Mood:  Anxious and Irritable  Affect:  Constricted  Thought Process:  Tangential  Orientation:  Full (Time, Place, and Person)  Thought Content:  Hallucinations: Visual  Suicidal Thoughts:  Yes.  with intent/plan  Homicidal Thoughts:  No  Memory:  Immediate;   Good Recent;   Good Remote;   Fair  Judgement:  Fair  Insight:  Fair  Psychomotor Activity:  Decreased  Concentration:  Fair  Recall:  [employer] of Knowledge:Fair  Language: Fair  Akathisia:  No  Handed:  Right  AIMS (if indicated):     Assets:  Communication Skills Desire for Improvement Housing Resilience  ADL's:  Intact  Cognition:  Impaired,  Mild  Sleep:      Treatment Plan Summary: Daily contact with patient to assess and evaluate symptoms and progress in treatment, Medication management and Plan 50 year old man with a past history mostly marked by mood anxiety and substance abuse symptoms now coming into the hospital claiming that he has not been on his medicine in months. If he is really living in a group home this seems a little hard to believe especially if he is still seeing his psychiatrist. Patient looks quite disheveled and seems pretty irritable. Some of the things in his history don't really make much sense in some of it doesn't match up very well with his past history. He does have a drug screen positive for MDMA but no other drugs and no positive alcohol. In any case he is talking about killing himself and is agitated. We cannot admit him to the psychiatry ward because he uses oxygen at night. Reviewed this with emergency room physician. I am going to start him back on his usual medicine with psychiatric medicines as Prozac 20 mg a day, Klonopin a half milligram 3 times a day and Seroquel 100 mg at night. We will reevaluate him in the morning and see how he is feeling at that point. It's possible that he may have calm down and be stable enough that we could discharge him home. Otherwise we will have to look into transfer to other facilities. Patient is aware of the plan.  Disposition: Supportive therapy provided about ongoing stressors. Discussed crisis plan, support from social network, calling 911, coming to the Emergency Department, and calling Suicide Hotline.  Alethia Berthold, MD 07/17/2015 6:39 PM

## 2015-07-17 NOTE — ED Notes (Signed)
Pt reports that he has a hx of bipolar, anxiety, and depression. States he is suicidal and would kill himself if could get his hands on a gun. Denies HI. Reports he's been out of his meds since march when he was D/C from the hospital

## 2015-07-17 NOTE — ED Notes (Signed)
Pt reports he has a headache and groin pain, asking for pain medication. EDP made aware and verbal orders received for ibuprofen

## 2015-07-17 NOTE — ED Notes (Signed)
Pt states that he is usually on 2L oxygen at home, currently 89% on RA. Pt placed in room on 2L per MD orders

## 2015-07-18 DIAGNOSIS — F310 Bipolar disorder, current episode hypomanic: Secondary | ICD-10-CM | POA: Insufficient documentation

## 2015-07-18 MED ORDER — QUETIAPINE FUMARATE 100 MG PO TABS: 100.0000 mg | ORAL_TABLET | Freq: Every day | ORAL | Status: AC

## 2015-07-18 MED ORDER — FLUOXETINE HCL 20 MG PO CAPS: 20.0000 mg | ORAL_CAPSULE | Freq: Every day | ORAL | Status: AC

## 2015-07-18 MED ORDER — CLONAZEPAM 0.5 MG PO TABS: 0.5000 mg | ORAL_TABLET | Freq: Three times a day (TID) | ORAL | Status: AC

## 2015-07-18 NOTE — ED Notes (Signed)
BEHAVIORAL HEALTH ROUNDING Patient sleeping: No. Patient alert and oriented: yes Behavior appropriate: Yes.  ; If no, describe:  Nutrition and fluids offered: yes Toileting and hygiene offered: Yes  Sitter present: q15 minute observations and security  monitoring Law enforcement present: Yes  ODS  

## 2015-07-18 NOTE — ED Notes (Signed)
Patient observed lying in bed with eyes closed  Even, unlabored respirations observed   NAD pt appears to be sleeping  I will continue to monitor along with every 15 minute visual observations and ongoing security monitoring    

## 2015-07-18 NOTE — ED Notes (Signed)
BEHAVIORAL HEALTH ROUNDING Patient sleeping: Yes.   Patient alert and oriented: eyes closed  Appears to be asleep Behavior appropriate: Yes.  ; If no, describe:  Nutrition and fluids offered: sleeping Toileting and hygiene offered: sleeping Sitter present: q 15 minute observations and security monitoring Law enforcement present: yes  ODS 

## 2015-07-18 NOTE — ED Provider Notes (Signed)
-----------------------------------------   7:22 AM on 07/18/2015 -----------------------------------------   Blood pressure 101/79, pulse 95, temperature 98.6 F (37 C), temperature source Oral, resp. rate 18, height 5\' 7"  (1.702 m), weight 205 lb (92.987 kg), SpO2 98 %.  The patient had no acute events since last update.  Calm and cooperative at this time.    Patient awaiting full psychiatric assessment at this time.     Rebecka ApleyAllison P Webster, MD 07/18/15 [phone]

## 2015-07-18 NOTE — ED Notes (Signed)
ED BHU PLACEMENT JUSTIFICATION Is the patient under IVC or is there intent for IVC: Yes.   Is the patient medically cleared: Yes.   Is there vacancy in the ED BHU: Yes.   Is the population mix appropriate for patient: Yes.   Is the patient awaiting placement in inpatient or outpatient setting:  Has the patient had a psychiatric consult:  Yes -   Survey of unit performed for contraband, proper placement and condition of furniture, tampering with fixtures in bathroom, shower, and each patient room: Yes.  ; Findings:  APPEARANCE/BEHAVIOR Calm and cooperative NEURO ASSESSMENT Orientation: oriented x4 hallucinations: No.None noted (Hallucinations) Speech: Normal Gait: normal RESPIRATORY ASSESSMENT Even  Unlabored respirations  CARDIOVASCULAR ASSESSMENT Pulses equal   regular rate  Skin warm and dry   GASTROINTESTINAL ASSESSMENT no GI complaint EXTREMITIES Full ROM  PLAN OF CARE Provide calm/safe environment. Vital signs assessed twice daily. ED BHU Assessment once each 12-hour shift. Collaborate with intake RN daily or as condition indicates. Assure the ED provider has rounded once each shift. Provide and encourage hygiene. Provide redirection as needed. Assess for escalating behavior; address immediately and inform ED provider.  Assess family dynamic and appropriateness for visitation as needed: Yes.  ; If necessary, describe findings:  Educate the patient/family about BHU procedures/visitation: Yes.  ; If necessary, describe findings:

## 2015-07-18 NOTE — Consult Note (Signed)
[facility] Face-to-Face Psychiatry Consult   Reason for Consult:  Consult for this 50 year old man with a history of mental health problems and came to the emergency room saying that he was having suicidal thoughts. Referring Physician:  Clearnce Hasten Patient Identification: [name] MRN:  [id number] Principal Diagnosis: Bipolar disorder [facility]) Diagnosis:   Patient Active Problem List   Diagnosis Date Noted  . Bipolar disorder ([place]) [F31.9] 07/17/2015  . Suicidal ideation [R45.851] 07/17/2015  . Pneumonia [J18.9] 02/05/2015  . Chest pain [R07.9] 01/31/2015  . Left sided chest pain [R07.9] 01/19/2015  . Adjustment disorder with depressed mood [F43.21] 01/15/2015  . Dyslipidemia [E78.5] 01/03/2015  . Tobacco use disorder [F17.200] 01/03/2015  . Hypertension [I10] 01/02/2015  . Gastric reflux [K21.9] 01/02/2015    Total Time spent with patient: 30 minutes  Subjective:   [name] is a 50 y.o. male patient admitted with "I've not had my meds in a while and I want to kill myself".  Follow-up note in the emergency room on May 3. Patient interviewed. Vital signs reviewed. This 50 year old man with a past history which was dominated by psychiatric problems of substance abuse and depression presented to the emergency room yesterday claiming he was having auditory hallucinations and suicidal thoughts. He was asking to be put back on his medicines specifically on Seroquel. He denied yesterday that he had been recently abusing drugs or alcohol. Because of his need for oxygen at night he could not be admitted to the psychiatry ward. Reevaluated today the patient says he is feeling better. He states that his mood is feeling better. He has not been hearing or seeing things today. He denies any suicidal ideation. Patient was asleep when I came in but readily woke up. Alert and oriented 4. No evidence of delusions. Vital signs are all stable.  HPI:  Patient interviewed. Reviewed his old chart including  medical and psychiatric notes. Reviewed lab studies. Case discussed with nursing and emergency room physician. 50 year old man with a history of mental health problems. He came to the emergency room today saying that he had been off his medicines for a while and that he was thinking of killing himself. He tells me that he is having intrusive suicidal thoughts. He tells me that he's having thoughts about shooting himself although he admits that he has no gun and probably has no access to one. He says his mood is been bad knees been feeling like this for a few days. He attributes this to having been off his medicine which she says has been the case for the last couple months. He claims that his group home is refusing to get him his medication as prescribed. He says he is currently being given no psychiatric medicine at all when he is supposed to be on Klonopin and Seroquel as prescribed by his last psychiatric hospital. He says his mood feels irritable all the time. He has thoughts about killing himself. He denies any homicidal ideation or wish to harm anybody else. He says that he sees visions of the devil. His description of this is a little unconvincing. Also talks about how he has a tingling feeling in his body. Patient denies that he's been drinking or using any drugs. He will only admit to a past history of marijuana use. Patient says that he is currently getting his psychiatric treatment through [facility]. He can't really reconcile why he is not being given medicine right now.  Medical history: Patient has multiple medical problems including hypertension  history of gastric reflux history of dyslipidemia past history of episodes of pneumonia. He tells me that he is currently supposed to be using oxygen every night to sleep.  [facility] history[facility] Says that he doesn't really have any family nobody that he stays in touch with. Social life is pretty limited.  Substance abuse history:  Patient does have a past history of substance abuse with multiple substances abused. He seems to be minimizing all of that right now.  Past Psychiatric History: Past psychiatric history with a variety of diagnoses including major depression, polysubstance abuse, bipolar disorder. He admits that he is never actually tried to kill himself in the past. The medicines that I see that he's most commonly been prescribed were antidepressants. It does look like he's been prescribed modest doses of Klonopin in the past. I don't see a clearly documented in any recent notes that he had had psychotic symptoms. Patient does not have a history of violence to others.  Risk to Self: Suicidal Ideation: No Suicidal Intent: No Is patient at risk for suicide?: No Suicidal Plan?: No Access to Means: No What has been your use of drugs/alcohol within the last 12 months?: denied use, Positive UDS for MDMA How many times?: 0 Other Self Harm Risks: denied Triggers for Past Attempts: None known Intentional Self Injurious Behavior: None Risk to Others: Homicidal Ideation: No Thoughts of Harm to Others: No Current Homicidal Intent: No Current Homicidal Plan: No Access to Homicidal Means: No Identified Victim: none identified History of harm to others?: No Assessment of Violence: None Noted Violent Behavior Description: denied Does patient have access to weapons?: No Criminal Charges Pending?: No Does patient have a court date: No Prior Inpatient Therapy: Prior Inpatient Therapy: Yes Prior Therapy Dates: 2016 Prior Therapy Facilty/Provider(s): [facility] Reason for Treatment: Bipolar Prior Outpatient Therapy: Prior Outpatient Therapy: Yes Prior Therapy Dates: Current Prior Therapy Facilty/Provider(s): [facility] Reason for Treatment: Bipolar Disorder Does patient have an ACCT team?: No Does patient have Intensive In-House Services?  : No Does patient have [facility] services? : No Does patient have P4CC  services?: No  Past Medical History:  Past Medical History  Diagnosis Date  . Polysubstance abuse   . Bipolar disorder ([place])   . Esophageal stricture   . H/O tonic-clonic seizures   . Anxiety   . GERD (gastroesophageal reflux disease)   . Chronic pain   . Asthma   . Hypertension     Past Surgical History  Procedure Laterality Date  . Appendectomy      complicated by perforation and abscess  . Peg w/tracheostomy placement    . Peg tube removal    . Tracheostomy closure    . Esophagogastroduodenoscopy (egd) with esophageal dilation    . Hernia repair  Nov 2016    ventral hernia repair at [facility] by Dr. Tod Persia   Family History:  Family History  Problem Relation Age of Onset  . CAD Other   . Diabetes Other    Family Psychiatric  History: Patient says that his mother had bipolar schizophrenia. Social History:  History  Alcohol Use No     History  Drug Use  . Yes  . Special: Marijuana    Comment: states he does not use marijuana now    Social History   Social History  . Marital Status: Single    Spouse Name: N/A  . Number of Children: N/A  . Years of Education: N/A   Social History  Main Topics  . Smoking status: Current Every Day Smoker -- 1.00 packs/day    Types: Cigarettes  . Smokeless tobacco: None  . Alcohol Use: No  . Drug Use: Yes    Special: Marijuana     Comment: states he does not use marijuana now  . Sexual Activity: Not Asked   Other Topics Concern  . None   Social History Narrative   Additional Social History:    Allergies:  No Known Allergies  Labs:  Results for orders placed or performed during the hospital encounter of 07/17/15 (from the past 48 hour(s))  Comprehensive metabolic panel     Status: Abnormal   Collection Time: 07/17/15  3:06 PM  Result Value Ref Range   Sodium 141 135 - 145 mmol/L   Potassium 4.4 3.5 - 5.1 mmol/L   Chloride 101 101 - 111 mmol/L   CO2 31 22 - 32 mmol/L   Glucose, Bld 98 65 - 99 mg/dL   BUN 14 6 -  20 mg/dL   Creatinine, Ser 1.17 0.61 - 1.24 mg/dL   Calcium 9.4 8.9 - 10.3 mg/dL   Total Protein 7.8 6.5 - 8.1 g/dL   Albumin 4.3 3.5 - 5.0 g/dL   AST 19 15 - 41 U/L   ALT 16 (L) 17 - 63 U/L   Alkaline Phosphatase 56 38 - 126 U/L   Total Bilirubin 0.5 0.3 - 1.2 mg/dL   GFR calc non Af Amer >60 >60 mL/min   GFR calc Af Amer >60 >60 mL/min    Comment: (NOTE) The eGFR has been calculated using the CKD EPI equation. This calculation has not been validated in all clinical situations. eGFR's persistently <60 mL/min signify possible Chronic Kidney Disease.    Anion gap 9 5 - 15  Ethanol     Status: None   Collection Time: 07/17/15  3:06 PM  Result Value Ref Range   Alcohol, Ethyl (B) <5 <5 mg/dL    Comment:        LOWEST DETECTABLE LIMIT FOR SERUM ALCOHOL IS 5 mg/dL FOR MEDICAL PURPOSES ONLY   Salicylate level     Status: None   Collection Time: 07/17/15  3:06 PM  Result Value Ref Range   Salicylate Lvl <0.3 2.8 - 30.0 mg/dL  Acetaminophen level     Status: Abnormal   Collection Time: 07/17/15  3:06 PM  Result Value Ref Range   Acetaminophen (Tylenol), Serum <10 (L) 10 - 30 ug/mL    Comment:        THERAPEUTIC CONCENTRATIONS VARY SIGNIFICANTLY. A RANGE OF 10-30 ug/mL MAY BE AN EFFECTIVE CONCENTRATION FOR MANY PATIENTS. HOWEVER, SOME ARE BEST TREATED AT CONCENTRATIONS OUTSIDE THIS RANGE. ACETAMINOPHEN CONCENTRATIONS >150 ug/mL AT 4 HOURS AFTER INGESTION AND >50 ug/mL AT 12 HOURS AFTER INGESTION ARE OFTEN ASSOCIATED WITH TOXIC REACTIONS.   cbc     Status: Abnormal   Collection Time: 07/17/15  3:06 PM  Result Value Ref Range   WBC 7.8 3.8 - 10.6 K/uL   RBC 4.88 4.40 - 5.90 MIL/uL   Hemoglobin 14.8 13.0 - 18.0 g/dL   HCT 44.4 40.0 - 52.0 %   MCV 91.0 80.0 - 100.0 fL   MCH 30.3 26.0 - 34.0 pg   MCHC 33.2 32.0 - 36.0 g/dL   RDW 15.1 (H) 11.5 - 14.5 %   Platelets 186 150 - 440 K/uL  Urine Drug Screen, Qualitative     Status: Abnormal   Collection Time: 07/17/15  3:57  PM  Result Value Ref Range   Tricyclic, Ur Screen NONE DETECTED NONE DETECTED   Amphetamines, Ur Screen NONE DETECTED NONE DETECTED   MDMA (Ecstasy)Ur Screen POSITIVE (A) NONE DETECTED   Cocaine Metabolite,Ur [place] NONE DETECTED NONE DETECTED   Opiate, Ur Screen NONE DETECTED NONE DETECTED   Phencyclidine (PCP) Ur S NONE DETECTED NONE DETECTED   Cannabinoid 50 Ng, Ur [place] NONE DETECTED NONE DETECTED   Barbiturates, Ur Screen NONE DETECTED NONE DETECTED   Benzodiazepine, Ur Scrn NONE DETECTED NONE DETECTED   Methadone Scn, Ur NONE DETECTED NONE DETECTED    Comment: (NOTE) 884  Tricyclics, urine               Cutoff 1000 ng/mL 200  Amphetamines, urine             Cutoff 1000 ng/mL 300  MDMA (Ecstasy), urine           Cutoff 500 ng/mL 400  Cocaine Metabolite, urine       Cutoff 300 ng/mL 500  Opiate, urine                   Cutoff 300 ng/mL 600  Phencyclidine (PCP), urine      Cutoff 25 ng/mL 700  Cannabinoid, urine              Cutoff 50 ng/mL 800  Barbiturates, urine             Cutoff 200 ng/mL 900  Benzodiazepine, urine           Cutoff 200 ng/mL 1000 Methadone, urine                Cutoff 300 ng/mL 1100 1200 The urine drug screen provides only a preliminary, unconfirmed 1300 analytical test result and should not be used for non-medical 1400 purposes. Clinical consideration and professional judgment should 1500 be applied to any positive drug screen result due to possible 1600 interfering substances. A more specific alternate chemical method 1700 must be used in order to obtain a confirmed analytical result.  1800 Gas chromato graphy / mass spectrometry (GC/MS) is the preferred 1900 confirmatory method.     Current Facility-Administered Medications  Medication Dose Route Frequency Provider Last Rate Last Dose  . albuterol (PROVENTIL HFA;VENTOLIN HFA) 108 (90 Base) MCG/ACT inhaler 2 puff  2 puff Inhalation Q4H PRN Gonzella Lex, MD      . atorvastatin (LIPITOR) tablet 10 mg  10 mg  Oral q1800 Gonzella Lex, MD      . clonazePAM [place]bye [place]) tablet 0.5 mg  0.5 mg Oral TID Gonzella Lex, MD   0.5 mg at 07/18/15 1311  . diltiazem (CARDIZEM) tablet 30 mg  30 mg Oral Q8H Gonzella Lex, MD   30 mg at 07/18/15 1310  . FLUoxetine (PROZAC) capsule 20 mg  20 mg Oral Daily Gonzella Lex, MD   20 mg at 07/18/15 1312  . loratadine (CLARITIN) tablet 10 mg  10 mg Oral Daily Gonzella Lex, MD   10 mg at 07/18/15 1310  . metoprolol succinate (TOPROL-XL) 24 hr tablet 50 mg  50 mg Oral Daily Gonzella Lex, MD   50 mg at 07/18/15 1311  . QUEtiapine (SEROQUEL) tablet 100 mg  100 mg Oral QHS Gonzella Lex, MD   100 mg at 07/18/15 0000   Current Outpatient Prescriptions  Medication Sig Dispense Refill  . albuterol (PROVENTIL HFA;VENTOLIN HFA) 108 (90 BASE) MCG/ACT inhaler Inhale 2 puffs into the lungs  every 6 (six) hours as needed for wheezing or shortness of breath. 1 Inhaler 2  . atorvastatin (LIPITOR) 10 MG tablet Take 10 mg by mouth every evening.    . benzonatate (TESSALON) 200 MG capsule Take 1 capsule (200 mg total) by mouth 3 (three) times daily as needed for cough. 20 capsule 0  . busPIRone (BUSPAR) 15 MG tablet Take 1 tablet (15 mg total) by mouth 3 (three) times daily. 90 tablet 0  . clonazePAM (KLONOPIN) 0.5 MG tablet Take 1 tablet (0.5 mg total) by mouth 3 (three) times daily as needed. (Patient taking differently: Take 0.5 mg by mouth 3 (three) times daily as needed for anxiety. ) 30 tablet 0  . clonazePAM (KLONOPIN) 0.5 MG tablet Take 1 tablet (0.5 mg total) by mouth 3 (three) times daily. 30 tablet 0  . diltiazem (CARDIZEM) 30 MG tablet Take 1 tablet (30 mg total) by mouth 4 (four) times daily. 120 tablet 0  . FLUoxetine (PROZAC) 20 MG capsule Take 20 mg by mouth 2 (two) times daily.    Marland Kitchen FLUoxetine (PROZAC) 20 MG capsule Take 1 capsule (20 mg total) by mouth daily. 30 capsule 0  . fluticasone (FLONASE) 50 MCG/ACT nasal spray Place 2 sprays into both nostrils daily.    Marland Kitchen  gabapentin (NEURONTIN) 300 MG capsule Take 1 capsule (300 mg total) by mouth 2 (two) times daily. 60 capsule 0  . levofloxacin (LEVAQUIN) 500 MG tablet Take 1 tablet (500 mg total) by mouth daily. 3 tablet 0  . loratadine (CLARITIN) 10 MG tablet Take 10 mg by mouth daily.    . metaxalone (SKELAXIN) 800 MG tablet Take 1 tablet (800 mg total) by mouth 3 (three) times daily. 15 tablet 0  . metoprolol succinate (TOPROL-XL) 50 MG 24 hr tablet Take 50 mg by mouth daily.    . pantoprazole (PROTONIX) 40 MG tablet Take 1 tablet (40 mg total) by mouth daily. 30 tablet 0  . predniSONE (DELTASONE) 10 MG tablet Take 1 tablet (10 mg total) by mouth daily with breakfast. 4 tablet 0  . QUEtiapine (SEROQUEL) 100 MG tablet Take 1 tablet (100 mg total) by mouth at bedtime. 30 tablet 0  . venlafaxine XR (EFFEXOR-XR) 150 MG 24 hr capsule Take 1 capsule (150 mg total) by mouth daily with breakfast. 30 capsule 0  . [DISCONTINUED] potassium chloride (KLOR-CON M10) 10 MEQ tablet Take 1 tablet by mouth daily.      Musculoskeletal: Strength & Muscle Tone: within normal limits Gait & Station: normal Patient leans: N/A  Psychiatric Specialty Exam: Review of Systems  Constitutional: Negative.   HENT: Negative.   Eyes: Negative.   Respiratory: Negative.   Cardiovascular: Negative.   Gastrointestinal: Negative.   Musculoskeletal: Negative.   Skin: Negative.   Neurological: Negative.   Psychiatric/Behavioral: Negative for depression, suicidal ideas, hallucinations, memory loss and substance abuse. The patient is not nervous/anxious and does not have insomnia.     Blood pressure 101/79, pulse 95, temperature 98.6 F (37 C), temperature source Oral, resp. rate 18, height '5\' 7"'  (1.702 m), weight 92.987 kg (205 lb), SpO2 98 %.Body mass index is 32.1 kg/(m^2).  General Appearance: Disheveled  Eye Contact::  Fair  Speech:  Garbled  Volume:  Normal  Mood:  Anxious and Irritable  Affect:  Constricted  Thought Process:   Tangential  Orientation:  Full (Time, Place, and Person)  Thought Content:  Negative  Suicidal Thoughts:  No  Homicidal Thoughts:  No  Memory:  Immediate;  Good Recent;   Good Remote;   Fair  Judgement:  Fair  Insight:  Fair  Psychomotor Activity:  Decreased  Concentration:  Fair  Recall:  [employer] of Knowledge:Fair  Language: Fair  Akathisia:  No  Handed:  Right  AIMS (if indicated):     Assets:  Communication Skills Desire for Improvement Housing Resilience  ADL's:  Intact  Cognition: Impaired,  Mild  Sleep:      Treatment Plan Summary: Daily contact with patient to assess and evaluate symptoms and progress in treatment, Medication management and Plan As I was anticipating he seems much better today. He was started back on Prozac and Seroquel and Klonopin which she had been on in the past. Vital signs are currently stable. He is much calmer and more lucid. Although he still does not admitted I have a feeling that part of this could've been substance related. In any case at this point he no longer meets commitment criteria and does not require inpatient treatment. I suggested to him that we have him go back to his group home and continue to follow up with [facility]. I was agreeable to giving him a prescription for his medicines. I will give only 30 pills of the clonazepam the others get a 30 day supply. Patient will continue to follow up with [facility]. IVC discontinued. Case reviewed with TTS and ER doctor.  Disposition: Supportive therapy provided about ongoing stressors. Discussed crisis plan, support from social network, calling 911, coming to the Emergency Department, and calling Suicide Hotline.  Alethia Berthold, MD 07/18/2015 2:02 PM

## 2015-07-18 NOTE — Discharge Instructions (Signed)

## 2015-07-18 NOTE — ED Provider Notes (Signed)
Filed Vitals:   07/18/15 0000 07/18/15 0700  BP: 135/88 101/79  Pulse:  95  Temp:  98.6 F (37 C)  Resp:  18     Patient seen and cleared for discharge back to his group home with follow-up with his previous psychiatry team at [facility]. Dr. Toni Amendlapacs advises discharge, and is rescinded the patient's involuntary commitment.  Sharyn CreamerMark Quale, MD 07/18/15 1328

## 2015-07-18 NOTE — ED Notes (Signed)

## 2015-07-18 NOTE — ED Notes (Signed)
Pt given lunch tray.

## 2015-08-09 ENCOUNTER — Other Ambulatory Visit: Payer: Self-pay | Admitting: Psychiatry

## 2015-08-14 ENCOUNTER — Other Ambulatory Visit: Payer: Self-pay | Admitting: Psychiatry

## 2015-11-10 ENCOUNTER — Emergency Department: Payer: Medicaid Other

## 2015-11-10 ENCOUNTER — Emergency Department
Admission: EM | Admit: 2015-11-10 | Discharge: 2015-11-10 | Disposition: A | Discharge status: Home / Self Care | Payer: Medicaid Other | Attending: Emergency Medicine | Admitting: Emergency Medicine

## 2015-11-10 DIAGNOSIS — F17210 Nicotine dependence, cigarettes, uncomplicated: Secondary | ICD-10-CM | POA: Diagnosis not present

## 2015-11-10 DIAGNOSIS — R0789 Other chest pain: Secondary | ICD-10-CM | POA: Insufficient documentation

## 2015-11-10 DIAGNOSIS — I10 Essential (primary) hypertension: Secondary | ICD-10-CM | POA: Diagnosis not present

## 2015-11-10 DIAGNOSIS — J45909 Unspecified asthma, uncomplicated: Secondary | ICD-10-CM | POA: Diagnosis not present

## 2015-11-10 DIAGNOSIS — Z79899 Other long term (current) drug therapy: Secondary | ICD-10-CM | POA: Diagnosis not present

## 2015-11-10 DIAGNOSIS — B349 Viral infection, unspecified: Secondary | ICD-10-CM | POA: Insufficient documentation

## 2015-11-10 LAB — CBC
HEMATOCRIT: 44.7 % (ref 40.0–52.0)
Hemoglobin: 15.3 g/dL (ref 13.0–18.0)
MCH: 29.7 pg (ref 26.0–34.0)
MCHC: 34.2 g/dL (ref 32.0–36.0)
MCV: 87 fL (ref 80.0–100.0)
PLATELETS: 211 10*3/uL (ref 150–440)
RBC: 5.13 MIL/uL (ref 4.40–5.90)
RDW: 14.9 % — ABNORMAL HIGH (ref 11.5–14.5)
WBC: 8.2 10*3/uL (ref 3.8–10.6)

## 2015-11-10 LAB — COMPREHENSIVE METABOLIC PANEL
ALT: 22 U/L (ref 17–63)
AST: 22 U/L (ref 15–41)
Albumin: 3.7 g/dL (ref 3.5–5.0)
Alkaline Phosphatase: 67 U/L (ref 38–126)
Anion gap: 7 (ref 5–15)
BUN: 15 mg/dL (ref 6–20)
CHLORIDE: 96 mmol/L — AB (ref 101–111)
CO2: 35 mmol/L — ABNORMAL HIGH (ref 22–32)
Calcium: 9.4 mg/dL (ref 8.9–10.3)
Creatinine, Ser: 1.09 mg/dL (ref 0.61–1.24)
Glucose, Bld: 137 mg/dL — ABNORMAL HIGH (ref 65–99)
POTASSIUM: 3.2 mmol/L — AB (ref 3.5–5.1)
Sodium: 138 mmol/L (ref 135–145)
Total Bilirubin: 0.1 mg/dL — ABNORMAL LOW (ref 0.3–1.2)
Total Protein: 7.9 g/dL (ref 6.5–8.1)

## 2015-11-10 LAB — TROPONIN I: Troponin I: <0.03 ng/mL (ref <0.03)

## 2015-11-10 MED ORDER — AZITHROMYCIN 250 MG PO TABS: ORAL_TABLET | ORAL | 0 refills | Status: DC

## 2015-11-10 NOTE — ED Provider Notes (Signed)
[facility] Emergency Department Provider Note  ____________________________________________   First MD Initiated Contact with Patient 11/10/15 [phone]     (approximate)  I have reviewed the triage vital signs and the nursing notes.   HISTORY  Chief Complaint Rectal Bleeding and Chest Pain    HPI [name] is a 50 y.o. male with extensive past medical history including chronic chest pain, daily tobacco use, gastric reflux, and bipolar disorder as well as polysubstance abuse who presents for evaluation of multiple complaints.  His initial complaint was for rectal bleeding.  He states that he has had 4 bowel movements today each time he wipes there is a small amount of bright red blood on the paper.  There is no blood mixed in with the stool.  The symptoms are mild, but he thought he should get it checked out.  During the course of our review of systems, he also complains of chest pain that started earlier in the day (many hours ago) and has completely resolved, shortness of breath similar to baseline (he has COPD and is on oxygen at home), left ear pain, various aches and pains throughout his body, and general malaise.  Overall he describes all of his symptoms together as serious and severebut nothing in particular or taken individually is bad.  Nothing makes them better or worse although wiping seems to make the blood in the stool worse.  Fever/chills, vomiting, dysuria.   Past Medical History:  Diagnosis Date  . Anxiety   . Asthma   . Bipolar disorder (HCC)   . Chronic pain   . Esophageal stricture   . GERD (gastroesophageal reflux disease)   . H/O tonic-clonic seizures   . Hypertension   . Polysubstance abuse     Patient Active Problem List   Diagnosis Date Noted  . Bipolar affective disorder, current episode hypomanic (HCC)   . Bipolar disorder (HCC) 07/17/2015  . Suicidal ideation 07/17/2015  . Pneumonia 02/05/2015  . Chest pain 01/31/2015    . Left sided chest pain 01/19/2015  . Adjustment disorder with depressed mood 01/15/2015  . Dyslipidemia 01/03/2015  . Tobacco use disorder 01/03/2015  . Hypertension 01/02/2015  . Gastric reflux 01/02/2015    Past Surgical History:  Procedure Laterality Date  . APPENDECTOMY     complicated by perforation and abscess  . ESOPHAGOGASTRODUODENOSCOPY (EGD) WITH ESOPHAGEAL DILATION    . HERNIA REPAIR  Nov 2016   ventral hernia repair at [facility] by Dr. Genene Churn  . PEG TUBE REMOVAL    . PEG W/TRACHEOSTOMY PLACEMENT    . TRACHEOSTOMY CLOSURE      Prior to Admission medications   Medication Sig Start Date End Date Taking? Authorizing Provider  albuterol (PROVENTIL HFA;VENTOLIN HFA) 108 (90 BASE) MCG/ACT inhaler Inhale 2 puffs into the lungs every 6 (six) hours as needed for wheezing or shortness of breath. 02/09/15   Katha Hamming, MD  atorvastatin (LIPITOR) 10 MG tablet Take 10 mg by mouth every evening.    Historical Provider, MD  azithromycin (ZITHROMAX) 250 MG tablet Take 2 tablets PO on day 1, then take 1 tablet PO daily for 4 more days 11/10/15   Loleta Rose, MD  benzonatate (TESSALON) 200 MG capsule Take 1 capsule (200 mg total) by mouth 3 (three) times daily as needed for cough. 01/20/15   Gale Journey, MD  busPIRone (BUSPAR) 15 MG tablet Take 1 tablet (15 mg total) by mouth 3 (three) times daily. 12/29/14   Loleta Rose,  MD  clonazePAM (KLONOPIN) 0.5 MG tablet Take 1 tablet (0.5 mg total) by mouth 3 (three) times daily. 07/18/15   Audery Amel, MD  diltiazem (CARDIZEM) 30 MG tablet Take 1 tablet (30 mg total) by mouth 4 (four) times daily. 12/29/14   Loleta Rose, MD  FLUoxetine (PROZAC) 20 MG capsule Take 1 capsule (20 mg total) by mouth daily. 07/18/15   Audery Amel, MD  fluticasone (FLONASE) 50 MCG/ACT nasal spray Place 2 sprays into both nostrils daily.    Historical Provider, MD  gabapentin (NEURONTIN) 300 MG capsule Take 1 capsule (300 mg total) by mouth 2 (two) times  daily. 12/29/14   Loleta Rose, MD  levofloxacin (LEVAQUIN) 500 MG tablet Take 1 tablet (500 mg total) by mouth daily. 02/09/15   Katha Hamming, MD  loratadine (CLARITIN) 10 MG tablet Take 10 mg by mouth daily.    Historical Provider, MD  metaxalone (SKELAXIN) 800 MG tablet Take 1 tablet (800 mg total) by mouth 3 (three) times daily. 01/19/15   Katha Hamming, MD  metoprolol succinate (TOPROL-XL) 50 MG 24 hr tablet Take 50 mg by mouth daily.    Historical Provider, MD  pantoprazole (PROTONIX) 40 MG tablet Take 1 tablet (40 mg total) by mouth daily. 12/29/14   Loleta Rose, MD  predniSONE (DELTASONE) 10 MG tablet Take 1 tablet (10 mg total) by mouth daily with breakfast. 02/10/15   Katha Hamming, MD  QUEtiapine (SEROQUEL) 100 MG tablet Take 1 tablet (100 mg total) by mouth at bedtime. 07/18/15   Audery Amel, MD  venlafaxine XR (EFFEXOR-XR) 150 MG 24 hr capsule Take 1 capsule (150 mg total) by mouth daily with breakfast. 01/05/15   Shari Prows, MD    Allergies Review of patient's allergies indicates no known allergies.  Family History  Problem Relation Age of Onset  . CAD Other   . Diabetes Other     Social History Social History  Substance Use Topics  . Smoking status: Current Every Day Smoker    Packs/day: 1.00    Types: Cigarettes  . Smokeless tobacco: Not on file  . Alcohol use No    Review of Systems Constitutional: No fever/chills Eyes: No visual changes. ENT: No sore throat.  Pain in right ear after cleaning it with Q-tip Cardiovascular: +chest pain. Respiratory: +shortness of breath. Gastrointestinal: No abdominal pain.  No nausea, no vomiting.  No diarrhea.  No constipation. BRBPR (when wiping) Genitourinary: Negative for dysuria. Musculoskeletal: Negative for back pain. Skin: Negative for rash. Neurological: Negative for headaches, focal weakness or numbness.  10-point ROS otherwise  negative.  ____________________________________________   PHYSICAL EXAM:  VITAL SIGNS: ED Triage Vitals  Enc Vitals Group     BP 11/10/15 0022 (!) 138/92     Pulse Rate 11/10/15 0022 (!) 103     Resp 11/10/15 0022 (!) 22     Temp 11/10/15 0022 98.3 F (36.8 C)     Temp Source 11/10/15 0022 Oral     SpO2 11/10/15 0022 90 %     Weight 11/10/15 0023 210 lb (95.3 kg)     Height 11/10/15 0023 5\' 7"  (1.702 m)     Head Circumference --      Peak Flow --      Pain Score 11/10/15 0023 10     Pain Loc --      Pain Edu? --      Excl. in GC? --     Constitutional: Alert and oriented. Well appearing  and in no acute distress. Eyes: Conjunctivae are normal. PERRL. EOMI. Head: Atraumatic. Ears:  Ceruminous bilaterally, but no evidence of active infection Nose: No congestion/rhinnorhea. Mouth/Throat: Mucous membranes are moist.  Oropharynx non-erythematous. Neck: No stridor.  No meningeal signs.   Cardiovascular: Initially borderline tachycardia, resolved by the time of my examinatino, regular rhythm. Good peripheral circulation. Grossly normal heart sounds. Respiratory: Normal respiratory effort.  No retractions. Lungs CTAB. Gastrointestinal: Soft and nontender. No distention.  Rectal:  Normal external exam.  Soft light brown stool in rectal vault.  No gross blood.  Hemoccult negative with quality control passed.  Nurse chaperone present for exam. Musculoskeletal: No lower extremity tenderness nor edema. No gross deformities of extremities. Neurologic:  Normal speech and language. No gross focal neurologic deficits are appreciated.  Skin:  Skin is warm, dry and intact. No rash noted. Psychiatric: Mood and affect are normal. Speech and behavior are normal.  ____________________________________________   LABS (all labs ordered are listed, but only abnormal results are displayed)  Labs Reviewed  COMPREHENSIVE METABOLIC PANEL - Abnormal; Notable for the following:       Result Value    Potassium 3.2 (*)    Chloride 96 (*)    CO2 35 (*)    Glucose, Bld 137 (*)    Total Bilirubin 0.1 (*)    All other components within normal limits  CBC - Abnormal; Notable for the following:    RDW 14.9 (*)    All other components within normal limits  TROPONIN I   ____________________________________________  EKG  ED ECG REPORT I, Paislie Tessler, the attending physician, personally viewed and interpreted this ECG.  Date: 11/10/2015 EKG Time: 00:20 Rate: 104 Rhythm: sinus tachycardia QRS Axis: normal Intervals: normal ST/T Wave abnormalities: normal Conduction Disturbances: none Narrative Interpretation: unremarkable  ____________________________________________  RADIOLOGY   Dg Chest 2 View  Result Date: 11/10/2015 CLINICAL DATA:  Upper respiratory tract symptoms, shortness of breath. History of polysubstance abuse, smoker. EXAM: CHEST  2 VIEW COMPARISON:  Chest radiograph January 30, 2015 FINDINGS: Cardiomediastinal silhouette is normal. Diffuse interstitial prominence more conspicuous than prior examination without pleural effusion or focal consolidation increased lung volumes. No pneumothorax. Soft tissue planes and included osseous structures are nonsuspicious. IMPRESSION: COPD, and probable superimposed viral pneumonia . Electronically Signed   By: Awilda Metro M.D.   On: 11/10/2015 00:46    ____________________________________________   PROCEDURES  Procedure(s) performed:   Procedures   Critical Care performed: No ____________________________________________   INITIAL IMPRESSION / ASSESSMENT AND PLAN / ED COURSE  Pertinent labs & imaging results that were available during my care of the patient were reviewed by me and considered in my medical decision making (see chart for details).  Patient is well-appearing and in no acute distress.  He has numerous minor complaints but not one specific thing that concern him more than the others other than the  rectal bleeding, and his exam is completely negative.  He has chronic chest pain and the onset of pain today was many hours ago so only one troponin is series and it is negative.  Given the possibility of mild "viral pneumonia" on his chest x-ray and his ongoing tobacco abuse I will err on the side of caution and give him a prescription for azithromycin.  He has no evidence of acute or emergent medical condition and he can follow up as an outpatient.  I explained all this to the patient and he understands and agrees with the plan.   ____________________________________________  FINAL CLINICAL IMPRESSION(S) / ED DIAGNOSES  Final diagnoses:  Viral syndrome  Atypical chest pain     MEDICATIONS GIVEN DURING THIS VISIT:  Medications - No data to display   NEW OUTPATIENT MEDICATIONS STARTED DURING THIS VISIT:  New Prescriptions   AZITHROMYCIN (ZITHROMAX) 250 MG TABLET    Take 2 tablets PO on day 1, then take 1 tablet PO daily for 4 more days      Note:  This document was prepared using Dragon voice recognition software and may include unintentional dictation errors.    Loleta Roseory Johnie Stadel, MD 11/10/15 [phone]

## 2015-11-10 NOTE — ED Notes (Signed)
Pt states only use  Oxygen as needed. Pt calling a cab to come get him per his request. MD aware.

## 2015-11-10 NOTE — ED Notes (Addendum)
MD at bedside. 

## 2015-11-10 NOTE — ED Notes (Signed)
Discharge instructions reviewed with patient. Questions fielded by this RN. Patient verbalizes understanding of instructions. Patient discharged home in stable condition per [place] MD . No acute distress noted at time of discharge.   

## 2015-11-10 NOTE — ED Triage Notes (Signed)
EMS pt to triage via wheelchair. Pt reports when he used the bathroom tonight he noticed streaked blood on the tissue. Pt also reports he has had a cold and is having pain to his left chest since this evening. Pt reports + shortness of breath, +diaphoresis and +nausea with the pain. Pt talking in full and complete sentences with no difficulty at this time.

## 2015-11-10 NOTE — Discharge Instructions (Signed)
You had no evidence of rectal bleeding tonight, and the rest of your workup was reassuring.  Please follow up with Dr. Sallee LangeSoles as scheduled on Tuesday.  Return to the Emergency Department with new or worsening symptoms that concern you.

## 2015-11-19 ENCOUNTER — Emergency Department: Payer: Medicaid Other

## 2015-11-19 ENCOUNTER — Emergency Department
Admission: EM | Admit: 2015-11-19 | Discharge: 2015-11-19 | Disposition: A | Discharge status: Home / Self Care | Payer: Medicaid Other | Attending: Emergency Medicine | Admitting: Emergency Medicine

## 2015-11-19 ENCOUNTER — Encounter: Payer: Self-pay | Admitting: Emergency Medicine

## 2015-11-19 DIAGNOSIS — R0602 Shortness of breath: Secondary | ICD-10-CM | POA: Diagnosis present

## 2015-11-19 DIAGNOSIS — I10 Essential (primary) hypertension: Secondary | ICD-10-CM | POA: Insufficient documentation

## 2015-11-19 DIAGNOSIS — R1084 Generalized abdominal pain: Secondary | ICD-10-CM | POA: Diagnosis not present

## 2015-11-19 DIAGNOSIS — J441 Chronic obstructive pulmonary disease with (acute) exacerbation: Secondary | ICD-10-CM

## 2015-11-19 DIAGNOSIS — F17210 Nicotine dependence, cigarettes, uncomplicated: Secondary | ICD-10-CM | POA: Diagnosis not present

## 2015-11-19 DIAGNOSIS — R197 Diarrhea, unspecified: Secondary | ICD-10-CM | POA: Diagnosis not present

## 2015-11-19 DIAGNOSIS — J45909 Unspecified asthma, uncomplicated: Secondary | ICD-10-CM | POA: Insufficient documentation

## 2015-11-19 LAB — BASIC METABOLIC PANEL
Anion gap: 5 (ref 5–15)
BUN: 13 mg/dL (ref 6–20)
CALCIUM: 8.7 mg/dL — AB (ref 8.9–10.3)
CO2: 29 mmol/L (ref 22–32)
Chloride: 105 mmol/L (ref 101–111)
Creatinine, Ser: 0.82 mg/dL (ref 0.61–1.24)
GFR calc Af Amer: >60 mL/min (ref >60)
GLUCOSE: 132 mg/dL — AB (ref 65–99)
Potassium: 3.9 mmol/L (ref 3.5–5.1)
Sodium: 139 mmol/L (ref 135–145)

## 2015-11-19 LAB — HEPATIC FUNCTION PANEL
ALK PHOS: 59 U/L (ref 38–126)
ALT: 31 U/L (ref 17–63)
AST: 28 U/L (ref 15–41)
Albumin: 3.7 g/dL (ref 3.5–5.0)
BILIRUBIN TOTAL: 0.3 mg/dL (ref 0.3–1.2)
Total Protein: 7.3 g/dL (ref 6.5–8.1)

## 2015-11-19 LAB — BRAIN NATRIURETIC PEPTIDE: B Natriuretic Peptide: 68 pg/mL (ref 0.0–100.0)

## 2015-11-19 LAB — CBC
HCT: 42.8 % (ref 40.0–52.0)
Hemoglobin: 14.3 g/dL (ref 13.0–18.0)
MCH: 29.5 pg (ref 26.0–34.0)
MCHC: 33.3 g/dL (ref 32.0–36.0)
MCV: 88.6 fL (ref 80.0–100.0)
PLATELETS: 195 10*3/uL (ref 150–440)
RBC: 4.83 MIL/uL (ref 4.40–5.90)
RDW: 15.3 % — ABNORMAL HIGH (ref 11.5–14.5)
WBC: 7.1 10*3/uL (ref 3.8–10.6)

## 2015-11-19 LAB — FIBRIN DERIVATIVES D-DIMER (ARMC ONLY): FIBRIN DERIVATIVES D-DIMER (ARMC): 471 (ref 0–499)

## 2015-11-19 LAB — TROPONIN I: Troponin I: <0.03 ng/mL (ref <0.03)

## 2015-11-19 LAB — LACTIC ACID, PLASMA: LACTIC ACID, VENOUS: 1.3 mmol/L (ref 0.5–1.9)

## 2015-11-19 LAB — LIPASE, BLOOD: LIPASE: 23 U/L (ref 11–51)

## 2015-11-19 MED ORDER — SODIUM CHLORIDE 0.9 % IV BOLUS (SEPSIS)
500.0000 mL | Freq: Once | INTRAVENOUS | Status: AC
  Administered 2015-11-19: 500 mL via INTRAVENOUS

## 2015-11-19 MED ORDER — IPRATROPIUM-ALBUTEROL 0.5-2.5 (3) MG/3ML IN SOLN
3.0000 mL | Freq: Once | RESPIRATORY_TRACT | Status: AC
  Administered 2015-11-19: 3 mL via RESPIRATORY_TRACT

## 2015-11-19 MED ORDER — IOPAMIDOL (ISOVUE-300) INJECTION 61%
30.0000 mL | Freq: Once | INTRAVENOUS | Status: AC
  Administered 2015-11-19: 30 mL via ORAL

## 2015-11-19 MED ORDER — METHYLPREDNISOLONE SODIUM SUCC 125 MG IJ SOLR
INTRAMUSCULAR | Status: AC
  Administered 2015-11-19: 125 mg via INTRAVENOUS
  Filled 2015-11-19: qty 2

## 2015-11-19 MED ORDER — METHYLPREDNISOLONE SODIUM SUCC 125 MG IJ SOLR
125.0000 mg | Freq: Once | INTRAMUSCULAR | Status: AC
  Administered 2015-11-19: 125 mg via INTRAVENOUS

## 2015-11-19 MED ORDER — IPRATROPIUM-ALBUTEROL 0.5-2.5 (3) MG/3ML IN SOLN
RESPIRATORY_TRACT | Status: AC
Start: 2015-11-19 — End: 2015-11-19
  Administered 2015-11-19: 3 mL via RESPIRATORY_TRACT
  Filled 2015-11-19: qty 9

## 2015-11-19 MED ORDER — IPRATROPIUM-ALBUTEROL 0.5-2.5 (3) MG/3ML IN SOLN
3.0000 mL | Freq: Once | RESPIRATORY_TRACT | Status: AC
  Administered 2015-11-19: 3 mL via RESPIRATORY_TRACT
  Filled 2015-11-19: qty 3

## 2015-11-19 MED ORDER — MORPHINE SULFATE (PF) 4 MG/ML IV SOLN
4.0000 mg | Freq: Once | INTRAVENOUS | Status: AC
Start: 2015-11-19 — End: 2015-11-19
  Administered 2015-11-19: 4 mg via INTRAVENOUS
  Filled 2015-11-19: qty 1

## 2015-11-19 MED ORDER — IOPAMIDOL (ISOVUE-300) INJECTION 61%
100.0000 mL | Freq: Once | INTRAVENOUS | Status: AC | PRN
  Administered 2015-11-19: 100 mL via INTRAVENOUS

## 2015-11-19 MED ORDER — ONDANSETRON HCL 4 MG/2ML IJ SOLN
4.0000 mg | Freq: Once | INTRAMUSCULAR | Status: AC
  Administered 2015-11-19: 4 mg via INTRAVENOUS
  Filled 2015-11-19: qty 2

## 2015-11-19 MED ORDER — PREDNISONE 20 MG PO TABS: 60.0000 mg | ORAL_TABLET | Freq: Every day | ORAL | 0 refills | Status: AC

## 2015-11-19 NOTE — Discharge Instructions (Signed)
You were seen today in the Emergency Department (ED) and was diagnosed with a COPD exacerbation. Chronic obstructive pulmonary disease (COPD) is a general term for a group of lung diseases, including emphysema and chronic bronchitis. People with COPD have decreased airflow in and out of the lungs, which makes it hard to breathe. The airways also can get clogged with thick mucus. Cigarette smoking is a major cause of COPD.  ° °Although there is no cure for COPD, you can slow its progress. Following your treatment plan and taking care of yourself can help you feel better and live longer.  ° °Use your albuterol inhaler 2 puffs every 4 hour as needed for shortness of breath, wheezing, or cough. Take steroids as prescribed.  ° °Follow-up with your doctor in 1 day for re-evaluation.  ° °When should you call for help?  °Call 911 anytime you think you may need emergency care. For example, call if:  °You have severe trouble breathing.  °You have severe chest pain. °Call your doctor now or seek immediate medical care if:  °You have new or worse shortness of breath.  °You develop new chest pain.  °You are coughing more deeply or more often, especially if you notice more mucus or a change in the color of your mucus.  °You cough up blood.  °You have new or increased swelling in your legs or belly.  °You have a fever. °Watch closely for changes in your health, and be sure to contact your doctor if:  °You use your antibiotic prescription.  °Your symptoms are getting worse ° ° °How can you care for yourself at home?  ° °During an exacerbation  °Do not panic if you start to have one. Quick treatment at home may help you prevent serious breathing problems. If you have a COPD exacerbation plan that you developed with your doctor, follow it.  °Take your medicines exactly as your doctor tells you.  °Use your inhaler as directed by your doctor. If your symptoms do not get better after you use your medicine, have someone take you to the  emergency room. Call an ambulance if necessary.  °With inhaled medicines, a spacer or a nebulizer may help you get more medicine to your lungs. Ask your doctor or pharmacist how to use them properly. Practice using the spacer in front of a mirror before you have an exacerbation. This may help you get the medicine into your lungs quickly.  °If your doctor has given you steroid pills, take them as directed.  °Your doctor may have given you a prescription for antibiotics, which you are to fill if you need it. Call your doctor if you use the prescription.  °Talk to your doctor if you have any problems with your medicine. ° °Preventing an exacerbation  °Do not smoke. This is the most important step you can take to prevent more damage to your lungs and prevent problems. If you already smoke, it is never too late to stop. If you need help quitting, talk to your doctor about stop-smoking programs and medicines. These can increase your chances of quitting for good.  °Take your daily medicines as prescribed.  °Avoid colds and flu.  °Get a pneumococcal vaccine.  °Get a flu vaccine each year, as soon as it is available. Ask those you live or work with to do the same, so they will not get the flu and infect you.  °Try to stay away from people with colds or the flu.  °Wash your   hands often. °Avoid secondhand smoke; air pollution; cold, dry air; hot, humid air; and high altitudes. Stay at home with your windows closed when air pollution is bad.  °Learn breathing techniques for COPD, such as breathing through pursed lips. These techniques can help you breathe easier during an exacerbation. ° °Staying healthy  °Do not smoke. This is the most important step you can take to prevent more damage to your lungs. If you need help quitting, talk to your doctor about stop-smoking programs and medicines. These can increase your chances of quitting for good.  °Avoid colds and flu. Get a pneumococcal vaccine shot. If you have had one before,  ask your doctor whether you need a second dose. Get the flu vaccine every fall. If you must be around people with colds or the flu, wash your hands often.  °Avoid secondhand smoke, air pollution, and high altitudes. Also avoid cold, dry air and hot, humid air. Stay at home with your windows closed when air pollution is bad. ° °Medicines and oxygen therapy  °Take your medicines exactly as prescribed. Call your doctor if you think you are having a problem with your medicine.  °You may be taking medicines such as:  °Bronchodilators. These help open your airways and make breathing easier. Bronchodilators are either short-acting (work for 6 to 9 hours) or long-acting (work for 24 hours). You inhale most bronchodilators, so they start to act quickly. Always carry your quick-relief inhaler with you in case you need it while you are away from home.  °Corticosteroids (prednisone, budesonide). These reduce airway inflammation. They come in pill or inhaled form. You must take these medicines every day for them to work well. °A spacer may help you get more inhaled medicine to your lungs. Ask your doctor or pharmacist if a spacer is right for you. If it is, ask how to use it properly.  °Do not take any vitamins, over-the-counter medicine, or herbal products without talking to your doctor first.  °If your doctor prescribed antibiotics, take them as directed. Do not stop taking them just because you feel better. You need to take the full course of antibiotics.  °Oxygen therapy boosts the amount of oxygen in your blood and helps you breathe easier. Use the flow rate your doctor has recommended, and do not change it without talking to your doctor first. ° °Activity  °Get regular exercise. Walking is an easy way to get exercise. Start out slowly, and walk a little more each day.  °Pay attention to your breathing. You are exercising too hard if you cannot talk while you are exercising.  °Take short rest breaks when doing household  chores and other activities.  °Learn breathing methods--such as breathing through pursed lips--to help you become less short of breath.  °If your doctor has not set you up with a pulmonary rehabilitation program, talk to him or her about whether rehab is right for you. Rehab includes exercise programs, education about your disease and how to manage it, help with diet and other changes, and emotional support. ° °Diet  °Eat regular, healthy meals. Use bronchodilators about 1 hour before you eat to make it easier to eat. Eat several small meals instead of three large ones. Drink beverages at the end of the meal. Avoid foods that are hard to chew.  °Eat foods that contain fat and protein so that you do not lose weight and muscle mass. These foods include ice cream, pudding, cheese, eggs, and peanut butter.  °Use less   salt. Too much salt can cause you to retain fluids, which makes it harder to breathe. Do not add salt while you are cooking or at the table. Eat fewer processed foods and foods from restaurants, including fast foods. Use fresh or frozen foods instead of canned foods. ° °Mental health  °Talk to your family, friends, or a therapist about your feelings. It is normal to feel frightened, angry, hopeless, helpless, and even guilty. Talking openly about bad feelings can help you cope. If these feelings last, talk to your doctor. ° ° ° °

## 2015-11-19 NOTE — ED Notes (Signed)
Patient transported to CT 

## 2015-11-19 NOTE — ED Notes (Signed)
CT tech notified pt finished drinking contrast, CT tech verbalized understanding.

## 2015-11-19 NOTE — ED Triage Notes (Signed)
Per [facility], patient c/o CP. Hx of a fib, HTN, and COPD. ST on monitor for EMS, rate of 110. Here patient is [id number]. Patient also c/o bright red blood in stool x 3days. Patient wears chronic O2 at home, 2L. Patient is A&O x4.

## 2015-11-19 NOTE — ED Provider Notes (Signed)
[facility] Emergency Department Provider Note  ____________________________________________  Time seen: Approximately 5:55 PM  I have reviewed the triage vital signs and the nursing notes.   HISTORY  Chief Complaint Chest Pain   HPI [name] is a 50 y.o. male history of bipolar disorder, COPD, chronic chest pain, esophageal stricture, GERD, seizure, hypertension, polysubstance abuse and presents for evaluation of chest pain/ abdominal pain. Patient reports for the last 3 days he has had black stools and also bright red blood in the paper when he wipes himself. He's been having watery stools once every hour for the last 3 days. He denies nausea or vomiting, denies abdominal pain or fever. Denies any NSAID use, denies blood thinners, denies prior history of GI bleed, recent abx use. He also reports progressively worsening shortness of breath and a dry cough for the last few days with intermittent wheezing. Hasn't been using his inhalers. Today he reports that he was making himself established 3 hours prior to arrival when he developed sudden onset of left sided lower chest/upper abdominal pain. He reports that the pain was severe, sudden, associated with dizziness and shortness of breath. He put his oxygen on and had no relief of the symptoms that prompted him to call EMS. Patient denies ever having similar pain. He denies using drugs. He reports family history of ischemic heart disease in his father. Patient is a current smoker. Patient has had prior abdominal surgeries including appendectomy and hernia repair. Patient were no personal history of PE however has a history of DVT and his mother, no recent travel or immobilization, no leg pain or swelling, no hemoptysis.  Past Medical History:  Diagnosis Date  . Anxiety   . Asthma   . Bipolar disorder (HCC)   . Chronic pain   . Esophageal stricture   . GERD (gastroesophageal reflux disease)   . H/O tonic-clonic  seizures   . Hypertension   . Polysubstance abuse     Patient Active Problem List   Diagnosis Date Noted  . Bipolar affective disorder, current episode hypomanic (HCC)   . Bipolar disorder (HCC) 07/17/2015  . Suicidal ideation 07/17/2015  . Pneumonia 02/05/2015  . Chest pain 01/31/2015  . Left sided chest pain 01/19/2015  . Adjustment disorder with depressed mood 01/15/2015  . Dyslipidemia 01/03/2015  . Tobacco use disorder 01/03/2015  . Hypertension 01/02/2015  . Gastric reflux 01/02/2015    Past Surgical History:  Procedure Laterality Date  . APPENDECTOMY     complicated by perforation and abscess  . ESOPHAGOGASTRODUODENOSCOPY (EGD) WITH ESOPHAGEAL DILATION    . HERNIA REPAIR  Nov 2016   ventral hernia repair at [facility] by Dr. Genene Churn  . PEG TUBE REMOVAL    . PEG W/TRACHEOSTOMY PLACEMENT    . TRACHEOSTOMY CLOSURE      Prior to Admission medications   Medication Sig Start Date End Date Taking? Authorizing Provider  albuterol (PROVENTIL HFA;VENTOLIN HFA) 108 (90 BASE) MCG/ACT inhaler Inhale 2 puffs into the lungs every 6 (six) hours as needed for wheezing or shortness of breath. 02/09/15   Katha Hamming, MD  atorvastatin (LIPITOR) 10 MG tablet Take 10 mg by mouth every evening.    Historical Provider, MD  azithromycin (ZITHROMAX) 250 MG tablet Take 2 tablets PO on day 1, then take 1 tablet PO daily for 4 more days 11/10/15   Loleta Rose, MD  benzonatate (TESSALON) 200 MG capsule Take 1 capsule (200 mg total) by mouth 3 (three) times daily  as needed for cough. 01/20/15   Gale Journeyatherine P Walsh, MD  busPIRone (BUSPAR) 15 MG tablet Take 1 tablet (15 mg total) by mouth 3 (three) times daily. 12/29/14   Loleta Roseory Forbach, MD  clonazePAM (KLONOPIN) 0.5 MG tablet Take 1 tablet (0.5 mg total) by mouth 3 (three) times daily. 07/18/15   Audery AmelJohn T Clapacs, MD  diltiazem (CARDIZEM) 30 MG tablet Take 1 tablet (30 mg total) by mouth 4 (four) times daily. 12/29/14   Loleta Roseory Forbach, MD  FLUoxetine  (PROZAC) 20 MG capsule Take 1 capsule (20 mg total) by mouth daily. 07/18/15   Audery AmelJohn T Clapacs, MD  fluticasone (FLONASE) 50 MCG/ACT nasal spray Place 2 sprays into both nostrils daily.    Historical Provider, MD  gabapentin (NEURONTIN) 300 MG capsule Take 1 capsule (300 mg total) by mouth 2 (two) times daily. 12/29/14   Loleta Roseory Forbach, MD  levofloxacin (LEVAQUIN) 500 MG tablet Take 1 tablet (500 mg total) by mouth daily. 02/09/15   Katha HammingSnehalatha Konidena, MD  loratadine (CLARITIN) 10 MG tablet Take 10 mg by mouth daily.    Historical Provider, MD  metaxalone (SKELAXIN) 800 MG tablet Take 1 tablet (800 mg total) by mouth 3 (three) times daily. 01/19/15   Katha HammingSnehalatha Konidena, MD  metoprolol succinate (TOPROL-XL) 50 MG 24 hr tablet Take 50 mg by mouth daily.    Historical Provider, MD  pantoprazole (PROTONIX) 40 MG tablet Take 1 tablet (40 mg total) by mouth daily. 12/29/14   Loleta Roseory Forbach, MD  predniSONE (DELTASONE) 20 MG tablet Take 3 tablets (60 mg total) by mouth daily with breakfast. 11/19/15 11/23/15  Nita Sicklearolina Regina Ganci, MD  QUEtiapine (SEROQUEL) 100 MG tablet Take 1 tablet (100 mg total) by mouth at bedtime. 07/18/15   Audery AmelJohn T Clapacs, MD  venlafaxine XR (EFFEXOR-XR) 150 MG 24 hr capsule Take 1 capsule (150 mg total) by mouth daily with breakfast. 01/05/15   Shari ProwsJolanta B Pucilowska, MD    Allergies Review of patient's allergies indicates no known allergies.  Family History  Problem Relation Age of Onset  . CAD Other   . Diabetes Other     Social History Social History  Substance Use Topics  . Smoking status: Current Every Day Smoker    Packs/day: 1.00    Types: Cigarettes  . Smokeless tobacco: Not on file  . Alcohol use No    Review of Systems  Constitutional: Negative for fever. + lightheadedness Eyes: Negative for visual changes. ENT: Negative for sore throat. Cardiovascular: + chest pain. Respiratory: + shortness of breath. Gastrointestinal: Negative for abdominal pain, vomiting. +  diarrhea. Genitourinary: Negative for dysuria. Musculoskeletal: Negative for back pain. Skin: Negative for rash. Neurological: Negative for headaches, weakness or numbness.  ____________________________________________   PHYSICAL EXAM:  VITAL SIGNS: ED Triage Vitals  Enc Vitals Group     BP 11/19/15 1725 131/79     Pulse Rate 11/19/15 1725 (!) 101     Resp 11/19/15 1725 13     Temp 11/19/15 1725 97.8 F (36.6 C)     Temp Source 11/19/15 1725 Oral     SpO2 11/19/15 1725 97 %     Weight 11/19/15 1725 210 lb (95.3 kg)     Height 11/19/15 1725 5\' 7"  (1.702 m)     Head Circumference --      Peak Flow --      Pain Score 11/19/15 1731 10     Pain Loc --      Pain Edu? --  Excl. in GC? --     Constitutional: Alert and oriented, moderate respiratory distress.  HEENT:      Head: Normocephalic and atraumatic.         Eyes: Conjunctivae are normal. Sclera is non-icteric. EOMI. PERRL      Mouth/Throat: Mucous membranes are moist.       Neck: Supple with no signs of meningismus. Cardiovascular: Tachycardic with regular rhythm. No murmurs, gallops, or rubs. 2+ symmetrical distal pulses are present in all extremities. No JVD. Respiratory: Tachypneic, satting 97% on 2 L nasal cannula, diffuse expiratory wheezes bilaterally, no crackles  Gastrointestinal: Obese, diffusely tender to palpation with no guarding or rebound. Genitourinary: No CVA tenderness. Rectal exam showing light brown stool hemoccult negative. Musculoskeletal: Nontender with normal range of motion in all extremities. No edema, cyanosis, or erythema of extremities. Neurologic: Normal speech and language. Face is symmetric. Moving all extremities. No gross focal neurologic deficits are appreciated. Skin: Skin is warm, dry and intact. No rash noted. Psychiatric: Mood and affect are normal. Speech and behavior are normal.  ____________________________________________   LABS (all labs ordered are listed, but only  abnormal results are displayed)  Labs Reviewed  BASIC METABOLIC PANEL - Abnormal; Notable for the following:       Result Value   Glucose, Bld 132 (*)    Calcium 8.7 (*)    All other components within normal limits  CBC - Abnormal; Notable for the following:    RDW 15.3 (*)    All other components within normal limits  HEPATIC FUNCTION PANEL - Abnormal; Notable for the following:    Bilirubin, Direct <0.1 (*)    All other components within normal limits  C DIFFICILE QUICK SCREEN W PCR REFLEX  TROPONIN I  FIBRIN DERIVATIVES D-DIMER ([facility] ONLY)  BRAIN NATRIURETIC PEPTIDE  LIPASE, BLOOD  LACTIC ACID, PLASMA   ____________________________________________  EKG  ED ECG REPORT I, Nita Sickle, the attending physician, personally viewed and interpreted this ECG.  Normal sinus rhythm, rate of 97, normal intervals, normal axis, no ST elevations or depressions. ____________________________________________  RADIOLOGY  CXR:  Stable pulmonary interstitial changes. No acute cardiopulmonary abnormality. ____________________________________________   PROCEDURES  Procedure(s) performed: None Procedures Critical Care performed:  None ____________________________________________   INITIAL IMPRESSION / ASSESSMENT AND PLAN / ED COURSE  50 y.o. male history of bipolar disorder, COPD, chronic chest pain, esophageal stricture, GERD, seizure, hypertension, polysubstance abuse and presents for evaluation of acute left lower chest/ upper abdominal pain associated with lightheadedness and SOB. Patient moderate respiratory distress, acute neck, maintaining his sats on 2 L nasal cannula, diffuse expiratory wheezes bilaterally, abdomen is diffusely tender to palpation. Rectal exam with no evidence of blood. Patient with clear COPD exacerbation at this time we'll treat with Solu-Medrol, 3 breathing treatments. Sudden onset of CP/upper abdominal pain possibly PE, will get d-dimer if positive  will get CTA chest. Patient with significant diffuse tenderness to palpation over the entire abdomen. Plan for CT a/p, will send C. Diff, no recent abx use but patient is from group home.   Clinical Course  Comment By Time  Patient a longer having shortness of breath, no longer wheezing, breathing comfortably with no distress after receiving 3 DuoNeb treatments and Solu-Medrol. Continues to have diffuse abdominal pain and tenderness on palpation. Labs here are within normal limits including CMP, CBC, troponin, d-dimer, BNP. Chest x-ray with no acute findings. We'll pursue CT abdomen and pelvis and also check for C. difficile colitis. Care transferred to Dr.  Quale Nita Sickle, MD 09/04 [phone]    Pertinent labs & imaging results that were available during my care of the patient were reviewed by me and considered in my medical decision making (see chart for details).    ____________________________________________   FINAL CLINICAL IMPRESSION(S) / ED DIAGNOSES  Final diagnoses:  COPD exacerbation (HCC)  Generalized abdominal pain  Diarrhea, unspecified type      NEW MEDICATIONS STARTED DURING THIS VISIT:  New Prescriptions   PREDNISONE (DELTASONE) 20 MG TABLET    Take 3 tablets (60 mg total) by mouth daily with breakfast.     Note:  This document was prepared using Dragon voice recognition software and may include unintentional dictation errors.    Nita Sickle, MD 11/19/15 2002

## 2015-12-03 ENCOUNTER — Encounter: Payer: Self-pay | Admitting: Emergency Medicine

## 2015-12-03 ENCOUNTER — Emergency Department: Payer: Medicaid Other

## 2015-12-03 ENCOUNTER — Emergency Department
Admission: EM | Admit: 2015-12-03 | Discharge: 2015-12-03 | Disposition: A | Discharge status: Home / Self Care | Payer: Medicaid Other | Attending: Student in an Organized Health Care Education/Training Program | Admitting: Student in an Organized Health Care Education/Training Program

## 2015-12-03 DIAGNOSIS — R1084 Generalized abdominal pain: Secondary | ICD-10-CM

## 2015-12-03 DIAGNOSIS — M791 Myalgia: Secondary | ICD-10-CM | POA: Insufficient documentation

## 2015-12-03 DIAGNOSIS — F17210 Nicotine dependence, cigarettes, uncomplicated: Secondary | ICD-10-CM | POA: Diagnosis not present

## 2015-12-03 DIAGNOSIS — R0602 Shortness of breath: Secondary | ICD-10-CM | POA: Insufficient documentation

## 2015-12-03 DIAGNOSIS — J45909 Unspecified asthma, uncomplicated: Secondary | ICD-10-CM | POA: Insufficient documentation

## 2015-12-03 DIAGNOSIS — K5900 Constipation, unspecified: Secondary | ICD-10-CM | POA: Diagnosis not present

## 2015-12-03 DIAGNOSIS — Z79899 Other long term (current) drug therapy: Secondary | ICD-10-CM | POA: Diagnosis not present

## 2015-12-03 DIAGNOSIS — I10 Essential (primary) hypertension: Secondary | ICD-10-CM | POA: Insufficient documentation

## 2015-12-03 LAB — CBC WITH DIFFERENTIAL/PLATELET
Basophils Absolute: 0.1 K/uL (ref 0–0.1)
Basophils Relative: 1 %
Eosinophils Absolute: 0.2 K/uL (ref 0–0.7)
Eosinophils Relative: 2 %
HCT: 46.9 % (ref 40.0–52.0)
Hemoglobin: 15.6 g/dL (ref 13.0–18.0)
Lymphocytes Relative: 12 %
Lymphs Abs: 1.4 K/uL (ref 1.0–3.6)
MCH: 29.4 pg (ref 26.0–34.0)
MCHC: 33.3 g/dL (ref 32.0–36.0)
MCV: 88.1 fL (ref 80.0–100.0)
Monocytes Absolute: 1.3 K/uL — ABNORMAL HIGH (ref 0.2–1.0)
Monocytes Relative: 11 %
Neutro Abs: 9.3 K/uL — ABNORMAL HIGH (ref 1.4–6.5)
Neutrophils Relative %: 74 %
Platelets: 129 K/uL — ABNORMAL LOW (ref 150–440)
RBC: 5.32 MIL/uL (ref 4.40–5.90)
RDW: 16.7 % — ABNORMAL HIGH (ref 11.5–14.5)
WBC: 12.4 K/uL — ABNORMAL HIGH (ref 3.8–10.6)

## 2015-12-03 LAB — BASIC METABOLIC PANEL WITH GFR
Anion gap: 9 (ref 5–15)
BUN: 19 mg/dL (ref 6–20)
CO2: 33 mmol/L — ABNORMAL HIGH (ref 22–32)
Calcium: 8.8 mg/dL — ABNORMAL LOW (ref 8.9–10.3)
Chloride: 97 mmol/L — ABNORMAL LOW (ref 101–111)
Creatinine, Ser: 1.03 mg/dL (ref 0.61–1.24)
GFR calc Af Amer: >60 mL/min
GFR calc non Af Amer: >60 mL/min
Glucose, Bld: 179 mg/dL — ABNORMAL HIGH (ref 65–99)
Potassium: 2.8 mmol/L — ABNORMAL LOW (ref 3.5–5.1)
Sodium: 139 mmol/L (ref 135–145)

## 2015-12-03 MED ORDER — DIFLUNISAL 500 MG PO TABS: 500.0000 mg | ORAL_TABLET | Freq: Two times a day (BID) | ORAL | 0 refills | Status: AC | PRN

## 2015-12-03 MED ORDER — POLYETHYLENE GLYCOL 3350 17 G PO PACK: 17.0000 g | PACK | Freq: Every day | ORAL | 0 refills | Status: AC

## 2015-12-03 MED ORDER — POTASSIUM CHLORIDE CRYS ER 20 MEQ PO TBCR
40.0000 meq | EXTENDED_RELEASE_TABLET | Freq: Once | ORAL | Status: AC
  Administered 2015-12-03: 40 meq via ORAL
  Filled 2015-12-03: qty 2

## 2015-12-03 MED ORDER — DIAZEPAM 5 MG PO TABS
5.0000 mg | ORAL_TABLET | Freq: Once | ORAL | Status: AC
  Administered 2015-12-03: 5 mg via ORAL
  Filled 2015-12-03: qty 1

## 2015-12-03 MED ORDER — OXYCODONE-ACETAMINOPHEN 5-325 MG PO TABS
2.0000 | ORAL_TABLET | Freq: Once | ORAL | Status: AC
  Administered 2015-12-03: 2 via ORAL
  Filled 2015-12-03: qty 2

## 2015-12-03 NOTE — ED Triage Notes (Addendum)
Pt presents to ED with c/o SOB, rash and urine incontinent since Friday. Pt thinks it might be from Seroquel he started taking on the same day. Also reports feeling very anxious.

## 2015-12-03 NOTE — ED Notes (Signed)
Discharge instructions reviewed with patient. Patient verbalized understanding. Patient taken to lobby via wheelchair without difficulty 

## 2015-12-03 NOTE — ED Provider Notes (Signed)
[facility] Emergency Department Provider Note        Time seen: ----------------------------------------- 7:00 PM on 12/03/2015 -----------------------------------------    I have reviewed the triage vital signs and the nursing notes.   HISTORY  Chief Complaint Allergic Reaction    HPI [name] is a 50 y.o. male who presents to ER with shortness of breath, rash and urine incontinence since Friday. Patient states he might be from Seroquel and Lipitor that he started taking on Friday. Patient states he's not had a dose of these medicines in the last 3 days. He is reports feeling very anxious as well. Patient has diffuse pain, nothing makes it better or worse.   Past Medical History:  Diagnosis Date  . Anxiety   . Asthma   . Bipolar disorder (HCC)   . Chronic pain   . Esophageal stricture   . GERD (gastroesophageal reflux disease)   . H/O tonic-clonic seizures   . Hypertension   . Polysubstance abuse     Patient Active Problem List   Diagnosis Date Noted  . Bipolar affective disorder, current episode hypomanic (HCC)   . Bipolar disorder (HCC) 07/17/2015  . Suicidal ideation 07/17/2015  . Pneumonia 02/05/2015  . Chest pain 01/31/2015  . Left sided chest pain 01/19/2015  . Adjustment disorder with depressed mood 01/15/2015  . Dyslipidemia 01/03/2015  . Tobacco use disorder 01/03/2015  . Hypertension 01/02/2015  . Gastric reflux 01/02/2015    Past Surgical History:  Procedure Laterality Date  . APPENDECTOMY     complicated by perforation and abscess  . ESOPHAGOGASTRODUODENOSCOPY (EGD) WITH ESOPHAGEAL DILATION    . HERNIA REPAIR  Nov 2016   ventral hernia repair at [facility] by Dr. Genene Churn  . PEG TUBE REMOVAL    . PEG W/TRACHEOSTOMY PLACEMENT    . TRACHEOSTOMY CLOSURE      Allergies Review of patient's allergies indicates no known allergies.  Social History Social History  Substance Use Topics  . Smoking status: Current Every  Day Smoker    Packs/day: 1.00    Types: Cigarettes  . Smokeless tobacco: Never Used  . Alcohol use No    Review of Systems Constitutional: Negative for fever. Cardiovascular: Negative for chest pain. Respiratory: Positive for shortness of breath Gastrointestinal: Positive for abdominal pain Genitourinary: Negative for dysuria. Musculoskeletal: Positive for myalgias Skin: Negative for rash. Neurological: Negative for headaches, focal weakness or numbness.  10-point ROS otherwise negative.  ____________________________________________   PHYSICAL EXAM:  VITAL SIGNS: ED Triage Vitals  Enc Vitals Group     BP 12/03/15 1750 131/79     Pulse Rate 12/03/15 1750 98     Resp 12/03/15 1750 20     Temp 12/03/15 1750 98 F (36.7 C)     Temp Source 12/03/15 1750 Oral     SpO2 12/03/15 1750 91 %     Weight 12/03/15 1751 218 lb (98.9 kg)     Height 12/03/15 1751 5\' 7"  (1.702 m)     Head Circumference --      Peak Flow --      Pain Score 12/03/15 1751 10     Pain Loc --      Pain Edu? --      Excl. in GC? --     Constitutional: Alert and oriented. Anxious, mild distress Eyes: Conjunctivae are normal. PERRL. Normal extraocular movements. ENT   Head: Normocephalic and atraumatic.   Nose: No congestion/rhinnorhea.   Mouth/Throat: Mucous membranes are moist.  Neck: No stridor. Cardiovascular: Normal rate, regular rhythm. No murmurs, rubs, or gallops. Respiratory: Normal respiratory effort without tachypnea nor retractions. Breath sounds are clear and equal bilaterally. No wheezes/rales/rhonchi. Gastrointestinal: Distended, nontender, normal bowel sounds Musculoskeletal: Nontender with normal range of motion in all extremities. No lower extremity tenderness nor edema. Neurologic:  Normal speech and language. No gross focal neurologic deficits are appreciated.  Skin:  Skin is warm, dry and intact. No rash noted. Psychiatric: Bizarre mood and affect at  times. ____________________________________________  EKG: Interpreted by me. Sinus rhythm rate of 90 bpm, normal PR interval, normal QRS, normal QT interval. Normal axis.  ____________________________________________  ED COURSE:  Pertinent labs & imaging results that were available during my care of the patient were reviewed by me and considered in my medical decision making (see chart for details). Clinical Course  Patient presents to ER in mild distress from uncertain etiology. He has numerous symptoms. I will give him oral pain medicine and anxiolytics. We will check basic labs and imaging.  Procedures ____________________________________________   LABS (pertinent positives/negatives)  Labs Reviewed  BASIC METABOLIC PANEL - Abnormal; Notable for the following:       Result Value   Potassium 2.8 (*)    Chloride 97 (*)    CO2 33 (*)    Glucose, Bld 179 (*)    Calcium 8.8 (*)    All other components within normal limits  CBC WITH DIFFERENTIAL/PLATELET - Abnormal; Notable for the following:    WBC 12.4 (*)    RDW 16.7 (*)    Platelets 129 (*)    Neutro Abs 9.3 (*)    Monocytes Absolute 1.3 (*)    All other components within normal limits  URINE DRUG SCREEN, QUALITATIVE ([facility] ONLY)    RADIOLOGY Images were viewed by me  Acute abdominal series IMPRESSION: Diffuse stool throughout colon. No bowel obstruction or free air. Diffuse reticulonodular interstitial lung disease, stable. No edema or consolidation. ____________________________________________  FINAL ASSESSMENT AND PLAN  Pain, constipation, possible medication reaction  Plan: Patient with labs and imaging as dictated above. Patient is in no distress, no clear etiology for his multitude of symptoms. We did give him 1 dose of oral potassium, he'll be discharged with MiraLAX and anti-inflammatory medications. Currently his symptoms are improved. He is stable for outpatient follow-up.   Emily FilbertWilliams, Sanjith Siwek E,  MD   Note: This dictation was prepared with Dragon dictation. Any transcriptional errors that result from this process are unintentional    Emily FilbertJonathan E Mouna Yager, MD 12/03/15 2007

## 2015-12-04 ENCOUNTER — Emergency Department
Admission: EM | Admit: 2015-12-04 | Discharge: 2015-12-04 | Disposition: A | Discharge status: Home / Self Care | Payer: Medicaid Other | Attending: Emergency Medicine | Admitting: Emergency Medicine

## 2015-12-04 ENCOUNTER — Encounter: Payer: Self-pay | Admitting: Emergency Medicine

## 2015-12-04 ENCOUNTER — Emergency Department: Payer: Medicaid Other

## 2015-12-04 DIAGNOSIS — F419 Anxiety disorder, unspecified: Secondary | ICD-10-CM | POA: Diagnosis not present

## 2015-12-04 DIAGNOSIS — F17210 Nicotine dependence, cigarettes, uncomplicated: Secondary | ICD-10-CM | POA: Diagnosis not present

## 2015-12-04 DIAGNOSIS — J45909 Unspecified asthma, uncomplicated: Secondary | ICD-10-CM | POA: Insufficient documentation

## 2015-12-04 DIAGNOSIS — Z79899 Other long term (current) drug therapy: Secondary | ICD-10-CM | POA: Diagnosis not present

## 2015-12-04 DIAGNOSIS — I10 Essential (primary) hypertension: Secondary | ICD-10-CM | POA: Diagnosis not present

## 2015-12-04 DIAGNOSIS — R0789 Other chest pain: Secondary | ICD-10-CM | POA: Diagnosis present

## 2015-12-04 LAB — CBC WITH DIFFERENTIAL/PLATELET
Basophils Absolute: 0 10*3/uL (ref 0–0.1)
Basophils Relative: 0 %
EOS PCT: 1 %
Eosinophils Absolute: 0.1 10*3/uL (ref 0–0.7)
HCT: 45.8 % (ref 40.0–52.0)
HEMOGLOBIN: 15.3 g/dL (ref 13.0–18.0)
LYMPHS ABS: 0.7 10*3/uL — AB (ref 1.0–3.6)
LYMPHS PCT: 7 %
MCH: 29.1 pg (ref 26.0–34.0)
MCHC: 33.3 g/dL (ref 32.0–36.0)
MCV: 87.4 fL (ref 80.0–100.0)
Monocytes Absolute: 1.3 10*3/uL — ABNORMAL HIGH (ref 0.2–1.0)
Monocytes Relative: 11 %
NEUTROS PCT: 81 %
Neutro Abs: 9.1 10*3/uL — ABNORMAL HIGH (ref 1.4–6.5)
Platelets: 119 10*3/uL — ABNORMAL LOW (ref 150–440)
RBC: 5.25 MIL/uL (ref 4.40–5.90)
RDW: 16.3 % — ABNORMAL HIGH (ref 11.5–14.5)
WBC: 11.2 10*3/uL — AB (ref 3.8–10.6)

## 2015-12-04 LAB — URINALYSIS COMPLETE WITH MICROSCOPIC (ARMC ONLY)
BILIRUBIN URINE: NEGATIVE
Bacteria, UA: NONE SEEN
GLUCOSE, UA: 50 mg/dL — AB
Hgb urine dipstick: NEGATIVE
KETONES UR: NEGATIVE mg/dL
Leukocytes, UA: NEGATIVE
Nitrite: NEGATIVE
Protein, ur: NEGATIVE mg/dL
Specific Gravity, Urine: 1.02 (ref 1.005–1.030)
Squamous Epithelial / LPF: NONE SEEN
pH: 6 (ref 5.0–8.0)

## 2015-12-04 LAB — BASIC METABOLIC PANEL
ANION GAP: 5 (ref 5–15)
BUN: 17 mg/dL (ref 6–20)
CO2: 39 mmol/L — AB (ref 22–32)
Calcium: 8.6 mg/dL — ABNORMAL LOW (ref 8.9–10.3)
Chloride: 94 mmol/L — ABNORMAL LOW (ref 101–111)
Creatinine, Ser: 0.98 mg/dL (ref 0.61–1.24)
GFR calc Af Amer: >60 mL/min (ref >60)
GFR calc non Af Amer: >60 mL/min (ref >60)
GLUCOSE: 127 mg/dL — AB (ref 65–99)
POTASSIUM: 3.4 mmol/L — AB (ref 3.5–5.1)
Sodium: 138 mmol/L (ref 135–145)

## 2015-12-04 LAB — TROPONIN I: Troponin I: <0.03 ng/mL (ref <0.03)

## 2015-12-04 MED ORDER — IPRATROPIUM BROMIDE 0.02 % IN SOLN
0.5000 mg | Freq: Once | RESPIRATORY_TRACT | Status: AC
  Administered 2015-12-04: 0.5 mg via RESPIRATORY_TRACT
  Filled 2015-12-04: qty 2.5

## 2015-12-04 MED ORDER — LORAZEPAM 2 MG/ML IJ SOLN
1.0000 mg | Freq: Once | INTRAMUSCULAR | Status: AC
  Administered 2015-12-04: 1 mg via INTRAVENOUS
  Filled 2015-12-04: qty 1

## 2015-12-04 MED ORDER — ALBUTEROL SULFATE (2.5 MG/3ML) 0.083% IN NEBU
5.0000 mg | INHALATION_SOLUTION | Freq: Once | RESPIRATORY_TRACT | Status: AC
  Administered 2015-12-04: 5 mg via RESPIRATORY_TRACT
  Filled 2015-12-04: qty 6

## 2015-12-04 MED ORDER — IPRATROPIUM-ALBUTEROL 0.5-2.5 (3) MG/3ML IN SOLN
RESPIRATORY_TRACT | Status: AC
  Filled 2015-12-04: qty 3

## 2015-12-04 NOTE — ED Provider Notes (Addendum)
[facility] Emergency Department Provider Note  ____________________________________________   I have reviewed the triage vital signs and the nursing notes.   HISTORY  Chief Complaint My whole body hurts and I needs some Percocet and Ativan  HPI [name] is a 50 y.o. male states he is having a panic attack which is resulted in his entire body hurting. When I asked him specifically he states his chest hurts his abdomen hurts his back hurts and all of his extremities hurt and he has a mild headache. In short, his entire body. Patient states that he has not actually incontinent of urine he just hasn't been able quite get to the bathroom because his whole body hurts. He denies any fever or chills. He states that he had a normal bowel movement yesterday. He also states that yesterday he felt much better than he has in some time because he was given Percocet and benzos here and he feels very strongly that Percocet and benzo will help him feel better again. Patient also states that he is having a panic attack and that is the central problem. We do note the patient has chronic chest pain chronic multiple other issues including home oxygen use. He does not appear to be short of breath. I further note the patient has had multiple different CT scans here in the last 2 years that of which infiltrated anything of any acuity aside from one point he had some GI colitis. This does include multiple CT scans of the abdomen multiple CT scans of the chest. He does state that he recently stopped taking his psychiatric medications because he wanted to think more clearly, he denies any SI or HI.     Past Medical History:  Diagnosis Date  . Anxiety   . Asthma   . Bipolar disorder ([place])   . Chronic pain   . Esophageal stricture   . GERD (gastroesophageal reflux disease)   . H/O tonic-clonic seizures   . Hypertension   . Polysubstance abuse     Patient Active Problem List    Diagnosis Date Noted  . Bipolar affective disorder, current episode hypomanic ([place])   . Bipolar disorder ([place]) 07/17/2015  . Suicidal ideation 07/17/2015  . Pneumonia 02/05/2015  . Chest pain 01/31/2015  . Left sided chest pain 01/19/2015  . Adjustment disorder with depressed mood 01/15/2015  . Dyslipidemia 01/03/2015  . Tobacco use disorder 01/03/2015  . Hypertension 01/02/2015  . Gastric reflux 01/02/2015    Past Surgical History:  Procedure Laterality Date  . APPENDECTOMY     complicated by perforation and abscess  . ESOPHAGOGASTRODUODENOSCOPY (EGD) WITH ESOPHAGEAL DILATION    . HERNIA REPAIR  Nov 2016   ventral hernia repair at [facility] by Dr. Tod Persia  . PEG TUBE REMOVAL    . PEG W/TRACHEOSTOMY PLACEMENT    . TRACHEOSTOMY CLOSURE      Prior to Admission medications   Medication Sig Start Date End Date Taking? Authorizing Provider  albuterol (PROVENTIL HFA;VENTOLIN HFA) 108 (90 BASE) MCG/ACT inhaler Inhale 2 puffs into the lungs every 6 (six) hours as needed for wheezing or shortness of breath. 02/09/15   Epifanio Lesches, MD  atorvastatin (LIPITOR) 10 MG tablet Take 10 mg by mouth every evening.    Historical Provider, MD  azithromycin (ZITHROMAX) 250 MG tablet Take 2 tablets PO on day 1, then take 1 tablet PO daily for 4 more days 11/10/15   Hinda Kehr, MD  benzonatate (TESSALON) 200 MG capsule Take  1 capsule (200 mg total) by mouth 3 (three) times daily as needed for cough. 01/20/15   Aldean Jewett, MD  busPIRone (BUSPAR) 15 MG tablet Take 1 tablet (15 mg total) by mouth 3 (three) times daily. 12/29/14   Hinda Kehr, MD  clonazePAM (KLONOPIN) 0.5 MG tablet Take 1 tablet (0.5 mg total) by mouth 3 (three) times daily. 07/18/15   Gonzella Lex, MD  diflunisal (DOLOBID) 500 MG TABS tablet Take 1 tablet (500 mg total) by mouth 2 (two) times daily as needed. 12/03/15   Earleen Newport, MD  diltiazem (CARDIZEM) 30 MG tablet Take 1 tablet (30 mg total) by mouth 4 (four) times  daily. 12/29/14   Hinda Kehr, MD  FLUoxetine (PROZAC) 20 MG capsule Take 1 capsule (20 mg total) by mouth daily. 07/18/15   Gonzella Lex, MD  fluticasone (FLONASE) 50 MCG/ACT nasal spray Place 2 sprays into both nostrils daily.    Historical Provider, MD  gabapentin (NEURONTIN) 300 MG capsule Take 1 capsule (300 mg total) by mouth 2 (two) times daily. 12/29/14   Hinda Kehr, MD  levofloxacin (LEVAQUIN) 500 MG tablet Take 1 tablet (500 mg total) by mouth daily. 02/09/15   Epifanio Lesches, MD  loratadine (CLARITIN) 10 MG tablet Take 10 mg by mouth daily.    Historical Provider, MD  metaxalone (SKELAXIN) 800 MG tablet Take 1 tablet (800 mg total) by mouth 3 (three) times daily. 01/19/15   Epifanio Lesches, MD  metoprolol succinate (TOPROL-XL) 50 MG 24 hr tablet Take 50 mg by mouth daily.    Historical Provider, MD  pantoprazole (PROTONIX) 40 MG tablet Take 1 tablet (40 mg total) by mouth daily. 12/29/14   Hinda Kehr, MD  polyethylene glycol (MIRALAX / GLYCOLAX) packet Take 17 g by mouth daily. 12/03/15   Earleen Newport, MD  QUEtiapine (SEROQUEL) 100 MG tablet Take 1 tablet (100 mg total) by mouth at bedtime. 07/18/15   Gonzella Lex, MD  venlafaxine XR (EFFEXOR-XR) 150 MG 24 hr capsule Take 1 capsule (150 mg total) by mouth daily with breakfast. 01/05/15   Clovis Fredrickson, MD    Allergies Review of patient's allergies indicates no known allergies.  Family History  Problem Relation Age of Onset  . CAD Other   . Diabetes Other     Social History Social History  Substance Use Topics  . Smoking status: Current Every Day Smoker    Packs/day: 1.00    Types: Cigarettes  . Smokeless tobacco: Never Used  . Alcohol use No    Review of Systems Constitutional: No fever/chills Eyes: No visual changes. ENT: No sore throat. No stiff neck no neck pain Cardiovascular: Positive chest pain. Respiratory: Denies shortness of breath. Gastrointestinal:   no vomiting.  No diarrhea.  No  constipation. Genitourinary: Negative for dysuria. Musculoskeletal: Negative lower extremity swelling Skin: Negative for rash. Neurological: Negative for severe headaches, focal weakness or numbness. 10-point ROS otherwise negative.  ____________________________________________   PHYSICAL EXAM:  VITAL SIGNS: ED Triage Vitals [12/04/15 0841]  Enc Vitals Group     BP (!) 144/102     Pulse Rate 88     Resp 18     Temp 97.8 F (36.6 C)     Temp Source Oral     SpO2 98 %     Weight 218 lb (98.9 kg)     Height '5\' 7"'  (1.702 m)     Head Circumference      Peak Flow  Pain Score      Pain Loc      Pain Edu?      Excl. in [place]?     Constitutional: Alert and oriented. Well appearing and in no acute distress.He is anxious and upset Eyes: Conjunctivae are normal. PERRL. EOMI. Head: Atraumatic. Nose: No congestion/rhinnorhea. Mouth/Throat: Mucous membranes are moist.  Oropharynx non-erythematous. Neck: No stridor.   Nontender with no meningismus Cardiovascular: Normal rate, regular rhythm. Grossly normal heart sounds.  Good peripheral circulation. Respiratory: Normal respiratory effort.  No retractions. Lungs CTAB. Abdominal: Soft and nontender. No distention. No guarding no rebound GU: Normal external male genitalia with no obvious lesions or tenderness Back:  There is no focal tenderness or step off.  there is no midline tenderness there are no lesions noted. there is no CVA tenderness Musculoskeletal: No lower extremity tenderness, no upper extremity tenderness. No joint effusions, no DVT signs strong distal pulses no edema Neurologic:  Normal speech and language. No gross focal neurologic deficits are appreciated.  Skin:  Skin is warm, dry and intact. No rash noted. Psychiatric: Mood and affect are anxious and upset. Speech and behavior are normal.  ____________________________________________   LABS (all labs ordered are listed, but only abnormal results are  displayed)  Labs Reviewed  CBC WITH DIFFERENTIAL/PLATELET  TROPONIN I  BASIC METABOLIC PANEL  URINALYSIS COMPLETEWITH MICROSCOPIC ([place])   ____________________________________________  EKG  I personally interpreted any EKGs ordered by me or triage Rate 89 bpm no acute ST elevation or acute ST depressions sinus rhythm normal axis and Non-actionable EKG ____________________________________________  RADIOLOGY  I reviewed any imaging ordered by me or triage that were performed during my shift and, if possible, patient and/or family made aware of any abnormal findings. ____________________________________________   PROCEDURES  Procedure(s) performed: None  Procedures  Critical Care performed: None  ____________________________________________   INITIAL IMPRESSION / ASSESSMENT AND PLAN / ED COURSE  Pertinent labs & imaging results that were available during my care of the patient were reviewed by me and considered in my medical decision making (see chart for details).  Patient with history of bipolar disorder he has no SI or HI but he feels very anxious and upset. He has a myriad of different complaints as he often does. At this time his physical exam is reassuring. We will check blood work and see if there is something else going on. I'm reluctant to give him narcotic pain medication I will, however, give him some anxiolytic medications   ----------------------------------------- 11:36 AM on 12/04/2015 -----------------------------------------  Awaiting urinalysis. We were pleased to see that when we asked the patient to urinate he was able to urinate with no difficulty and had no evidence of incontinence at any time during his stay thus far. Did have some wheezing which is not unusual for we are giving him nebulizers. No evidence of respiratory distress. He is feeling better after anxiolytic medication.  Clinical Course    ____________________________________________   FINAL CLINICAL IMPRESSION(S) / ED DIAGNOSES  Final diagnoses:  None      This chart was dictated using voice recognition software.  Despite best efforts to proofread,  errors can occur which can change meaning.      Schuyler Amor, MD 12/04/15 [place], MD 12/04/15 [place], MD 12/04/15 [phone]

## 2015-12-04 NOTE — ED Triage Notes (Addendum)
Pt states that he has had incontinence every 10-15 minutes since Friday.  Pt also complains of pain when trying to move arms.  Pt was supposed to see Dr. Meredeth IdeFleming today to follow up on constipation issue.  Dr. Meredeth IdeFleming sent patient to ED due to urinary issues.  Pt had hernia repaired and appendix removed in the past and states that he has had constipation issues since then.  Patient states that he is hurting in his chest and complains of n/v/dizziness. Pt was seen in ED last night with same symptoms. Pt has hx of anxiety attacks.

## 2015-12-04 NOTE — ED Notes (Signed)
Pt given crackers and water. 

## 2015-12-04 NOTE — ED Notes (Signed)
EDP at bedside, pt states feeling anxious, was seen in ED last night, pt awake and alert

## 2015-12-05 ENCOUNTER — Telehealth: Payer: Self-pay | Admitting: Emergency Medicine

## 2015-12-05 NOTE — Telephone Encounter (Signed)
Pharmacy called asking for alternative to dolobid due to  Need preauthorization for that.  Per dr Huel Cotequigley we would give bentyl instead.  Pharmacy says that is contraindicated due to myasthenia gravis.  Per dr Huel Cotequigley patient can take otc motrin or tylenol as needed and needs to follow up with pcp.

## 2015-12-08 ENCOUNTER — Emergency Department
Admission: EM | Admit: 2015-12-08 | Discharge: 2015-12-08 | Disposition: A | Discharge status: Home / Self Care | Payer: Medicaid Other | Attending: Emergency Medicine | Admitting: Emergency Medicine

## 2015-12-08 ENCOUNTER — Encounter: Payer: Self-pay | Admitting: Emergency Medicine

## 2015-12-08 DIAGNOSIS — R1084 Generalized abdominal pain: Secondary | ICD-10-CM

## 2015-12-08 DIAGNOSIS — B356 Tinea cruris: Secondary | ICD-10-CM | POA: Insufficient documentation

## 2015-12-08 DIAGNOSIS — Z79899 Other long term (current) drug therapy: Secondary | ICD-10-CM | POA: Insufficient documentation

## 2015-12-08 DIAGNOSIS — I10 Essential (primary) hypertension: Secondary | ICD-10-CM | POA: Insufficient documentation

## 2015-12-08 DIAGNOSIS — F17210 Nicotine dependence, cigarettes, uncomplicated: Secondary | ICD-10-CM | POA: Diagnosis not present

## 2015-12-08 DIAGNOSIS — R1030 Lower abdominal pain, unspecified: Secondary | ICD-10-CM | POA: Diagnosis present

## 2015-12-08 DIAGNOSIS — J45909 Unspecified asthma, uncomplicated: Secondary | ICD-10-CM | POA: Insufficient documentation

## 2015-12-08 LAB — COMPREHENSIVE METABOLIC PANEL
ALT: 33 U/L (ref 17–63)
ANION GAP: 8 (ref 5–15)
AST: 29 U/L (ref 15–41)
Albumin: 3.6 g/dL (ref 3.5–5.0)
Alkaline Phosphatase: 53 U/L (ref 38–126)
BUN: 14 mg/dL (ref 6–20)
CHLORIDE: 97 mmol/L — AB (ref 101–111)
CO2: 33 mmol/L — ABNORMAL HIGH (ref 22–32)
Calcium: 8.7 mg/dL — ABNORMAL LOW (ref 8.9–10.3)
Creatinine, Ser: 1.1 mg/dL (ref 0.61–1.24)
GFR calc Af Amer: >60 mL/min (ref >60)
Glucose, Bld: 186 mg/dL — ABNORMAL HIGH (ref 65–99)
Potassium: 3.2 mmol/L — ABNORMAL LOW (ref 3.5–5.1)
Sodium: 138 mmol/L (ref 135–145)
TOTAL PROTEIN: 7.2 g/dL (ref 6.5–8.1)
Total Bilirubin: 0.7 mg/dL (ref 0.3–1.2)

## 2015-12-08 LAB — CBC WITH DIFFERENTIAL/PLATELET
BASOS ABS: 0.1 10*3/uL (ref 0–0.1)
BASOS PCT: 1 %
EOS ABS: 0 10*3/uL (ref 0–0.7)
Eosinophils Relative: 0 %
HEMATOCRIT: 44 % (ref 40.0–52.0)
HEMOGLOBIN: 14.8 g/dL (ref 13.0–18.0)
Lymphocytes Relative: 4 %
Lymphs Abs: 0.4 10*3/uL — ABNORMAL LOW (ref 1.0–3.6)
MCH: 29.6 pg (ref 26.0–34.0)
MCHC: 33.5 g/dL (ref 32.0–36.0)
MCV: 88.2 fL (ref 80.0–100.0)
MONOS PCT: 5 %
Monocytes Absolute: 0.4 10*3/uL (ref 0.2–1.0)
NEUTROS ABS: 9 10*3/uL — AB (ref 1.4–6.5)
NEUTROS PCT: 90 %
Platelets: 131 10*3/uL — ABNORMAL LOW (ref 150–440)
RBC: 4.99 MIL/uL (ref 4.40–5.90)
RDW: 16.6 % — ABNORMAL HIGH (ref 11.5–14.5)
WBC: 9.9 10*3/uL (ref 3.8–10.6)

## 2015-12-08 LAB — LIPASE, BLOOD: LIPASE: 23 U/L (ref 11–51)

## 2015-12-08 LAB — URINALYSIS COMPLETE WITH MICROSCOPIC (ARMC ONLY)
BACTERIA UA: NONE SEEN
Bilirubin Urine: NEGATIVE
Glucose, UA: 150 mg/dL — AB
HGB URINE DIPSTICK: NEGATIVE
LEUKOCYTES UA: NEGATIVE
NITRITE: NEGATIVE
PROTEIN: 30 mg/dL — AB
SPECIFIC GRAVITY, URINE: 1.024 (ref 1.005–1.030)
pH: 7 (ref 5.0–8.0)

## 2015-12-08 LAB — TROPONIN I

## 2015-12-08 MED ORDER — LORAZEPAM 2 MG PO TABS
2.0000 mg | ORAL_TABLET | Freq: Once | ORAL | Status: AC
  Administered 2015-12-08: 2 mg via ORAL
  Filled 2015-12-08: qty 1

## 2015-12-08 MED ORDER — FLUCONAZOLE 100 MG PO TABS
100.0000 mg | ORAL_TABLET | Freq: Once | ORAL | Status: AC
  Administered 2015-12-08: 100 mg via ORAL
  Filled 2015-12-08: qty 1

## 2015-12-08 MED ORDER — HYDROCODONE-ACETAMINOPHEN 5-325 MG PO TABS
1.0000 | ORAL_TABLET | Freq: Once | ORAL | Status: AC
  Administered 2015-12-08: 1 via ORAL
  Filled 2015-12-08: qty 1

## 2015-12-08 NOTE — ED Notes (Signed)
Pt given snack. 

## 2015-12-08 NOTE — ED Provider Notes (Signed)
[facility] Emergency Department Provider Note   ____________________________________________   First MD Initiated Contact with Patient 12/08/15 1006     (approximate)  I have reviewed the triage vital signs and the nursing notes.   HISTORY  Chief Complaint Abdominal Pain and Groin Pain   HPI [name] is a 50 y.o. male patient complains of  anxiety. He says anxiety occasionally makes him have to PE. In several days ago he was in church and he got anxious in all the people and had the PE and because it was so crowded couldn't get out and then developed pain on himself. This made him even more anxious. He also complains of pain in his lower abdomen he has had multiple abdominal x-rays can CAT scans which except for one time only which showed colitis have not been helpful. He's also had multiple CTs of his chest. Patient's worst complaint is that his groin is burning. He has obvious tinea cruris.   Past Medical History:  Diagnosis Date  . Anxiety   . Asthma   . Bipolar disorder ([facility])   . Chronic pain   . Esophageal stricture   . GERD (gastroesophageal reflux disease)   . H/O tonic-clonic seizures   . Hypertension   . Polysubstance abuse     Patient Active Problem List   Diagnosis Date Noted  . Bipolar affective disorder, current episode hypomanic ([facility])   . Bipolar disorder ([facility]) 07/17/2015  . Suicidal ideation 07/17/2015  . Pneumonia 02/05/2015  . Chest pain 01/31/2015  . Left sided chest pain 01/19/2015  . Adjustment disorder with depressed mood 01/15/2015  . Dyslipidemia 01/03/2015  . Tobacco use disorder 01/03/2015  . Hypertension 01/02/2015  . Gastric reflux 01/02/2015    Past Surgical History:  Procedure Laterality Date  . APPENDECTOMY     complicated by perforation and abscess  . ESOPHAGOGASTRODUODENOSCOPY (EGD) WITH ESOPHAGEAL DILATION    . HERNIA REPAIR  Nov 2016   [facility] by Dr. Genene ChurnBunzendahl  . PEG TUBE  REMOVAL    . PEG W/TRACHEOSTOMY PLACEMENT    . TRACHEOSTOMY CLOSURE      Prior to Admission medications   Medication Sig Start Date End Date Taking? Authorizing Provider  albuterol (PROVENTIL HFA;VENTOLIN HFA) 108 (90 BASE) MCG/ACT inhaler Inhale 2 puffs into the lungs every 6 (six) hours as needed for wheezing or shortness of breath. 02/09/15   Katha HammingSnehalatha Konidena, MD  atorvastatin (LIPITOR) 10 MG tablet Take 10 mg by mouth every evening.    Historical Provider, MD  azithromycin (ZITHROMAX) 250 MG tablet Take 2 tablets PO on day 1, then take 1 tablet PO daily for 4 more days 11/10/15   Loleta Roseory Forbach, MD  busPIRone (BUSPAR) 15 MG tablet Take 1 tablet (15 mg total) by mouth 3 (three) times daily. 12/29/14   Loleta Roseory Forbach, MD  clonazePAM (KLONOPIN) 0.5 MG tablet Take 1 tablet (0.5 mg total) by mouth 3 (three) times daily. 07/18/15   Audery AmelJohn T Clapacs, MD  diflunisal (DOLOBID) 500 MG TABS tablet Take 1 tablet (500 mg total) by mouth 2 (two) times daily as needed. 12/03/15   Emily FilbertJonathan E Williams, MD  diltiazem (CARDIZEM) 30 MG tablet Take 1 tablet (30 mg total) by mouth 4 (four) times daily. 12/29/14   Loleta Roseory Forbach, MD  fenofibrate (TRICOR) 145 MG tablet Take 145 mg by mouth daily.    Historical Provider, MD  FLUoxetine (PROZAC) 20 MG capsule Take 1 capsule (20 mg total) by  mouth daily. 07/18/15   Audery Amel, MD  fluticasone (FLONASE) 50 MCG/ACT nasal spray Place 2 sprays into both nostrils daily.    Historical Provider, MD  gabapentin (NEURONTIN) 300 MG capsule Take 1 capsule (300 mg total) by mouth 2 (two) times daily. 12/29/14   Loleta Rose, MD  hydrochlorothiazide (HYDRODIURIL) 25 MG tablet Take 25 mg by mouth daily.    Historical Provider, MD  loratadine (CLARITIN) 10 MG tablet Take 10 mg by mouth daily.    Historical Provider, MD  metaxalone (SKELAXIN) 800 MG tablet Take 1 tablet (800 mg total) by mouth 3 (three) times daily. 01/19/15   Katha Hamming, MD  metoprolol succinate (TOPROL-XL) 50 MG  24 hr tablet Take 50 mg by mouth daily.    Historical Provider, MD  pantoprazole (PROTONIX) 40 MG tablet Take 1 tablet (40 mg total) by mouth daily. 12/29/14   Loleta Rose, MD  polyethylene glycol (MIRALAX / GLYCOLAX) packet Take 17 g by mouth daily. 12/03/15   Emily Filbert, MD  QUEtiapine (SEROQUEL) 100 MG tablet Take 1 tablet (100 mg total) by mouth at bedtime. Patient taking differently: Take 200 mg by mouth at bedtime.  07/18/15   Audery Amel, MD  venlafaxine XR (EFFEXOR-XR) 150 MG 24 hr capsule Take 1 capsule (150 mg total) by mouth daily with breakfast. 01/05/15   Shari Prows, MD    Allergies Review of patient's allergies indicates no known allergies.  Family History  Problem Relation Age of Onset  . CAD Other   . Diabetes Other     Social History Social History  Substance Use Topics  . Smoking status: Current Every Day Smoker    Packs/day: 1.00    Types: Cigarettes  . Smokeless tobacco: Never Used  . Alcohol use No    Review of Systems Constitutional: No fever/chills Eyes: No visual changes. ENT: No sore throat. Cardiovascular: Denies chest pain. Respiratory: Denies shortness of breath. Gastrointestinal:See history of present illness. Genitourinary: Negative for dysuria. Musculoskeletal: Negative for back pain. Skin: Negative for rash. Neurological: Negative for headaches, focal weakness or numbness.  10-point ROS otherwise negative.  ____________________________________________   PHYSICAL EXAM:  VITAL SIGNS: ED Triage Vitals  Enc Vitals Group     BP 12/08/15 0945 (!) 140/93     Pulse Rate 12/08/15 0945 (!) 123     Resp 12/08/15 0945 18     Temp 12/08/15 0945 97.6 F (36.4 C)     Temp Source 12/08/15 0945 Oral     SpO2 12/08/15 0945 94 %     Weight 12/08/15 0946 210 lb (95.3 kg)     Height 12/08/15 0946 5\' 7"  (1.702 m)     Head Circumference --      Peak Flow --      Pain Score 12/08/15 0946 10     Pain Loc --      Pain Edu? --       Excl. in [place]? --     Constitutional: Alert and oriented. Well appearing and in no acute distress. Eyes: Conjunctivae are normal. PERRL. EOMI. Head: Atraumatic. Nose: No congestion/rhinnorhea. Mouth/Throat: Mucous membranes are moist.  Oropharynx non-erythematous. Neck: No stridor.   Cardiovascular: Normal rate, regular rhythm. Grossly normal heart sounds.  Good peripheral circulation. Respiratory: Normal respiratory effort.  No retractions. Lungs CTAB. Gastrointestinal: Soft patient complains of extreme pain even to light touch. There is no rash present.. No distention. No abdominal bruits. No CVA tenderness. Genitourinary: Tinea cruris only. Musculoskeletal: No  lower extremity tenderness nor edema.  No joint effusions. Neurologic:  Normal speech and language. No gross focal neurologic deficits are appreciated. No gait instability. Skin:  Skin is warm, dry and intact. No rash noted. Psychiatric: Mood and affect are normal. Speech and behavior are normal.  ____________________________________________   LABS (all labs ordered are listed, but only abnormal results are displayed)  Labs Reviewed  COMPREHENSIVE METABOLIC PANEL - Abnormal; Notable for the following:       Result Value   Potassium 3.2 (*)    Chloride 97 (*)    CO2 33 (*)    Glucose, Bld 186 (*)    Calcium 8.7 (*)    All other components within normal limits  CBC WITH DIFFERENTIAL/PLATELET - Abnormal; Notable for the following:    RDW 16.6 (*)    Platelets 131 (*)    Neutro Abs 9.0 (*)    Lymphs Abs 0.4 (*)    All other components within normal limits  URINALYSIS COMPLETEWITH MICROSCOPIC ([facility] ONLY) - Abnormal; Notable for the following:    Color, Urine YELLOW (*)    APPearance CLEAR (*)    Glucose, UA 150 (*)    Ketones, ur TRACE (*)    Protein, ur 30 (*)    Squamous Epithelial / LPF 0-5 (*)    All other components within normal limits  LIPASE, BLOOD  TROPONIN I    ____________________________________________  EKG  EKG read and interpreted by me shows sinus tachycardia rate 120 normal axis no ST-T wave changes. Patient has had EKGs with rates up as high's previously. After Ativan and some hydrocodone his heart rate goes down to about 114. ____________________________________________  RADIOLOGY   ____________________________________________   PROCEDURES  Procedure(s) performed:  Procedures  Critical Care performed:   ____________________________________________   INITIAL IMPRESSION / ASSESSMENT AND PLAN / ED COURSE  Pertinent labs & imaging results that were available during my care of the patient were reviewed by me and considered in my medical decision making (see chart for details).    Clinical Course  Patient's lab work is essentially normal   ____________________________________________   FINAL CLINICAL IMPRESSION(S) / ED DIAGNOSES  Final diagnoses:  Generalized abdominal pain  Tinea cruris      NEW MEDICATIONS STARTED DURING THIS VISIT:  New Prescriptions   No medications on file     Note:  This document was prepared using Dragon voice recognition software and may include unintentional dictation errors.    Arnaldo Natal, MD 12/08/15 1311

## 2015-12-08 NOTE — Discharge Instructions (Addendum)
Diflucan pill that we gave you here in the ER showed over the last couple days really help the pain in her groin. Please be sure to follow up with your regular doctor. Please return for any further symptoms.

## 2015-12-08 NOTE — ED Notes (Signed)
Pt given meal tray.

## 2015-12-08 NOTE — ED Triage Notes (Signed)
Pt returns for abd pain and urinary incontinence going on for about three weeks.

## 2015-12-11 ENCOUNTER — Emergency Department: Payer: Medicaid Other

## 2015-12-11 ENCOUNTER — Emergency Department
Admission: EM | Admit: 2015-12-11 | Discharge: 2015-12-11 | Disposition: A | Discharge status: Home / Self Care | Payer: Medicaid Other | Attending: Emergency Medicine | Admitting: Emergency Medicine

## 2015-12-11 ENCOUNTER — Encounter: Payer: Self-pay | Admitting: Emergency Medicine

## 2015-12-11 DIAGNOSIS — F17210 Nicotine dependence, cigarettes, uncomplicated: Secondary | ICD-10-CM | POA: Insufficient documentation

## 2015-12-11 DIAGNOSIS — J441 Chronic obstructive pulmonary disease with (acute) exacerbation: Secondary | ICD-10-CM | POA: Insufficient documentation

## 2015-12-11 DIAGNOSIS — I10 Essential (primary) hypertension: Secondary | ICD-10-CM | POA: Insufficient documentation

## 2015-12-11 DIAGNOSIS — J45909 Unspecified asthma, uncomplicated: Secondary | ICD-10-CM | POA: Insufficient documentation

## 2015-12-11 DIAGNOSIS — R0602 Shortness of breath: Secondary | ICD-10-CM | POA: Diagnosis present

## 2015-12-11 DIAGNOSIS — Z79899 Other long term (current) drug therapy: Secondary | ICD-10-CM | POA: Diagnosis not present

## 2015-12-11 LAB — CBC
HEMATOCRIT: 42.6 % (ref 40.0–52.0)
Hemoglobin: 14.2 g/dL (ref 13.0–18.0)
MCH: 29.8 pg (ref 26.0–34.0)
MCHC: 33.3 g/dL (ref 32.0–36.0)
MCV: 89.3 fL (ref 80.0–100.0)
PLATELETS: 115 10*3/uL — AB (ref 150–440)
RBC: 4.77 MIL/uL (ref 4.40–5.90)
RDW: 17.5 % — AB (ref 11.5–14.5)
WBC: 8.6 10*3/uL (ref 3.8–10.6)

## 2015-12-11 LAB — BASIC METABOLIC PANEL
Anion gap: 6 (ref 5–15)
BUN: 14 mg/dL (ref 6–20)
CHLORIDE: 102 mmol/L (ref 101–111)
CO2: 34 mmol/L — ABNORMAL HIGH (ref 22–32)
CREATININE: 1.17 mg/dL (ref 0.61–1.24)
Calcium: 8.6 mg/dL — ABNORMAL LOW (ref 8.9–10.3)
Glucose, Bld: 253 mg/dL — ABNORMAL HIGH (ref 65–99)
POTASSIUM: 3.6 mmol/L (ref 3.5–5.1)
SODIUM: 142 mmol/L (ref 135–145)

## 2015-12-11 LAB — TROPONIN I: Troponin I: <0.03 ng/mL (ref <0.03)

## 2015-12-11 MED ORDER — ALBUTEROL SULFATE HFA 108 (90 BASE) MCG/ACT IN AERS: 2.0000 | INHALATION_SPRAY | Freq: Four times a day (QID) | RESPIRATORY_TRACT | 2 refills | Status: AC | PRN

## 2015-12-11 MED ORDER — METHYLPREDNISOLONE SODIUM SUCC 125 MG IJ SOLR
125.0000 mg | Freq: Once | INTRAMUSCULAR | Status: AC
  Administered 2015-12-11: 125 mg via INTRAVENOUS
  Filled 2015-12-11: qty 2

## 2015-12-11 MED ORDER — IPRATROPIUM-ALBUTEROL 0.5-2.5 (3) MG/3ML IN SOLN
3.0000 mL | Freq: Once | RESPIRATORY_TRACT | Status: AC
  Administered 2015-12-11: 3 mL via RESPIRATORY_TRACT
  Filled 2015-12-11: qty 3

## 2015-12-11 NOTE — ED Triage Notes (Addendum)
Pt presents to ED from home via [facility] with c/o shortness of breath, chest pain and groin pain. Pt reports groin pain x 3 weeks reports has appt with urologist on 10/23 but reports pain has been increased. Pt reports shortness of breath and chest pain began tonight about 19:30. Pt denies n/v/d or abdominal pain. Pt alert and oriented, speaking in complete sentences. Pt reports is on 2L of O2 PRN nasal cannula. Per EMS pt was c/o feeling shaky, nervous, and insomnia after taking Prednisone tonight, EMS reports initial call was d/t pt c/o groin pain and unable to control bladder. EMS reports initial O2 was 89%, pt placed on 4L nasal cannula by EMS. Pt is a current every day smoker.

## 2015-12-11 NOTE — Discharge Instructions (Signed)
Return to emergency department if he develops a fever, productive cough or uncontrolled wheezing at home. Please contact her primary physician concerning your evaluation for shortness of breath and continued outpatient evaluation for difficulty with urination and groin pain.

## 2015-12-11 NOTE — ED Notes (Signed)
MD at bedside. 

## 2015-12-11 NOTE — ED Provider Notes (Signed)
Time Seen: Approximately 2151*  I have reviewed the triage notes  Chief Complaint: Shortness of Breath   History of Present Illness: [name] is a 50 y.o. male *who arrives via EMS for shortness of breath. Patient has been having some outpatient evaluation of some groin pain now except for the last several weeks which is been evaluated here in emergency department on past occasions. He denies any change in that discomfort. Patient has a history of COPD he states he still smokes at least a pack of cigarettes per day. He has home oxygen at 2 L. Patient states he last used his inhaler this morning and he is getting low on his Proventil inhaler. He denies any fever or productive cough. He denies any nausea or vomiting. Per EMS he reportedly felt really shaky and nervous and had some insomnia after taking the prescribed prednisone that he received from the outpatient clinic recently.   Past Medical History:  Diagnosis Date  . Anxiety   . Asthma   . Bipolar disorder (HCC)   . Chronic pain   . Esophageal stricture   . GERD (gastroesophageal reflux disease)   . H/O tonic-clonic seizures   . Hypertension   . Polysubstance abuse     Patient Active Problem List   Diagnosis Date Noted  . Bipolar affective disorder, current episode hypomanic (HCC)   . Bipolar disorder (HCC) 07/17/2015  . Suicidal ideation 07/17/2015  . Pneumonia 02/05/2015  . Chest pain 01/31/2015  . Left sided chest pain 01/19/2015  . Adjustment disorder with depressed mood 01/15/2015  . Dyslipidemia 01/03/2015  . Tobacco use disorder 01/03/2015  . Hypertension 01/02/2015  . Gastric reflux 01/02/2015    Past Surgical History:  Procedure Laterality Date  . APPENDECTOMY     complicated by perforation and abscess  . ESOPHAGOGASTRODUODENOSCOPY (EGD) WITH ESOPHAGEAL DILATION    . HERNIA REPAIR  Nov 2016   ventral hernia repair at [facility] by Dr. Genene Churn  . PEG TUBE REMOVAL    . PEG W/TRACHEOSTOMY PLACEMENT    .  TRACHEOSTOMY CLOSURE      Past Surgical History:  Procedure Laterality Date  . APPENDECTOMY     complicated by perforation and abscess  . ESOPHAGOGASTRODUODENOSCOPY (EGD) WITH ESOPHAGEAL DILATION    . HERNIA REPAIR  Nov 2016   ventral hernia repair at [facility] by Dr. Genene Churn  . PEG TUBE REMOVAL    . PEG W/TRACHEOSTOMY PLACEMENT    . TRACHEOSTOMY CLOSURE      Current Outpatient Rx  . Order #: [id number] Class: Print  . Order #: [id number] Class: Historical Med  . Order #: [id number] Class: Print  . Order #: [id number] Class: Print  . Order #: [id number] Class: Print  . Order #: [id number] Class: Print  . Order #: [id number] Class: Print  . Order #: [id number] Class: Historical Med  . Order #: [id number] Class: Print  . Order #: [id number] Class: Historical Med  . Order #: [id number] Class: Print  . Order #: [id number] Class: Historical Med  . Order #: [id number] Class: Historical Med  . Order #: [id number] Class: Normal  . Order #: [id number] Class: Historical Med  . Order #: [id number] Class: Print  . Order #: [id number] Class: Print  . Order #: [id number] Class: Print  . Order #: [id number] Class: Print    Allergies:  Review of patient's allergies indicates no known allergies.  Family History: Family History  Problem Relation Age of Onset  . CAD Other   . Diabetes Other     Social History: Social History  Substance Use Topics  . Smoking  status: Current Every Day Smoker    Packs/day: 1.00    Types: Cigarettes  . Smokeless tobacco: Never Used  . Alcohol use No     Review of Systems:   10 point review of systems was performed and was otherwise negative:  Constitutional: No fever Eyes: No visual disturbances ENT: No sore throat, ear pain Cardiac: No chest pain Respiratory:Mild increase in shortness of breath with the no audible wheezing at home. No stridor Abdomen: No abdominal pain, no vomiting, No diarrhea Endocrine: No weight loss, No night sweats Extremities: No peripheral edema,  cyanosis Skin: No rashes, easy bruising Neurologic: No focal weakness, trouble with speech or swollowing Urologic: No dysuria, Hematuria, or urinary frequency   Physical Exam:  ED Triage Vitals  Enc Vitals Group     BP 12/11/15 2146 (!) 155/87     Pulse Rate 12/11/15 2146 (!) 107     Resp 12/11/15 2146 18     Temp 12/11/15 2146 98.4 F (36.9 C)     Temp src --      SpO2 12/11/15 2137 (!) 89 %     Weight 12/11/15 2147 210 lb (95.3 kg)     Height 12/11/15 2147 5\' 7"  (1.702 m)     Head Circumference --      Peak Flow --      Pain Score 12/11/15 2147 10     Pain Loc --      Pain Edu? --      Excl. in GC? --     General: Awake , Alert , and Oriented times 3; GCS 15 Head: Normal cephalic , atraumatic Eyes: Pupils equal , round, reactive to light Nose/Throat: No nasal drainage, patent upper airway without erythema or exudate.  Neck: Supple, Full range of motion, No anterior adenopathy or palpable thyroid masses Lungs:Patient has some wheezing primarily heard at the apices with no rales or rhonchi noted  Heart: Regular rate, regular rhythm without murmurs , gallops , or rubs Abdomen: Soft, non tender without rebound, guarding , or rigidity; bowel sounds positive and symmetric in all 4 quadrants. No organomegaly .        Extremities: 2 plus symmetric pulses. No edema, clubbing or cyanosis Neurologic: normal ambulation, Motor symmetric without deficits, sensory intact Skin: warm, dry, no rashes   Labs:   All laboratory work was reviewed including any pertinent negatives or positives listed below:  Labs Reviewed  BASIC METABOLIC PANEL - Abnormal; Notable for the following:       Result Value   CO2 34 (*)    Glucose, Bld 253 (*)    Calcium 8.6 (*)    All other components within normal limits  CBC - Abnormal; Notable for the following:    RDW 17.5 (*)    Platelets 115 (*)    All other components within normal limits  TROPONIN I  Laboratory work was reviewed and showed no  clinically significant abnormalities.   EKG:  ED ECG REPORT I, Jennye Moccasin, the attending physician, personally viewed and interpreted this ECG.  Date: 12/11/2015 EKG Time: 2141 Rate: 110 Rhythm: normal sinus rhythm QRS Axis: normal Intervals: Right bundle-branch block  Left anterior fascicular block ST/T Wave abnormalities: normal Conduction Disturbances: none Narrative Interpretation: unremarkable No acute ischemic changes   Radiology:   "Dg Chest 2 View  Result Date: 12/11/2015 CLINICAL DATA:  Acute onset of shortness of breath and subacute onset of groin pain. Initial encounter. EXAM: CHEST  2 VIEW COMPARISON:  Chest  radiograph performed 12/04/2015 FINDINGS: The lungs are well-aerated. Peribronchial thickening is noted. Mild vascular congestion is seen. There is no evidence of pleural effusion or pneumothorax. Chronically increased interstitial markings are again noted The heart is borderline normal in size. No acute osseous abnormalities are seen. IMPRESSION: Peribronchial thickening noted. Mild vascular congestion seen. Chronically increased interstitial markings again noted. Electronically Signed   By: Roanna RaiderJeffery  Chang M.D.   On: 12/11/2015 22:23   Dg Chest 2 View  Result Date: 12/04/2015 CLINICAL DATA:  Chest pain and shortness of breath EXAM: CHEST  2 VIEW COMPARISON:  December 03, 2015 FINDINGS: Diffuse reticulonodular interstitial disease remains without change. Nodular opacity in the right mid lung measuring approximately 8 mm is stable. No new opacity. No airspace consolidation in particular. Heart size and pulmonary vascularity normal. No adenopathy. There is degenerative change in the thoracic spine. IMPRESSION: Diffuse reticulonodular interstitial disease. No airspace consolidation. No appreciable change compared to 1 day prior. Electronically Signed   By: Bretta BangWilliam  Woodruff III M.D.   On: 12/04/2015 09:27   Ct Abdomen Pelvis W Contrast  Result Date:  11/19/2015 CLINICAL DATA:  Acute onset of generalized abdominal pain and diarrhea. Initial encounter. EXAM: CT ABDOMEN AND PELVIS WITH CONTRAST TECHNIQUE: Multidetector CT imaging of the abdomen and pelvis was performed using the standard protocol following bolus administration of intravenous contrast. CONTRAST:  100mL ISOVUE-300 IOPAMIDOL (ISOVUE-300) INJECTION 61% COMPARISON:  CT of the abdomen and pelvis from 01/30/2015 FINDINGS: The visualized lung bases are clear. The liver and spleen are unremarkable in appearance. The gallbladder is within normal limits. A few tiny calcifications within the pancreas may reflect sequelae of chronic pancreatitis. The adrenal glands are unremarkable in appearance. The kidneys are unremarkable in appearance. There is no evidence of hydronephrosis. No renal or ureteral stones are seen. No perinephric stranding is appreciated. No free fluid is identified. The small bowel is unremarkable in appearance. The stomach is within normal limits. No acute vascular abnormalities are seen. Scattered calcification is seen along the abdominal aorta and its branches. The patient is status post appendectomy. Diverticulosis is noted along the mid sigmoid colon, without evidence of diverticulitis. The bladder is moderately distended and grossly unremarkable. The prostate is normal in size, with minimal calcification. No inguinal lymphadenopathy is seen. No acute osseous abnormalities are identified. A nonspecific sclerotic focus within the right ilium is stable in appearance and likely benign. IMPRESSION: 1. No acute abnormality seen within the abdomen or pelvis. 2. Scattered calcification along the abdominal aorta and its branches. 3. Diverticulosis along the mid sigmoid colon, without evidence of diverticulitis. 4. Few tiny calcifications within the pancreas may reflect sequelae of chronic pancreatitis. Electronically Signed   By: Roanna RaiderJeffery  Chang M.D.   On: 11/19/2015 21:41   Dg Chest Portable 1  View  Result Date: 11/19/2015 CLINICAL DATA:  50 year old male with chest pain. Bright red blood in stool for 3 days. Initial encounter. EXAM: PORTABLE CHEST 1 VIEW COMPARISON:  11/10/2015 and earlier. FINDINGS: Portable AP upright view at 1815 hours. Chronic calcified mediastinal lymph nodes. Stable cardiac size and mediastinal contours. Visualized tracheal air column is within normal limits. Chronic increased interstitial markings. No pneumothorax, pulmonary edema, pleural effusion or acute pulmonary opacity identified. No acute osseous abnormality identified. IMPRESSION: Stable pulmonary interstitial changes. No acute cardiopulmonary abnormality. Electronically Signed   By: Odessa FlemingH  Hall M.D.   On: 11/19/2015 19:01   Dg Abdomen Acute W/chest  Result Date: 12/03/2015 CLINICAL DATA:  Shortness of breath and abdominal pain.  Chest pain. EXAM: DG ABDOMEN ACUTE W/ 1V CHEST COMPARISON:  Chest radiograph November 19, 2015; CT abdomen and pelvis November 19, 2015 ; chest radiograph February 02, 2015 ; abdominal radiographs January 01, 2015 FINDINGS: PA chest: There is persistent reticulonodular interstitial lung disease. There is a stable 8 mm nodular opacity in the right mid lung. No new opacity. No consolidation. Heart size and pulmonary vascularity are normal. There are several calcified lymph nodes without adenopathy evident. Supine and upright abdomen: There is diffuse stool throughout the colon. There is no bowel dilatation or air-fluid levels to suggest obstruction. No free air. There is a sizable bone island in the right iliac crest, stable. IMPRESSION: Diffuse stool throughout colon. No bowel obstruction or free air. Diffuse reticulonodular interstitial lung disease, stable. No edema or consolidation. Electronically Signed   By: Bretta Bang III M.D.   On: 12/03/2015 19:40  "   I personally reviewed the radiologic studies Previous studies were reviewed    ED Course:  Patient's stay here was  uneventful and he felt symptomatically improved after breathing treatment. He states he is low on his Proventil and he'll be prescribed Proventil and is advised continue with his prednisone taper. Always tachycardic and actually his heart rates relatively slow compared to previous evaluations.  Clinical Course     Assessment: Acute exacerbation of chronic obstructive pulmonary disease Tobacco abuse Anxiety Final Clinical Impression:  Final diagnoses:  COPD exacerbation (HCC)     Plan:  Outpatient Patient was advised to return immediately if condition worsens. Patient was advised to follow up with their primary care physician or other specialized physicians involved in their outpatient care. The patient and/or family member/power of attorney had laboratory results reviewed at the bedside. All questions and concerns were addressed and appropriate discharge instructions were distributed by the nursing staff.             Jennye Moccasin, MD 12/11/15 [phone]

## 2015-12-11 NOTE — ED Notes (Signed)
Patient transported to X-ray via stretcher 

## 2015-12-13 ENCOUNTER — Emergency Department: Payer: Medicaid Other

## 2015-12-13 ENCOUNTER — Inpatient Hospital Stay
Admission: EM | Admit: 2015-12-13 | Discharge: 2015-12-14 | DRG: 190 | Disposition: A | Discharge status: Home / Self Care | Payer: Medicaid Other | Attending: Internal Medicine | Admitting: Internal Medicine

## 2015-12-13 ENCOUNTER — Encounter: Payer: Self-pay | Admitting: Emergency Medicine

## 2015-12-13 DIAGNOSIS — K219 Gastro-esophageal reflux disease without esophagitis: Secondary | ICD-10-CM | POA: Diagnosis present

## 2015-12-13 DIAGNOSIS — Z9049 Acquired absence of other specified parts of digestive tract: Secondary | ICD-10-CM | POA: Diagnosis not present

## 2015-12-13 DIAGNOSIS — Z8249 Family history of ischemic heart disease and other diseases of the circulatory system: Secondary | ICD-10-CM | POA: Diagnosis not present

## 2015-12-13 DIAGNOSIS — Z79899 Other long term (current) drug therapy: Secondary | ICD-10-CM

## 2015-12-13 DIAGNOSIS — N179 Acute kidney failure, unspecified: Secondary | ICD-10-CM | POA: Diagnosis present

## 2015-12-13 DIAGNOSIS — Z9981 Dependence on supplemental oxygen: Secondary | ICD-10-CM | POA: Diagnosis not present

## 2015-12-13 DIAGNOSIS — F419 Anxiety disorder, unspecified: Secondary | ICD-10-CM | POA: Diagnosis present

## 2015-12-13 DIAGNOSIS — F319 Bipolar disorder, unspecified: Secondary | ICD-10-CM | POA: Diagnosis present

## 2015-12-13 DIAGNOSIS — G8929 Other chronic pain: Secondary | ICD-10-CM | POA: Diagnosis present

## 2015-12-13 DIAGNOSIS — Z833 Family history of diabetes mellitus: Secondary | ICD-10-CM | POA: Diagnosis not present

## 2015-12-13 DIAGNOSIS — S301XXA Contusion of abdominal wall, initial encounter: Secondary | ICD-10-CM

## 2015-12-13 DIAGNOSIS — I10 Essential (primary) hypertension: Secondary | ICD-10-CM | POA: Diagnosis present

## 2015-12-13 DIAGNOSIS — T1490XA Injury, unspecified, initial encounter: Secondary | ICD-10-CM

## 2015-12-13 DIAGNOSIS — Z6834 Body mass index (BMI) 34.0-34.9, adult: Secondary | ICD-10-CM

## 2015-12-13 DIAGNOSIS — F17210 Nicotine dependence, cigarettes, uncomplicated: Secondary | ICD-10-CM | POA: Diagnosis present

## 2015-12-13 DIAGNOSIS — Z7951 Long term (current) use of inhaled steroids: Secondary | ICD-10-CM | POA: Diagnosis not present

## 2015-12-13 DIAGNOSIS — J9601 Acute respiratory failure with hypoxia: Secondary | ICD-10-CM | POA: Diagnosis present

## 2015-12-13 DIAGNOSIS — J441 Chronic obstructive pulmonary disease with (acute) exacerbation: Secondary | ICD-10-CM | POA: Diagnosis not present

## 2015-12-13 DIAGNOSIS — R0602 Shortness of breath: Secondary | ICD-10-CM | POA: Diagnosis not present

## 2015-12-13 DIAGNOSIS — M6281 Muscle weakness (generalized): Secondary | ICD-10-CM

## 2015-12-13 DIAGNOSIS — R2681 Unsteadiness on feet: Secondary | ICD-10-CM

## 2015-12-13 LAB — TROPONIN I

## 2015-12-13 LAB — CBC WITH DIFFERENTIAL/PLATELET
Basophils Absolute: 0 10*3/uL (ref 0–0.1)
Basophils Relative: 0 %
EOS ABS: 0 10*3/uL (ref 0–0.7)
EOS PCT: 0 %
HCT: 45.3 % (ref 40.0–52.0)
Hemoglobin: 15.1 g/dL (ref 13.0–18.0)
LYMPHS PCT: 11 %
Lymphs Abs: 1.2 10*3/uL (ref 1.0–3.6)
MCH: 29.8 pg (ref 26.0–34.0)
MCHC: 33.3 g/dL (ref 32.0–36.0)
MCV: 89.3 fL (ref 80.0–100.0)
MONO ABS: 1.1 10*3/uL — AB (ref 0.2–1.0)
Monocytes Relative: 10 %
Neutro Abs: 8.3 10*3/uL — ABNORMAL HIGH (ref 1.4–6.5)
Neutrophils Relative %: 79 %
PLATELETS: 154 10*3/uL (ref 150–440)
RBC: 5.07 MIL/uL (ref 4.40–5.90)
RDW: 17.8 % — AB (ref 11.5–14.5)
WBC: 10.7 10*3/uL — AB (ref 3.8–10.6)

## 2015-12-13 LAB — GLUCOSE, CAPILLARY: Glucose-Capillary: 235 mg/dL — ABNORMAL HIGH (ref 65–99)

## 2015-12-13 LAB — CREATININE, SERUM
Creatinine, Ser: 1.28 mg/dL — ABNORMAL HIGH (ref 0.61–1.24)
GFR calc non Af Amer: >60 mL/min (ref >60)

## 2015-12-13 LAB — COMPREHENSIVE METABOLIC PANEL
ALT: 37 U/L (ref 17–63)
AST: 34 U/L (ref 15–41)
Albumin: 3.6 g/dL (ref 3.5–5.0)
Alkaline Phosphatase: 58 U/L (ref 38–126)
Anion gap: 4 — ABNORMAL LOW (ref 5–15)
BUN: 13 mg/dL (ref 6–20)
CALCIUM: 8.7 mg/dL — AB (ref 8.9–10.3)
CO2: 40 mmol/L — ABNORMAL HIGH (ref 22–32)
CREATININE: 1.32 mg/dL — AB (ref 0.61–1.24)
Chloride: 100 mmol/L — ABNORMAL LOW (ref 101–111)
GFR calc Af Amer: >60 mL/min (ref >60)
Glucose, Bld: 117 mg/dL — ABNORMAL HIGH (ref 65–99)
Potassium: 4.2 mmol/L (ref 3.5–5.1)
SODIUM: 144 mmol/L (ref 135–145)
Total Bilirubin: 0.7 mg/dL (ref 0.3–1.2)
Total Protein: 6.6 g/dL (ref 6.5–8.1)

## 2015-12-13 LAB — MAGNESIUM: Magnesium: 2 mg/dL (ref 1.7–2.4)

## 2015-12-13 LAB — LIPASE, BLOOD: LIPASE: 32 U/L (ref 11–51)

## 2015-12-13 MED ORDER — FLUOXETINE HCL 20 MG PO CAPS
20.0000 mg | ORAL_CAPSULE | Freq: Every day | ORAL | Status: DC
  Administered 2015-12-14: 20 mg via ORAL
  Filled 2015-12-13: qty 1

## 2015-12-13 MED ORDER — LEVALBUTEROL HCL 1.25 MG/0.5ML IN NEBU
1.2500 mg | INHALATION_SOLUTION | Freq: Two times a day (BID) | RESPIRATORY_TRACT | Status: DC
  Administered 2015-12-14: 1.25 mg via RESPIRATORY_TRACT
  Filled 2015-12-13: qty 0.5

## 2015-12-13 MED ORDER — IPRATROPIUM-ALBUTEROL 0.5-2.5 (3) MG/3ML IN SOLN
3.0000 mL | Freq: Once | RESPIRATORY_TRACT | Status: AC
  Administered 2015-12-13: 3 mL via RESPIRATORY_TRACT
  Filled 2015-12-13: qty 3

## 2015-12-13 MED ORDER — GABAPENTIN 300 MG PO CAPS
300.0000 mg | ORAL_CAPSULE | Freq: Two times a day (BID) | ORAL | Status: DC
  Administered 2015-12-13 – 2015-12-14 (×2): 300 mg via ORAL
  Filled 2015-12-13 (×2): qty 1

## 2015-12-13 MED ORDER — LEVALBUTEROL HCL 1.25 MG/0.5ML IN NEBU
1.2500 mg | INHALATION_SOLUTION | Freq: Four times a day (QID) | RESPIRATORY_TRACT | Status: DC
  Administered 2015-12-13: 1.25 mg via RESPIRATORY_TRACT
  Filled 2015-12-13: qty 0.5

## 2015-12-13 MED ORDER — HYDRALAZINE HCL 20 MG/ML IJ SOLN: 10.0000 mg | Freq: Four times a day (QID) | INTRAMUSCULAR | Status: DC | PRN

## 2015-12-13 MED ORDER — FENOFIBRATE 160 MG PO TABS
160.0000 mg | ORAL_TABLET | Freq: Every day | ORAL | Status: DC
  Administered 2015-12-14: 160 mg via ORAL
  Filled 2015-12-13: qty 1

## 2015-12-13 MED ORDER — VENLAFAXINE HCL ER 75 MG PO CP24
150.0000 mg | ORAL_CAPSULE | Freq: Every day | ORAL | Status: DC
  Administered 2015-12-14: 150 mg via ORAL
  Filled 2015-12-13: qty 2

## 2015-12-13 MED ORDER — ACETAMINOPHEN 650 MG RE SUPP
650.0000 mg | Freq: Four times a day (QID) | RECTAL | Status: DC | PRN
Start: 2015-12-13 — End: 2015-12-14

## 2015-12-13 MED ORDER — ALBUTEROL SULFATE (2.5 MG/3ML) 0.083% IN NEBU: 2.5000 mg | INHALATION_SOLUTION | RESPIRATORY_TRACT | Status: DC | PRN

## 2015-12-13 MED ORDER — FLUTICASONE PROPIONATE 50 MCG/ACT NA SUSP
2.0000 | Freq: Every day | NASAL | Status: DC
  Administered 2015-12-14: 2 via NASAL
  Filled 2015-12-13: qty 16

## 2015-12-13 MED ORDER — OXYCODONE HCL 5 MG PO TABS
5.0000 mg | ORAL_TABLET | Freq: Once | ORAL | Status: AC
  Administered 2015-12-13: 5 mg via ORAL
  Filled 2015-12-13: qty 1

## 2015-12-13 MED ORDER — ONDANSETRON HCL 4 MG PO TABS: 4.0000 mg | ORAL_TABLET | Freq: Four times a day (QID) | ORAL | Status: DC | PRN

## 2015-12-13 MED ORDER — SODIUM CHLORIDE 0.9% FLUSH
3.0000 mL | Freq: Two times a day (BID) | INTRAVENOUS | Status: DC
  Administered 2015-12-13 – 2015-12-14 (×2): 3 mL via INTRAVENOUS

## 2015-12-13 MED ORDER — METHYLPREDNISOLONE SODIUM SUCC 125 MG IJ SOLR
125.0000 mg | Freq: Once | INTRAMUSCULAR | Status: AC
  Administered 2015-12-13: 125 mg via INTRAVENOUS
  Filled 2015-12-13: qty 2

## 2015-12-13 MED ORDER — SODIUM CHLORIDE 0.9% FLUSH: 3.0000 mL | INTRAVENOUS | Status: DC | PRN

## 2015-12-13 MED ORDER — HYDROCHLOROTHIAZIDE 25 MG PO TABS
25.0000 mg | ORAL_TABLET | Freq: Every day | ORAL | Status: DC
  Administered 2015-12-14: 25 mg via ORAL
  Filled 2015-12-13: qty 1

## 2015-12-13 MED ORDER — METAXALONE 800 MG PO TABS
800.0000 mg | ORAL_TABLET | Freq: Three times a day (TID) | ORAL | Status: DC
  Administered 2015-12-13 – 2015-12-14 (×2): 800 mg via ORAL
  Filled 2015-12-13 (×2): qty 1

## 2015-12-13 MED ORDER — ACETAMINOPHEN 325 MG PO TABS
650.0000 mg | ORAL_TABLET | Freq: Four times a day (QID) | ORAL | Status: DC | PRN
Start: 2015-12-13 — End: 2015-12-14
  Administered 2015-12-14: 650 mg via ORAL
  Filled 2015-12-13: qty 2

## 2015-12-13 MED ORDER — GUAIFENESIN 100 MG/5ML PO SOLN
5.0000 mL | ORAL | Status: DC | PRN
  Administered 2015-12-13: 100 mg via ORAL
  Filled 2015-12-13: qty 10

## 2015-12-13 MED ORDER — ONDANSETRON HCL 4 MG/2ML IJ SOLN: 4.0000 mg | Freq: Four times a day (QID) | INTRAMUSCULAR | Status: DC | PRN

## 2015-12-13 MED ORDER — DILTIAZEM HCL 30 MG PO TABS
30.0000 mg | ORAL_TABLET | Freq: Four times a day (QID) | ORAL | Status: DC
  Administered 2015-12-13 – 2015-12-14 (×3): 30 mg via ORAL
  Filled 2015-12-13 (×7): qty 1

## 2015-12-13 MED ORDER — INFLUENZA VAC SPLIT QUAD 0.5 ML IM SUSY
0.5000 mL | PREFILLED_SYRINGE | INTRAMUSCULAR | Status: AC
  Administered 2015-12-14: 09:00:00 0.5 mL via INTRAMUSCULAR
  Filled 2015-12-13: qty 0.5

## 2015-12-13 MED ORDER — POLYETHYLENE GLYCOL 3350 17 G PO PACK
17.0000 g | PACK | Freq: Every day | ORAL | Status: DC
  Administered 2015-12-13 – 2015-12-14 (×2): 17 g via ORAL
  Filled 2015-12-13 (×2): qty 1

## 2015-12-13 MED ORDER — CLONAZEPAM 0.5 MG PO TABS
0.5000 mg | ORAL_TABLET | Freq: Three times a day (TID) | ORAL | Status: DC
  Administered 2015-12-13 – 2015-12-14 (×2): 0.5 mg via ORAL
  Filled 2015-12-13 (×2): qty 1

## 2015-12-13 MED ORDER — METOPROLOL SUCCINATE ER 50 MG PO TB24
50.0000 mg | ORAL_TABLET | Freq: Every day | ORAL | Status: DC
  Administered 2015-12-14: 09:00:00 50 mg via ORAL
  Filled 2015-12-13: qty 1

## 2015-12-13 MED ORDER — SODIUM CHLORIDE 0.9 % IV SOLN: 250.0000 mL | INTRAVENOUS | Status: DC | PRN

## 2015-12-13 MED ORDER — METHYLPREDNISOLONE SODIUM SUCC 125 MG IJ SOLR
60.0000 mg | Freq: Four times a day (QID) | INTRAMUSCULAR | Status: DC
  Administered 2015-12-14 (×2): 60 mg via INTRAVENOUS
  Filled 2015-12-13 (×2): qty 2

## 2015-12-13 MED ORDER — ACETAMINOPHEN 500 MG PO TABS
1000.0000 mg | ORAL_TABLET | Freq: Once | ORAL | Status: AC
  Administered 2015-12-13: 1000 mg via ORAL
  Filled 2015-12-13: qty 2

## 2015-12-13 MED ORDER — IOPAMIDOL (ISOVUE-300) INJECTION 61%
100.0000 mL | Freq: Once | INTRAVENOUS | Status: AC | PRN
  Administered 2015-12-13: 100 mL via INTRAVENOUS

## 2015-12-13 MED ORDER — QUETIAPINE FUMARATE 200 MG PO TABS
200.0000 mg | ORAL_TABLET | Freq: Every day | ORAL | Status: DC
  Administered 2015-12-13: 200 mg via ORAL
  Filled 2015-12-13: qty 1

## 2015-12-13 MED ORDER — ALBUTEROL SULFATE (2.5 MG/3ML) 0.083% IN NEBU
10.0000 mg | INHALATION_SOLUTION | Freq: Once | RESPIRATORY_TRACT | Status: AC
Start: 2015-12-13 — End: 2015-12-13
  Administered 2015-12-13: 10 mg via RESPIRATORY_TRACT
  Filled 2015-12-13: qty 12

## 2015-12-13 MED ORDER — BUSPIRONE HCL 5 MG PO TABS
15.0000 mg | ORAL_TABLET | Freq: Three times a day (TID) | ORAL | Status: DC
Start: 2015-12-13 — End: 2015-12-14
  Administered 2015-12-13 – 2015-12-14 (×2): 15 mg via ORAL
  Filled 2015-12-13 (×2): qty 3

## 2015-12-13 MED ORDER — LORATADINE 10 MG PO TABS
10.0000 mg | ORAL_TABLET | Freq: Every day | ORAL | Status: DC
  Administered 2015-12-14: 09:00:00 10 mg via ORAL
  Filled 2015-12-13: qty 1

## 2015-12-13 MED ORDER — ENOXAPARIN SODIUM 40 MG/0.4ML ~~LOC~~ SOLN
40.0000 mg | SUBCUTANEOUS | Status: DC
  Administered 2015-12-13: 22:00:00 40 mg via SUBCUTANEOUS
  Filled 2015-12-13: qty 0.4

## 2015-12-13 MED ORDER — PANTOPRAZOLE SODIUM 40 MG PO TBEC
40.0000 mg | DELAYED_RELEASE_TABLET | Freq: Every day | ORAL | Status: DC
  Administered 2015-12-13 – 2015-12-14 (×2): 40 mg via ORAL
  Filled 2015-12-13 (×2): qty 1

## 2015-12-13 MED ORDER — NICOTINE 14 MG/24HR TD PT24
14.0000 mg | MEDICATED_PATCH | Freq: Every day | TRANSDERMAL | Status: DC
  Administered 2015-12-13 – 2015-12-14 (×2): 14 mg via TRANSDERMAL
  Filled 2015-12-13 (×2): qty 1

## 2015-12-13 NOTE — ED Notes (Signed)
Gave report to Dedra on 1C and states BP is too high to transfer. Paging DR. Chen to see if a PRN for BP can be ordered prior to transfer.

## 2015-12-13 NOTE — ED Notes (Signed)
Spoke with Dr. Imogene Burnhen. He is placing a PRN for Hypertension.
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
## 2015-12-14 LAB — CBC
HCT: 42.7 % (ref 40.0–52.0)
Hemoglobin: 14.1 g/dL (ref 13.0–18.0)
MCH: 29.6 pg (ref 26.0–34.0)
MCHC: 33.1 g/dL (ref 32.0–36.0)
MCV: 89.5 fL (ref 80.0–100.0)
PLATELETS: 115 10*3/uL — AB (ref 150–440)
RBC: 4.77 MIL/uL (ref 4.40–5.90)
RDW: 17.3 % — ABNORMAL HIGH (ref 11.5–14.5)
WBC: 7.3 10*3/uL (ref 3.8–10.6)

## 2015-12-14 LAB — BASIC METABOLIC PANEL
Anion gap: 9 (ref 5–15)
BUN: 20 mg/dL (ref 6–20)
CALCIUM: 8.7 mg/dL — AB (ref 8.9–10.3)
CO2: 35 mmol/L — ABNORMAL HIGH (ref 22–32)
CREATININE: 1.12 mg/dL (ref 0.61–1.24)
Chloride: 97 mmol/L — ABNORMAL LOW (ref 101–111)
Glucose, Bld: 242 mg/dL — ABNORMAL HIGH (ref 65–99)
Potassium: 3.9 mmol/L (ref 3.5–5.1)
Sodium: 141 mmol/L (ref 135–145)

## 2015-12-14 MED ORDER — HYDROCODONE-ACETAMINOPHEN 5-325 MG PO TABS
1.0000 | ORAL_TABLET | Freq: Four times a day (QID) | ORAL | Status: DC | PRN
  Administered 2015-12-14: 09:00:00 1 via ORAL
  Filled 2015-12-14: qty 1

## 2015-12-14 MED ORDER — PREDNISONE 10 MG PO TABS
50.0000 mg | ORAL_TABLET | Freq: Every day | ORAL | 0 refills | Status: AC
Start: 2015-12-15 — End: ?

## 2015-12-14 MED ORDER — PREDNISONE 20 MG PO TABS: 50.0000 mg | ORAL_TABLET | Freq: Every day | ORAL | Status: DC

## 2015-12-14 MED ORDER — ASPIRIN EC 81 MG PO TBEC: DELAYED_RELEASE_TABLET | ORAL | 0 refills | Status: AC

## 2015-12-14 MED ORDER — ALBUTEROL SULFATE (2.5 MG/3ML) 0.083% IN NEBU: 2.5000 mg | INHALATION_SOLUTION | RESPIRATORY_TRACT | 12 refills | Status: AC | PRN

## 2015-12-14 MED ORDER — MORPHINE SULFATE (PF) 2 MG/ML IV SOLN: 1.0000 mg | INTRAVENOUS | Status: DC | PRN

## 2015-12-14 MED ORDER — NICOTINE 14 MG/24HR TD PT24: 14.0000 mg | MEDICATED_PATCH | Freq: Every day | TRANSDERMAL | 0 refills | Status: AC

## 2015-12-14 MED ORDER — HYDROCODONE-ACETAMINOPHEN 5-325 MG PO TABS: 1.0000 | ORAL_TABLET | Freq: Four times a day (QID) | ORAL | Status: DC | PRN

## 2015-12-14 NOTE — Progress Notes (Signed)
Pt is being discharged home. Discharge instructions given and explained to pt. Pt verbalized understanding. Meds and f/u appointments reviewed with pt. Nebulizer given.
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
## 2015-12-14 NOTE — Progress Notes (Signed)
Inpatient Diabetes Program Recommendations  AACE/ADA: New Consensus Statement on Inpatient Glycemic Control (2015)  Target Ranges:  Prepandial:   less than 140 mg/dL      Peak postprandial:   less than 180 mg/dL (1-2 hours)      Critically ill patients:  140 - 180 mg/dL   Lab Results  Component Value Date   GLUCAP 235 (H) 12/13/2015   HGBA1C 6.0 01/31/2015    Review of Glycemic Control  Diabetes history: none Outpatient Diabetes medications: none Current orders for Inpatient glycemic control: none  Inpatient Diabetes Program Recommendations:  Please consider Sensitive Correction Scale ACHS while on steroids.  Thank you,  Kristine LineaKaren Garnett Rekowski, RN, BSN Diabetes Coordinator Inpatient Diabetes Program [phone] (Team Pager) [phone] ([facility] office) [phone] [facility] office) [phone] [facility] office)

## 2015-12-14 NOTE — Care Management (Signed)
Nebulizer for Home use will be arranged through [facility] prior to being discharged to home today. Gwenette GreetBrenda S Zaiyden Strozier RN MSN CCM Care Management [phone]

## 2015-12-14 NOTE — Discharge Instructions (Signed)
Use nebulizer per instruction Resume use of your oxygen Stop smoking!!
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
## 2015-12-14 NOTE — Progress Notes (Signed)
CSW provided patient with [facility] information. Per patient he is followed by [facility]. Thanked CSW for assistance.  Woodroe Modehristina Vance Belcourt, MSW, LCSW, LCAS-A Clinical Social Worker [phone]
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
## 2015-12-15 ENCOUNTER — Emergency Department: Payer: Medicaid Other

## 2015-12-15 ENCOUNTER — Encounter: Payer: Self-pay | Admitting: Emergency Medicine

## 2015-12-15 ENCOUNTER — Emergency Department
Admission: EM | Admit: 2015-12-15 | Discharge: 2015-12-16 | Disposition: A | Discharge status: Home / Self Care | Payer: Medicaid Other | Source: Home / Self Care | Attending: Emergency Medicine | Admitting: Emergency Medicine

## 2015-12-15 DIAGNOSIS — Z79899 Other long term (current) drug therapy: Secondary | ICD-10-CM | POA: Insufficient documentation

## 2015-12-15 DIAGNOSIS — I10 Essential (primary) hypertension: Secondary | ICD-10-CM | POA: Insufficient documentation

## 2015-12-15 DIAGNOSIS — J45909 Unspecified asthma, uncomplicated: Secondary | ICD-10-CM | POA: Insufficient documentation

## 2015-12-15 DIAGNOSIS — F1290 Cannabis use, unspecified, uncomplicated: Secondary | ICD-10-CM | POA: Insufficient documentation

## 2015-12-15 DIAGNOSIS — R079 Chest pain, unspecified: Secondary | ICD-10-CM | POA: Insufficient documentation

## 2015-12-15 DIAGNOSIS — F17210 Nicotine dependence, cigarettes, uncomplicated: Secondary | ICD-10-CM | POA: Insufficient documentation

## 2015-12-15 DIAGNOSIS — Z7982 Long term (current) use of aspirin: Secondary | ICD-10-CM

## 2015-12-15 LAB — BASIC METABOLIC PANEL
Anion gap: 7 (ref 5–15)
BUN: 21 mg/dL — ABNORMAL HIGH (ref 6–20)
CALCIUM: 8.9 mg/dL (ref 8.9–10.3)
CHLORIDE: 89 mmol/L — AB (ref 101–111)
CO2: 43 mmol/L — AB (ref 22–32)
Creatinine, Ser: 1.19 mg/dL (ref 0.61–1.24)
Glucose, Bld: 174 mg/dL — ABNORMAL HIGH (ref 65–99)
POTASSIUM: 3.5 mmol/L (ref 3.5–5.1)
SODIUM: 139 mmol/L (ref 135–145)

## 2015-12-15 LAB — CBC
HEMATOCRIT: 51 % (ref 40.0–52.0)
Hemoglobin: 16.4 g/dL (ref 13.0–18.0)
MCH: 29.2 pg (ref 26.0–34.0)
MCHC: 32.2 g/dL (ref 32.0–36.0)
MCV: 90.8 fL (ref 80.0–100.0)
Platelets: 129 10*3/uL — ABNORMAL LOW (ref 150–440)
RBC: 5.62 MIL/uL (ref 4.40–5.90)
RDW: 17.3 % — AB (ref 11.5–14.5)
WBC: 10.1 10*3/uL (ref 3.8–10.6)

## 2015-12-15 LAB — TROPONIN I

## 2015-12-15 NOTE — ED Triage Notes (Signed)
Per [facility], patient comes from home with c/o CP 3 hours PTA. Hx of TIA and COPD. Patient wears 2L of O2 all the time at home. Patient describes his pain a stabbing/sharp 10/10 central CP. EMS reports CO2 86. EMS gave 1 duoneb treatment, 125 solu-medrol, and 324 asa. Patient is A&O x4. RA SpO2 here 86%. Patient placed 3L [place]. Tolerating well, SpO2 96%.

## 2015-12-16 ENCOUNTER — Inpatient Hospital Stay
Admission: EM | Admit: 2015-12-16 | Discharge: 2015-12-18 | DRG: 605 | Disposition: A | Discharge status: Home / Self Care | Payer: Medicaid Other | Attending: Internal Medicine | Admitting: Internal Medicine

## 2015-12-16 DIAGNOSIS — I10 Essential (primary) hypertension: Secondary | ICD-10-CM | POA: Diagnosis present

## 2015-12-16 DIAGNOSIS — Z7982 Long term (current) use of aspirin: Secondary | ICD-10-CM

## 2015-12-16 DIAGNOSIS — Z79899 Other long term (current) drug therapy: Secondary | ICD-10-CM

## 2015-12-16 DIAGNOSIS — Z7951 Long term (current) use of inhaled steroids: Secondary | ICD-10-CM

## 2015-12-16 DIAGNOSIS — F17210 Nicotine dependence, cigarettes, uncomplicated: Secondary | ICD-10-CM | POA: Diagnosis present

## 2015-12-16 DIAGNOSIS — E782 Mixed hyperlipidemia: Secondary | ICD-10-CM | POA: Diagnosis present

## 2015-12-16 DIAGNOSIS — S301XXA Contusion of abdominal wall, initial encounter: Principal | ICD-10-CM | POA: Diagnosis present

## 2015-12-16 DIAGNOSIS — K219 Gastro-esophageal reflux disease without esophagitis: Secondary | ICD-10-CM | POA: Diagnosis present

## 2015-12-16 DIAGNOSIS — I214 Non-ST elevation (NSTEMI) myocardial infarction: Secondary | ICD-10-CM | POA: Diagnosis present

## 2015-12-16 DIAGNOSIS — Z7952 Long term (current) use of systemic steroids: Secondary | ICD-10-CM

## 2015-12-16 DIAGNOSIS — F319 Bipolar disorder, unspecified: Secondary | ICD-10-CM | POA: Diagnosis present

## 2015-12-16 DIAGNOSIS — R0781 Pleurodynia: Secondary | ICD-10-CM

## 2015-12-16 LAB — URINALYSIS COMPLETE WITH MICROSCOPIC (ARMC ONLY)
BACTERIA UA: NONE SEEN
Bilirubin Urine: NEGATIVE
GLUCOSE, UA: NEGATIVE mg/dL
KETONES UR: NEGATIVE mg/dL
LEUKOCYTES UA: NEGATIVE
NITRITE: NEGATIVE
PROTEIN: NEGATIVE mg/dL
RBC / HPF: NONE SEEN RBC/hpf (ref 0–5)
SPECIFIC GRAVITY, URINE: 1.01 (ref 1.005–1.030)
Squamous Epithelial / LPF: NONE SEEN
pH: 7 (ref 5.0–8.0)

## 2015-12-16 LAB — CBC
HCT: 48.4 % (ref 40.0–52.0)
Hemoglobin: 15.5 g/dL (ref 13.0–18.0)
MCH: 29.2 pg (ref 26.0–34.0)
MCHC: 32.1 g/dL (ref 32.0–36.0)
MCV: 90.8 fL (ref 80.0–100.0)
PLATELETS: 109 10*3/uL — AB (ref 150–440)
RBC: 5.33 MIL/uL (ref 4.40–5.90)
RDW: 17.2 % — ABNORMAL HIGH (ref 11.5–14.5)
WBC: 8.1 10*3/uL (ref 3.8–10.6)

## 2015-12-16 LAB — TROPONIN I: Troponin I: <0.03 ng/mL (ref <0.03)

## 2015-12-16 MED ORDER — ONDANSETRON HCL 4 MG/2ML IJ SOLN
4.0000 mg | Freq: Once | INTRAMUSCULAR | Status: AC
  Administered 2015-12-16: 4 mg via INTRAVENOUS
  Filled 2015-12-16: qty 2

## 2015-12-16 MED ORDER — MORPHINE SULFATE (PF) 4 MG/ML IV SOLN
4.0000 mg | Freq: Once | INTRAVENOUS | Status: AC
  Administered 2015-12-16: 4 mg via INTRAVENOUS
  Filled 2015-12-16: qty 1

## 2015-12-16 MED ORDER — ONDANSETRON HCL 4 MG/2ML IJ SOLN
4.0000 mg | Freq: Once | INTRAMUSCULAR | Status: AC
  Administered 2015-12-16: 4 mg via INTRAVENOUS

## 2015-12-16 MED ORDER — OXYCODONE-ACETAMINOPHEN 5-325 MG PO TABS
1.0000 | ORAL_TABLET | Freq: Once | ORAL | Status: AC
  Administered 2015-12-16: 1 via ORAL
  Filled 2015-12-16: qty 1

## 2015-12-16 MED ORDER — ONDANSETRON HCL 4 MG/2ML IJ SOLN
INTRAMUSCULAR | Status: AC
  Administered 2015-12-16: 4 mg via INTRAVENOUS
  Filled 2015-12-16: qty 2

## 2015-12-16 MED ORDER — MORPHINE SULFATE (PF) 4 MG/ML IV SOLN
4.0000 mg | Freq: Once | INTRAVENOUS | Status: AC
  Administered 2015-12-16: 4 mg via INTRAVENOUS

## 2015-12-16 MED ORDER — MORPHINE SULFATE (PF) 4 MG/ML IV SOLN
INTRAVENOUS | Status: AC
  Administered 2015-12-16: 4 mg via INTRAVENOUS
  Filled 2015-12-16: qty 1

## 2015-12-16 NOTE — ED Triage Notes (Signed)
Pt presents to ED via [facility] c/o chest pain. Per EMS pt reports left sided non-radiating chest pain; which EMS reports administering 4 aspirin. Pt reports SOB and nausea, describes chest pain as sharp with pressure. Pt drowsy with slurred speech; reports taking night time meds. NAD at this time.
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
## 2015-12-16 NOTE — Discharge Instructions (Signed)
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
## 2015-12-17 ENCOUNTER — Encounter: Payer: Self-pay | Admitting: Radiology

## 2015-12-17 ENCOUNTER — Inpatient Hospital Stay: Payer: Medicaid Other

## 2015-12-17 DIAGNOSIS — K219 Gastro-esophageal reflux disease without esophagitis: Secondary | ICD-10-CM | POA: Diagnosis present

## 2015-12-17 DIAGNOSIS — Z7982 Long term (current) use of aspirin: Secondary | ICD-10-CM | POA: Diagnosis not present

## 2015-12-17 DIAGNOSIS — I10 Essential (primary) hypertension: Secondary | ICD-10-CM | POA: Diagnosis present

## 2015-12-17 DIAGNOSIS — S301XXA Contusion of abdominal wall, initial encounter: Secondary | ICD-10-CM | POA: Diagnosis present

## 2015-12-17 DIAGNOSIS — E782 Mixed hyperlipidemia: Secondary | ICD-10-CM | POA: Diagnosis present

## 2015-12-17 DIAGNOSIS — Z7952 Long term (current) use of systemic steroids: Secondary | ICD-10-CM | POA: Diagnosis not present

## 2015-12-17 DIAGNOSIS — F319 Bipolar disorder, unspecified: Secondary | ICD-10-CM | POA: Diagnosis present

## 2015-12-17 DIAGNOSIS — Z7951 Long term (current) use of inhaled steroids: Secondary | ICD-10-CM | POA: Diagnosis not present

## 2015-12-17 DIAGNOSIS — I214 Non-ST elevation (NSTEMI) myocardial infarction: Secondary | ICD-10-CM | POA: Diagnosis present

## 2015-12-17 DIAGNOSIS — R079 Chest pain, unspecified: Secondary | ICD-10-CM | POA: Diagnosis present

## 2015-12-17 DIAGNOSIS — F17210 Nicotine dependence, cigarettes, uncomplicated: Secondary | ICD-10-CM | POA: Diagnosis present

## 2015-12-17 DIAGNOSIS — Z79899 Other long term (current) drug therapy: Secondary | ICD-10-CM | POA: Diagnosis not present

## 2015-12-17 LAB — CBC
HCT: 46.2 % (ref 40.0–52.0)
Hemoglobin: 15.1 g/dL (ref 13.0–18.0)
MCH: 29.4 pg (ref 26.0–34.0)
MCHC: 32.8 g/dL (ref 32.0–36.0)
MCV: 89.6 fL (ref 80.0–100.0)
PLATELETS: 106 10*3/uL — AB (ref 150–440)
RBC: 5.15 MIL/uL (ref 4.40–5.90)
RDW: 17.3 % — ABNORMAL HIGH (ref 11.5–14.5)
WBC: 5.7 10*3/uL (ref 3.8–10.6)

## 2015-12-17 LAB — LIPID PANEL
CHOL/HDL RATIO: 2.7 ratio
Cholesterol: 132 mg/dL (ref 0–200)
HDL: 49 mg/dL (ref >40)
LDL CALC: 54 mg/dL (ref 0–99)
TRIGLYCERIDES: 144 mg/dL (ref <150)
VLDL: 29 mg/dL (ref 0–40)

## 2015-12-17 LAB — PROTIME-INR
INR: 1.02
INR: 1.02
Prothrombin Time: 13.4 seconds (ref 11.4–15.2)
Prothrombin Time: 13.4 seconds (ref 11.4–15.2)

## 2015-12-17 LAB — BASIC METABOLIC PANEL
ANION GAP: 11 (ref 5–15)
Anion gap: 7 (ref 5–15)
BUN: 28 mg/dL — AB (ref 6–20)
BUN: 27 mg/dL — AB (ref 6–20)
CHLORIDE: 91 mmol/L — AB (ref 101–111)
CHLORIDE: 91 mmol/L — AB (ref 101–111)
CO2: 39 mmol/L — AB (ref 22–32)
CO2: 46 mmol/L — AB (ref 22–32)
CREATININE: 1.24 mg/dL (ref 0.61–1.24)
Calcium: 8.8 mg/dL — ABNORMAL LOW (ref 8.9–10.3)
Calcium: 8.5 mg/dL — ABNORMAL LOW (ref 8.9–10.3)
Creatinine, Ser: 1.16 mg/dL (ref 0.61–1.24)
GFR calc Af Amer: >60 mL/min (ref >60)
GFR calc Af Amer: >60 mL/min (ref >60)
GFR calc non Af Amer: >60 mL/min (ref >60)
GFR calc non Af Amer: >60 mL/min (ref >60)
GLUCOSE: 152 mg/dL — AB (ref 65–99)
Glucose, Bld: 93 mg/dL (ref 65–99)
POTASSIUM: 3.9 mmol/L (ref 3.5–5.1)
POTASSIUM: 3.5 mmol/L (ref 3.5–5.1)
Sodium: 141 mmol/L (ref 135–145)
Sodium: 144 mmol/L (ref 135–145)

## 2015-12-17 LAB — TROPONIN I
TROPONIN I: 0.07 ng/mL — AB (ref <0.03)
TROPONIN I: 0.07 ng/mL — AB (ref <0.03)
TROPONIN I: 0.05 ng/mL — AB (ref <0.03)
Troponin I: 0.08 ng/mL (ref <0.03)

## 2015-12-17 LAB — APTT

## 2015-12-17 LAB — HEPARIN LEVEL (UNFRACTIONATED)
HEPARIN UNFRACTIONATED: 0.31 [IU]/mL (ref 0.30–0.70)
HEPARIN UNFRACTIONATED: 0.23 [IU]/mL — AB (ref 0.30–0.70)

## 2015-12-17 LAB — TSH: TSH: 1.587 u[IU]/mL (ref 0.350–4.500)

## 2015-12-17 MED ORDER — DIFLUNISAL 500 MG PO TABS: 500.0000 mg | ORAL_TABLET | Freq: Two times a day (BID) | ORAL | Status: DC | PRN

## 2015-12-17 MED ORDER — ATORVASTATIN CALCIUM 10 MG PO TABS: 10.0000 mg | ORAL_TABLET | Freq: Every evening | ORAL | Status: DC

## 2015-12-17 MED ORDER — PREDNISONE 20 MG PO TABS
40.0000 mg | ORAL_TABLET | Freq: Every day | ORAL | Status: AC
  Administered 2015-12-17: 40 mg via ORAL
  Filled 2015-12-17: qty 2

## 2015-12-17 MED ORDER — IOPAMIDOL (ISOVUE-370) INJECTION 76%
75.0000 mL | Freq: Once | INTRAVENOUS | Status: AC | PRN
  Administered 2015-12-17: 75 mL via INTRAVENOUS

## 2015-12-17 MED ORDER — FENOFIBRATE 160 MG PO TABS
160.0000 mg | ORAL_TABLET | Freq: Every day | ORAL | Status: DC
  Administered 2015-12-17 – 2015-12-18 (×2): 160 mg via ORAL
  Filled 2015-12-17 (×2): qty 1

## 2015-12-17 MED ORDER — HEPARIN (PORCINE) IN NACL 100-0.45 UNIT/ML-% IJ SOLN
1000.0000 [IU]/h | INTRAMUSCULAR | Status: DC
  Administered 2015-12-17: 1000 [IU]/h via INTRAVENOUS
  Filled 2015-12-17: qty 250

## 2015-12-17 MED ORDER — ALBUTEROL SULFATE (2.5 MG/3ML) 0.083% IN NEBU
3.0000 mL | INHALATION_SOLUTION | Freq: Four times a day (QID) | RESPIRATORY_TRACT | Status: DC | PRN
  Administered 2015-12-17: 3 mL via RESPIRATORY_TRACT
  Filled 2015-12-17: qty 3

## 2015-12-17 MED ORDER — POLYETHYLENE GLYCOL 3350 17 G PO PACK
17.0000 g | PACK | Freq: Every day | ORAL | Status: DC
Start: 2015-12-17 — End: 2015-12-18
  Administered 2015-12-17 – 2015-12-18 (×2): 17 g via ORAL
  Filled 2015-12-17 (×2): qty 1

## 2015-12-17 MED ORDER — NICOTINE 14 MG/24HR TD PT24
14.0000 mg | MEDICATED_PATCH | Freq: Every day | TRANSDERMAL | Status: DC
  Administered 2015-12-17 – 2015-12-18 (×2): 14 mg via TRANSDERMAL
  Filled 2015-12-17 (×2): qty 1

## 2015-12-17 MED ORDER — PREDNISONE 10 MG PO TABS: 10.0000 mg | ORAL_TABLET | Freq: Every day | ORAL | Status: DC

## 2015-12-17 MED ORDER — PREDNISONE 50 MG PO TABS: 50.0000 mg | ORAL_TABLET | Freq: Every day | ORAL | Status: DC

## 2015-12-17 MED ORDER — ATORVASTATIN CALCIUM 20 MG PO TABS
40.0000 mg | ORAL_TABLET | Freq: Every evening | ORAL | Status: DC
  Administered 2015-12-17: 40 mg via ORAL
  Filled 2015-12-17: qty 2

## 2015-12-17 MED ORDER — CLOPIDOGREL BISULFATE 75 MG PO TABS
300.0000 mg | ORAL_TABLET | Freq: Once | ORAL | Status: AC
  Administered 2015-12-17: 300 mg via ORAL
  Filled 2015-12-17: qty 4

## 2015-12-17 MED ORDER — NITROGLYCERIN 2 % TD OINT
1.0000 [in_us] | TOPICAL_OINTMENT | Freq: Four times a day (QID) | TRANSDERMAL | Status: DC
  Administered 2015-12-17 – 2015-12-18 (×5): 1 [in_us] via TOPICAL
  Filled 2015-12-17 (×5): qty 1

## 2015-12-17 MED ORDER — ASPIRIN EC 81 MG PO TBEC
81.0000 mg | DELAYED_RELEASE_TABLET | Freq: Every day | ORAL | Status: DC
  Administered 2015-12-17 – 2015-12-18 (×2): 81 mg via ORAL
  Filled 2015-12-17 (×2): qty 1

## 2015-12-17 MED ORDER — QUETIAPINE FUMARATE 25 MG PO TABS
100.0000 mg | ORAL_TABLET | Freq: Every day | ORAL | Status: DC
  Administered 2015-12-17: 100 mg via ORAL
  Filled 2015-12-17: qty 4

## 2015-12-17 MED ORDER — HEPARIN BOLUS VIA INFUSION
4000.0000 [IU] | Freq: Once | INTRAVENOUS | Status: AC
  Administered 2015-12-17: 4000 [IU] via INTRAVENOUS
  Filled 2015-12-17: qty 4000

## 2015-12-17 MED ORDER — ONDANSETRON HCL 4 MG/2ML IJ SOLN: 4.0000 mg | Freq: Four times a day (QID) | INTRAMUSCULAR | Status: DC | PRN

## 2015-12-17 MED ORDER — PREDNISONE 20 MG PO TABS: 20.0000 mg | ORAL_TABLET | Freq: Every day | ORAL | Status: DC

## 2015-12-17 MED ORDER — PANTOPRAZOLE SODIUM 40 MG PO TBEC
40.0000 mg | DELAYED_RELEASE_TABLET | Freq: Every day | ORAL | Status: DC
  Administered 2015-12-17 – 2015-12-18 (×2): 40 mg via ORAL
  Filled 2015-12-17 (×2): qty 1

## 2015-12-17 MED ORDER — BUSPIRONE HCL 5 MG PO TABS
15.0000 mg | ORAL_TABLET | Freq: Three times a day (TID) | ORAL | Status: DC
  Administered 2015-12-17 – 2015-12-18 (×4): 15 mg via ORAL
  Filled 2015-12-17 (×4): qty 3

## 2015-12-17 MED ORDER — ACETAMINOPHEN 325 MG PO TABS: 650.0000 mg | ORAL_TABLET | ORAL | Status: DC | PRN

## 2015-12-17 MED ORDER — FLUOXETINE HCL 20 MG PO CAPS
20.0000 mg | ORAL_CAPSULE | Freq: Every day | ORAL | Status: DC
  Administered 2015-12-17 – 2015-12-18 (×2): 20 mg via ORAL
  Filled 2015-12-17 (×2): qty 1

## 2015-12-17 MED ORDER — ALBUTEROL SULFATE (2.5 MG/3ML) 0.083% IN NEBU: 2.5000 mg | INHALATION_SOLUTION | RESPIRATORY_TRACT | Status: DC | PRN

## 2015-12-17 MED ORDER — OXYCODONE-ACETAMINOPHEN 5-325 MG PO TABS
1.0000 | ORAL_TABLET | Freq: Four times a day (QID) | ORAL | Status: DC | PRN
Start: 2015-12-17 — End: 2015-12-18
  Administered 2015-12-17 – 2015-12-18 (×4): 1 via ORAL
  Filled 2015-12-17 (×4): qty 1

## 2015-12-17 MED ORDER — LORATADINE 10 MG PO TABS
10.0000 mg | ORAL_TABLET | Freq: Every day | ORAL | Status: DC
  Administered 2015-12-17 – 2015-12-18 (×2): 10 mg via ORAL
  Filled 2015-12-17 (×2): qty 1

## 2015-12-17 MED ORDER — PREDNISONE 20 MG PO TABS
30.0000 mg | ORAL_TABLET | Freq: Every day | ORAL | Status: AC
  Administered 2015-12-18: 30 mg via ORAL
  Filled 2015-12-17: qty 1

## 2015-12-17 MED ORDER — HYDROCHLOROTHIAZIDE 25 MG PO TABS
25.0000 mg | ORAL_TABLET | Freq: Every day | ORAL | Status: DC
  Administered 2015-12-17 – 2015-12-18 (×2): 25 mg via ORAL
  Filled 2015-12-17 (×2): qty 1

## 2015-12-17 MED ORDER — LURASIDONE HCL 40 MG PO TABS
60.0000 mg | ORAL_TABLET | Freq: Every evening | ORAL | Status: DC
  Administered 2015-12-17: 60 mg via ORAL
  Filled 2015-12-17: qty 2
  Filled 2015-12-17: qty 3

## 2015-12-17 MED ORDER — METOPROLOL SUCCINATE ER 50 MG PO TB24
50.0000 mg | ORAL_TABLET | Freq: Every day | ORAL | Status: DC
  Administered 2015-12-17 – 2015-12-18 (×2): 50 mg via ORAL
  Filled 2015-12-17 (×2): qty 1

## 2015-12-17 MED ORDER — FLUTICASONE PROPIONATE 50 MCG/ACT NA SUSP
2.0000 | Freq: Every day | NASAL | Status: DC
Start: 2015-12-17 — End: 2015-12-18
  Administered 2015-12-17 – 2015-12-18 (×2): 2 via NASAL
  Filled 2015-12-17: qty 16

## 2015-12-17 MED ORDER — TRIAMCINOLONE ACETONIDE 0.1 % EX CREA
1.0000 "application " | TOPICAL_CREAM | Freq: Two times a day (BID) | CUTANEOUS | Status: DC
  Administered 2015-12-17 – 2015-12-18 (×3): 1 via TOPICAL
  Filled 2015-12-17: qty 15

## 2015-12-17 MED ORDER — CLONAZEPAM 0.5 MG PO TABS
0.5000 mg | ORAL_TABLET | Freq: Three times a day (TID) | ORAL | Status: DC
  Administered 2015-12-17 – 2015-12-18 (×4): 0.5 mg via ORAL
  Filled 2015-12-17 (×4): qty 1

## 2015-12-17 MED ORDER — MORPHINE SULFATE (PF) 2 MG/ML IV SOLN
2.0000 mg | INTRAVENOUS | Status: DC | PRN
  Administered 2015-12-17 – 2015-12-18 (×5): 2 mg via INTRAVENOUS
  Filled 2015-12-17 (×5): qty 1

## 2015-12-17 NOTE — Progress Notes (Signed)
Patient is admitted to room 240 with the diagnosis of NSTEMI. Alert and oriented x 4 but drowsy.  Tele box called to CCMD with Misty StanleyLisa as a second Training and development officerverifier. Skin assessment done with Misty StanleyLisa RN, noted bruises on the abdomen and right LE, abrasion to the right forearm and bites to the right ankle. Bed alarm activated and the bed is in the lowest position. Will continue to monitor.

## 2015-12-17 NOTE — Progress Notes (Signed)
Patient woke up yelling for pain medication. There's no pain medication on MAR. Dr. Tobi BastosPyreddy notified with a new order for Morphine IV 2 mg every 4 hours as needed for moderate to severe pain. Will continue to monitor.

## 2015-12-17 NOTE — Care Management (Signed)
Admitted with nstemi.  Mildly elevated troponin.  cardiology consult pending.  Chronic home 02 through [facility].  Seven ED visits during the month of September.  Independent in all adls, denies issues accessing medical care, obtaining medications or with transportation.  Current with  PCP.

## 2015-12-17 NOTE — ED Notes (Signed)
CRITICAL LAB: TROPONIN is 0.08 , [facility], Dr. York CeriseForbach notified, orders recieved

## 2015-12-17 NOTE — Progress Notes (Addendum)
ANTICOAGULATION CONSULT NOTE - Initial Consult  Pharmacy Consult for heparin Indication: chest pain/ACS  No Known Allergies  Patient Measurements: Height: 5\' 7"  (170.2 cm) Weight: 212 lb (96.2 kg) IBW/kg (Calculated) : 66.1 Heparin Dosing Weight: 86.7 kg  Vital Signs: Temp: 97.9 F (36.6 C) (10/01 2247) Temp Source: Oral (10/01 2247) BP: 119/88 (10/01 2317) Pulse Rate: 92 (10/01 2317)  Labs:  Recent Labs  12/14/15 0409 12/15/15 2216 12/16/15 0235 12/16/15 2242 12/16/15 2312  HGB 14.1 16.4  --   --  15.5  HCT 42.7 51.0  --   --  48.4  PLT 115* 129*  --   --  109*  CREATININE 1.12 1.19  --  1.16  --   TROPONINI  --  <0.03 <0.03 0.08*  --     Estimated Creatinine Clearance: 82.3 mL/min (by C-G formula based on SCr of 1.16 mg/dL).   Medical History: Past Medical History:  Diagnosis Date  . Anxiety   . Asthma   . Bipolar disorder (HCC)   . Chronic pain   . Esophageal stricture   . GERD (gastroesophageal reflux disease)   . H/O tonic-clonic seizures   . Hypertension   . Polysubstance abuse     Medications:  Infusions:  . heparin      Assessment: 50 yom via EMS c/o CP with positive troponin x 1. Pharmacy consulted to dose UFH for ACS/NSTEMI. PTA med list does not show OP AC.  Goal of Therapy:  Heparin level 0.3-0.7 units/ml Monitor platelets by anticoagulation protocol: Yes   Plan:  Give 4000 units bolus x 1 Start heparin infusion at 1000 units/hr Check anti-Xa level in 6 hours and daily while on heparin Continue to monitor H&H and platelets  Carola FrostNathan A Circe Chilton, Pharm.D., BCPS Clinical Pharmacist 12/17/2015,12:29 AM
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
## 2015-12-17 NOTE — Progress Notes (Signed)
Patient alert and oriented, able to make needs known. Compliant of pain in the right side of the chest. MD made aware, new order for percocet prn, administered as ordered. Will continue to monitor.
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
## 2015-12-17 NOTE — Progress Notes (Signed)
Patient  wake up during report and stated he was in pain. Prime doctor paged and Dr. Allena KatzPatel responded to the call. He stated Dr. Luberta MutterKonidena is the MD for the patient  so the RN should page her. DR. Luberta MutterKonidena paged and awaiting a call back. The incoming RN was told to follow up for another pain medicine for the patient.
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
## 2015-12-17 NOTE — Progress Notes (Signed)
ANTICOAGULATION CONSULT NOTE - Initial Consult  Pharmacy Consult for heparin Indication: chest pain/ACS  No Known Allergies  Patient Measurements: Height: 5\' 7"  (170.2 cm) Weight: 212 lb (96.2 kg) IBW/kg (Calculated) : 66.1 Heparin Dosing Weight: 86.7 kg  Vital Signs: Temp: 97.9 F (36.6 C) (10/02 0307) Temp Source: Oral (10/02 0307) BP: 121/89 (10/02 0623) Pulse Rate: 94 (10/02 0623)  Labs:  Recent Labs  12/15/15 2216 12/16/15 0030 12/16/15 0235 12/16/15 2242 12/16/15 2312 12/17/15 0252 12/17/15 0707  HGB 16.4  --   --   --  15.5  --  15.1  HCT 51.0  --   --   --  48.4  --  46.2  PLT 129*  --   --   --  109*  --  106*  APTT  --   --   --   --  <24*  --   --   LABPROT  --  13.4  --   --   --   --  13.4  INR  --  1.02  --   --   --   --  1.02  HEPARINUNFRC  --   --   --   --   --   --  0.31  CREATININE 1.19  --   --  1.16  --   --   --   TROPONINI <0.03  --  <0.03 0.08*  --  0.07*  --     Estimated Creatinine Clearance: 82.3 mL/min (by C-G formula based on SCr of 1.16 mg/dL).   Medical History: Past Medical History:  Diagnosis Date  . Anxiety   . Asthma   . Bipolar disorder (HCC)   . Chronic pain   . Esophageal stricture   . GERD (gastroesophageal reflux disease)   . H/O tonic-clonic seizures   . Hypertension   . Polysubstance abuse     Medications:  Infusions:  . heparin 1,000 Units/hr (12/17/15 0047)    Assessment: 50 yom via EMS c/o CP with positive troponin x 1. Pharmacy consulted to dose UFH for ACS/NSTEMI. PTA med list does not show OP AC.  Goal of Therapy:  Heparin level 0.3-0.7 units/ml Monitor platelets by anticoagulation protocol: Yes   Plan:  10/2 Heparin level @ 0707 = 0.31 (therapeutic). Will continue heparin drip at current rate of 1000 units/hr and order confirmatory heparin level in 6 hours.  Cindi CarbonMary M Tajanae Guilbault, Pharm.D., BCPS Clinical Pharmacist 12/17/2015,7:58 AM

## 2015-12-17 NOTE — ED Notes (Signed)
Bed found ready att 

## 2015-12-18 DIAGNOSIS — I214 Non-ST elevation (NSTEMI) myocardial infarction: Secondary | ICD-10-CM | POA: Diagnosis not present

## 2015-12-18 MED ORDER — OXYCODONE-ACETAMINOPHEN 5-325 MG PO TABS: 1.0000 | ORAL_TABLET | Freq: Four times a day (QID) | ORAL | 0 refills | Status: AC | PRN

## 2015-12-18 NOTE — Care Management (Signed)
nstemi has been ruled out.  CT of chest pending to rule out pulmonary embolus.  would anticipate discharge today if this is negative

## 2015-12-18 NOTE — Progress Notes (Signed)
   12/18/15 0900  Clinical Encounter Type  Visited With Patient;Health care provider  Visit Type Psychological support;Spiritual support;Social support  Referral From Care management  Consult/Referral To Chaplain  Recommendations Patient is said to be 'bipolar' and the staff has requested that the Chaplain stop by and see the patient and see how he is doing.   Spiritual Encounters  Spiritual Needs Prayer;Emotional;Grief support  Stress Factors  Patient Stress Factors Health changes;Loss of control;Other (Comment)  Family Stress Factors None identified  Advance Directives (For Healthcare)  Does patient have an advance directive? Yes  Would patient like information on creating an advanced directive? Yes - Spiritual care consult ordered    Patient is in a good mood. I was stopped by the staff and asked would I go by and spend some time with the patient. The patient was upset and slightly volatile yesterday morning, but has calmed down and warmly greets me and has conversation. We have prayed twice and continue conversation throughout the day.

## 2015-12-18 NOTE — Progress Notes (Signed)
Discharge instructions given to patient. Patient verbalized understanding. Educated patient about importance of quitting smoking. IV and telemetry removed. Gave patient hard copy prescription. No distress at this time.  Taxi called for patient and patient awaiting transportation.
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
## 2015-12-23 ENCOUNTER — Encounter: Payer: Self-pay | Admitting: Emergency Medicine

## 2015-12-23 ENCOUNTER — Emergency Department: Payer: Medicaid Other

## 2015-12-23 ENCOUNTER — Emergency Department
Admission: EM | Admit: 2015-12-23 | Discharge: 2015-12-23 | Disposition: A | Discharge status: Home / Self Care | Payer: Medicaid Other | Attending: Emergency Medicine | Admitting: Emergency Medicine

## 2015-12-23 DIAGNOSIS — N5082 Scrotal pain: Secondary | ICD-10-CM | POA: Diagnosis present

## 2015-12-23 DIAGNOSIS — Z79899 Other long term (current) drug therapy: Secondary | ICD-10-CM | POA: Diagnosis not present

## 2015-12-23 DIAGNOSIS — R1030 Lower abdominal pain, unspecified: Secondary | ICD-10-CM | POA: Insufficient documentation

## 2015-12-23 DIAGNOSIS — R309 Painful micturition, unspecified: Secondary | ICD-10-CM | POA: Diagnosis not present

## 2015-12-23 DIAGNOSIS — Z7982 Long term (current) use of aspirin: Secondary | ICD-10-CM | POA: Diagnosis not present

## 2015-12-23 DIAGNOSIS — J45909 Unspecified asthma, uncomplicated: Secondary | ICD-10-CM | POA: Diagnosis not present

## 2015-12-23 DIAGNOSIS — F17210 Nicotine dependence, cigarettes, uncomplicated: Secondary | ICD-10-CM | POA: Diagnosis not present

## 2015-12-23 DIAGNOSIS — R350 Frequency of micturition: Secondary | ICD-10-CM | POA: Diagnosis not present

## 2015-12-23 DIAGNOSIS — Z791 Long term (current) use of non-steroidal anti-inflammatories (NSAID): Secondary | ICD-10-CM | POA: Insufficient documentation

## 2015-12-23 DIAGNOSIS — I10 Essential (primary) hypertension: Secondary | ICD-10-CM | POA: Insufficient documentation

## 2015-12-23 DIAGNOSIS — L259 Unspecified contact dermatitis, unspecified cause: Secondary | ICD-10-CM | POA: Insufficient documentation

## 2015-12-23 LAB — CBC WITH DIFFERENTIAL/PLATELET
BASOS ABS: 0 10*3/uL (ref 0–0.1)
BASOS PCT: 1 %
Eosinophils Absolute: 0.1 10*3/uL (ref 0–0.7)
Eosinophils Relative: 1 %
HEMATOCRIT: 52.4 % — AB (ref 40.0–52.0)
HEMOGLOBIN: 17 g/dL (ref 13.0–18.0)
Lymphocytes Relative: 5 %
Lymphs Abs: 0.5 10*3/uL — ABNORMAL LOW (ref 1.0–3.6)
MCH: 29.3 pg (ref 26.0–34.0)
MCHC: 32.5 g/dL (ref 32.0–36.0)
MCV: 90.1 fL (ref 80.0–100.0)
MONOS PCT: 7 %
Monocytes Absolute: 0.7 10*3/uL (ref 0.2–1.0)
NEUTROS ABS: 8.5 10*3/uL — AB (ref 1.4–6.5)
NEUTROS PCT: 86 %
Platelets: 167 10*3/uL (ref 150–440)
RBC: 5.81 MIL/uL (ref 4.40–5.90)
RDW: 17.4 % — ABNORMAL HIGH (ref 11.5–14.5)
WBC: 9.9 10*3/uL (ref 3.8–10.6)

## 2015-12-23 LAB — COMPREHENSIVE METABOLIC PANEL
ALBUMIN: 4.3 g/dL (ref 3.5–5.0)
ALK PHOS: 60 U/L (ref 38–126)
ALT: 46 U/L (ref 17–63)
AST: 31 U/L (ref 15–41)
Anion gap: 9 (ref 5–15)
BILIRUBIN TOTAL: 0.8 mg/dL (ref 0.3–1.2)
BUN: 16 mg/dL (ref 6–20)
CALCIUM: 9.2 mg/dL (ref 8.9–10.3)
CO2: 38 mmol/L — ABNORMAL HIGH (ref 22–32)
Chloride: 89 mmol/L — ABNORMAL LOW (ref 101–111)
Creatinine, Ser: 1.1 mg/dL (ref 0.61–1.24)
GFR calc Af Amer: >60 mL/min (ref >60)
GFR calc non Af Amer: >60 mL/min (ref >60)
GLUCOSE: 140 mg/dL — AB (ref 65–99)
Potassium: 3.4 mmol/L — ABNORMAL LOW (ref 3.5–5.1)
Sodium: 136 mmol/L (ref 135–145)
TOTAL PROTEIN: 7.7 g/dL (ref 6.5–8.1)

## 2015-12-23 LAB — URINALYSIS COMPLETE WITH MICROSCOPIC (ARMC ONLY)
BILIRUBIN URINE: NEGATIVE
Bacteria, UA: NONE SEEN
HGB URINE DIPSTICK: NEGATIVE
KETONES UR: NEGATIVE mg/dL
LEUKOCYTES UA: NEGATIVE
NITRITE: NEGATIVE
PH: 7 (ref 5.0–8.0)
Protein, ur: 30 mg/dL — AB
SPECIFIC GRAVITY, URINE: 1.016 (ref 1.005–1.030)
Squamous Epithelial / LPF: NONE SEEN

## 2015-12-23 LAB — GLUCOSE, CAPILLARY: Glucose-Capillary: 141 mg/dL — ABNORMAL HIGH (ref 65–99)

## 2015-12-23 MED ORDER — LORAZEPAM 1 MG PO TABS
1.0000 mg | ORAL_TABLET | Freq: Once | ORAL | Status: AC
  Administered 2015-12-23: 1 mg via ORAL
  Filled 2015-12-23: qty 1

## 2015-12-23 MED ORDER — TRIAMCINOLONE ACETONIDE 0.1 % EX CREA: 1.0000 "application " | TOPICAL_CREAM | Freq: Two times a day (BID) | CUTANEOUS | 0 refills | Status: AC

## 2015-12-23 MED ORDER — ACETAMINOPHEN 325 MG PO TABS
650.0000 mg | ORAL_TABLET | Freq: Once | ORAL | Status: AC
  Administered 2015-12-23: 650 mg via ORAL
  Filled 2015-12-23: qty 2

## 2015-12-23 MED ORDER — CLONAZEPAM 0.5 MG PO TABS
0.2500 mg | ORAL_TABLET | Freq: Once | ORAL | Status: AC
  Administered 2015-12-23: 0.25 mg via ORAL
  Filled 2015-12-23: qty 1

## 2015-12-23 MED ORDER — TRAZODONE HCL 50 MG PO TABS
50.0000 mg | ORAL_TABLET | Freq: Every day | ORAL | Status: DC
  Administered 2015-12-23: 50 mg via ORAL
  Filled 2015-12-23: qty 1

## 2015-12-23 NOTE — ED Triage Notes (Signed)
[facility] for polyuria and dysuria, Patient states that he is having pain in his scrotum and that his scrotum is "blue". Patient states that he has been urinating about 20 times per day and has had increased thirst.

## 2015-12-23 NOTE — ED Notes (Signed)
Pt states he doesn't need oxygen "all the time" and states he will be fine in the waiting room without any.

## 2015-12-23 NOTE — ED Notes (Signed)
RN to room to check on patient. RN states that his legs are cramping and that he needed pain medication. RN informed patient that we had given him tylenol for pain and that we had also given him lorazepam. Patient states that these medications are not helping and that his "anxiety and bipolar are going to get out of control and you don't want to see that." Patient request to speak with the person in charge.  Charge nurse, Tammy SoursGreg informed that patient would like to speak with him

## 2015-12-23 NOTE — ED Notes (Signed)
Bladder scan done with Dr. Huel CoteQuigley at bedside. 169 mL noted on scan

## 2015-12-23 NOTE — Discharge Instructions (Signed)
Please return immediately if condition worsens. Please contact her primary physician or the physician you were given for referral. If you have any specialist physicians involved in her treatment and plan please also contact them. Thank you for using [facility] [place]. ° °

## 2015-12-23 NOTE — ED Provider Notes (Signed)
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 opacity. No consolidation. Heart size and pulmonary vascularity are normal. There are several calcified lymph nodes without adenopathy evident. Supine and upright abdomen: There is diffuse stool throughout the colon. There is no bowel dilatation or air-fluid levels to suggest obstruction. No free air. There is a sizable bone island in the right iliac crest, stable. IMPRESSION: Diffuse stool throughout colon. No bowel obstruction or free air. Diffuse reticulonodular interstitial lung disease, stable. No edema or consolidation. Electronically Signed   By: Bretta Bang III M.D.   On: 12/03/2015 19:40  " I personally reviewed the radiologic studies  Patient did have some review of his most recent abdominal CT studies. He appears to have some constipation. The ultrasound performed today shows no testicular torsion or any abnormalities.  ED Course:  The patient appears to have some erythema on the scrotal sacs which she keeps describing as pain but on further questioning it seems to be more pleuritic in nature. I felt this most likely was a contact dermatitis and doesn't appear to be cellulitis at this point. Patient was prescribed triamcinolone cream. The patient was given  some trazodone and Klonopin here which shows normal medications for pain and anxiety. He was also given Tylenol initially upon evaluation. He had a bladder scan which did not show any signs of retained urine and I felt this was unlikely to be prostatitis or prostatic hypertrophy. He's had borderline diabetes discussions in the past and since he's had a slightly elevated blood sugar along with glucose in his urine it may be that he's drifting toward non-insulin-dependent diabetes at this time. He has a follow-up appointment with his primary physician on Tuesday and he was advised to keep that appointment. He denies any chest pain or shortness of breath and has had a recent admission for cardiovascular disease. Clinical Course     Assessment: Contact dermatitis of the scrotal region Hyperglycemia     Plan:  Outpatient " Discharge Medication List as of 12/23/2015  7:13 PM    "Triamcinolone cream Patient was advised to return immediately if condition worsens. Patient was advised to follow up with their primary care physician or other specialized physicians involved in their outpatient care. The patient and/or family member/power of attorney had laboratory results reviewed at the bedside. All questions and concerns were addressed and appropriate discharge instructions were distributed by the nursing staff.             Jennye Moccasin, MD 12/23/15 2011

## 2015-12-25 ENCOUNTER — Ambulatory Visit: Payer: Self-pay | Admitting: Urology

## 2015-12-25 DIAGNOSIS — R3915 Urgency of urination: Secondary | ICD-10-CM | POA: Insufficient documentation

## 2015-12-25 DIAGNOSIS — Z125 Encounter for screening for malignant neoplasm of prostate: Secondary | ICD-10-CM | POA: Insufficient documentation

## 2015-12-25 LAB — URINE CULTURE

## 2015-12-25 NOTE — Progress Notes (Deleted)
12/25/2015 8:40 AM   [name] August 18, 1965 [id number]  Referring provider: Judeen Hammans, MD [address] [place], [place] [address]  No chief complaint on file.   HPI:  1. Urinary urgency - new bother from irritative symptoms 12/2015. Severe glycosuria at time and on prednisone taper. Prior A1c normal. DRE 2017 30gm smooth. UA and UCX normal x several.  2. Prostate cancer screening - XXX FHX prostate cancer 12/2015 DRE 30gm smooth / PSA (today / pending)  PMH sig for bipolar, CAD/MI, SZ.  Today "[name]" is seen as new patient for above. He is referred from [facility] ER where he appears to visit very frequently.   PMH: Past Medical History:  Diagnosis Date  . Anxiety   . Asthma   . Bipolar disorder (HCC)   . Chronic pain   . Esophageal stricture   . GERD (gastroesophageal reflux disease)   . H/O tonic-clonic seizures   . Hypertension   . Polysubstance abuse     Surgical History: Past Surgical History:  Procedure Laterality Date  . APPENDECTOMY     complicated by perforation and abscess  . ESOPHAGOGASTRODUODENOSCOPY (EGD) WITH ESOPHAGEAL DILATION    . HERNIA REPAIR  Nov 2016   ventral hernia repair at [facility] by Dr. Genene Churn  . PEG TUBE REMOVAL    . PEG W/TRACHEOSTOMY PLACEMENT    . TRACHEOSTOMY CLOSURE      Home Medications:    Medication List       Accurate as of 12/25/15  8:40 AM. Always use your most recent med list.          albuterol 108 (90 Base) MCG/ACT inhaler Commonly known as:  PROVENTIL HFA;VENTOLIN HFA Inhale 2 puffs into the lungs every 6 (six) hours as needed for wheezing or shortness of breath.   albuterol (2.5 MG/3ML) 0.083% nebulizer solution Commonly known as:  PROVENTIL Take 3 mLs (2.5 mg total) by nebulization every 2 (two) hours as needed for wheezing.   aspirin EC 81 MG tablet Start from oct 2nd   atorvastatin 10 MG tablet Commonly known as:  LIPITOR Take 10 mg by mouth every evening.   busPIRone 15 MG  tablet Commonly known as:  BUSPAR Take 1 tablet (15 mg total) by mouth 3 (three) times daily.   clonazePAM 0.5 MG tablet Commonly known as:  KLONOPIN Take 1 tablet (0.5 mg total) by mouth 3 (three) times daily.   diflunisal 500 MG Tabs tablet Commonly known as:  DOLOBID Take 1 tablet (500 mg total) by mouth 2 (two) times daily as needed.   fenofibrate 145 MG tablet Commonly known as:  TRICOR Take 145 mg by mouth daily.   FLUoxetine 20 MG capsule Commonly known as:  PROZAC Take 1 capsule (20 mg total) by mouth daily.   fluticasone 50 MCG/ACT nasal spray Commonly known as:  FLONASE Place 2 sprays into both nostrils daily.   hydrochlorothiazide 25 MG tablet Commonly known as:  HYDRODIURIL Take 25 mg by mouth daily.   LATUDA 60 MG Tabs Generic drug:  Lurasidone HCl Take 1 tablet by mouth every evening.   loratadine 10 MG tablet Commonly known as:  CLARITIN Take 10 mg by mouth daily.   metoprolol succinate 50 MG 24 hr tablet Commonly known as:  TOPROL-XL Take 50 mg by mouth daily.   nicotine 14 mg/24hr patch Commonly known as:  NICODERM CQ - dosed in mg/24 hours Place 1 patch (14 mg total) onto the skin daily.   oxyCODONE-acetaminophen  5-325 MG tablet Commonly known as:  PERCOCET/ROXICET Take 1 tablet by mouth every 6 (six) hours as needed for moderate pain.   pantoprazole 40 MG tablet Commonly known as:  PROTONIX Take 1 tablet (40 mg total) by mouth daily.   polyethylene glycol packet Commonly known as:  MIRALAX / GLYCOLAX Take 17 g by mouth daily.   predniSONE 10 MG tablet Commonly known as:  DELTASONE Take 5 tablets (50 mg total) by mouth daily with breakfast. Take 50 mg daily taper by 10 mg daily then stop   QUEtiapine 100 MG tablet Commonly known as:  SEROQUEL Take 1 tablet (100 mg total) by mouth at bedtime.   triamcinolone cream 0.1 % Commonly known as:  KENALOG Apply 1 application topically 2 (two) times daily.       Allergies: No Known  Allergies  Family History: Family History  Problem Relation Age of Onset  . CAD Other   . Diabetes Other     Social History:  reports that he has been smoking Cigarettes.  He has been smoking about 1.00 pack per day. He has never used smokeless tobacco. He reports that he uses drugs, including Marijuana. He reports that he does not drink alcohol.    Review of Systems  Gastrointestinal (upper)  : Negative for upper GI symptoms  Gastrointestinal (lower) : Negative for lower GI symptoms  Constitutional : Negative for symptoms  Skin: Negative for skin symptoms  Eyes: Negative for eye symptoms  Ear/Nose/Throat : Negative for Ear/Nose/Throat symptoms  Hematologic/Lymphatic: Negative for Hematologic/Lymphatic symptoms  Cardiovascular : Negative for cardiovascular symptoms  Respiratory : Negative for respiratory symptoms  Endocrine: Negative for endocrine symptoms  Musculoskeletal: Negative for musculoskeletal symptoms  Neurological: Negative for neurological symptoms  Psychologic: Negative for psychiatric symptoms       Physical Exam: There were no vitals taken for this visit.  Constitutional:  Alert and oriented, No acute distress. HEENT: [place] AT, moist mucus membranes.  Trachea midline, no masses. Cardiovascular: No clubbing, cyanosis, or edema. Respiratory: Normal respiratory effort, no increased work of breathing. GI: Abdomen is soft, nontender, nondistended, no abdominal masses GU: No CVA tenderness. DRE 30gm smooth. Phallus straight. NO testes measses.  Skin: No rashes, bruises or suspicious lesions. Lymph: No cervical or inguinal adenopathy. Neurologic: Grossly intact, no focal deficits, moving all 4 extremities. Psychiatric: Normal mood and affect.  Laboratory Data: Lab Results  Component Value Date   WBC 9.9 12/23/2015   HGB 17.0 12/23/2015   HCT 52.4 (H) 12/23/2015   MCV 90.1 12/23/2015   PLT 167 12/23/2015    Lab Results  Component  Value Date   CREATININE 1.10 12/23/2015    No results found for: PSA  No results found for: TESTOSTERONE  Lab Results  Component Value Date   HGBA1C 6.0 01/31/2015    Urinalysis    Component Value Date/Time   COLORURINE YELLOW (A) 12/23/2015 1538   APPEARANCEUR CLEAR (A) 12/23/2015 1538   APPEARANCEUR Clear 12/04/2013 0203   LABSPEC 1.016 12/23/2015 1538   LABSPEC 1.009 12/04/2013 0203   PHURINE 7.0 12/23/2015 1538   GLUCOSEU >500 (A) 12/23/2015 1538   GLUCOSEU Negative 12/04/2013 0203   HGBUR NEGATIVE 12/23/2015 1538   BILIRUBINUR NEGATIVE 12/23/2015 1538   BILIRUBINUR Negative 12/04/2013 0203   KETONESUR NEGATIVE 12/23/2015 1538   PROTEINUR 30 (A) 12/23/2015 1538   NITRITE NEGATIVE 12/23/2015 1538   LEUKOCYTESUR NEGATIVE 12/23/2015 1538   LEUKOCYTESUR Negative 12/04/2013 0203    Pertinent Imaging: CT from 2017 reviewed.  No hydro / stones / GU masses.   Assessment & Plan:    1. Urinary urgency - this is likely from glycosuria / prednisone side effect by history and labs. His exam is otherwise unimpressive and he empties well. UA without infectious parameters x several. DO not rec pharmacotherapy to simply treat side effect of another short term medication. This will likely resolve when off prednisone.   2. Prostate cancer screening - PSA today.  RTC Urol prn.     Sebastian AcheMANNY, Rhone Ozaki, MD  [facility] [address], [address] [place], [place] [address] [phone]

## 2016-01-11 ENCOUNTER — Ambulatory Visit: Payer: Self-pay

## 2016-01-22 ENCOUNTER — Ambulatory Visit: Payer: Self-pay | Admitting: Urology

## 2016-01-22 ENCOUNTER — Encounter: Payer: Self-pay | Admitting: Urology

## 2016-01-24 ENCOUNTER — Ambulatory Visit: Admission: RE | Admit: 2016-01-24 | Payer: Medicaid Other | Source: Ambulatory Visit | Admitting: Ophthalmology

## 2016-01-24 ENCOUNTER — Encounter: Admission: RE | Payer: Self-pay | Source: Ambulatory Visit

## 2016-01-24 SURGERY — PHACOEMULSIFICATION, CATARACT, WITH IOL INSERTION
Anesthesia: Choice | Laterality: Right

## 2016-02-14 ENCOUNTER — Ambulatory Visit (INDEPENDENT_AMBULATORY_CARE_PROVIDER_SITE_OTHER): Payer: Medicaid Other | Admitting: Urology

## 2016-02-14 ENCOUNTER — Encounter: Payer: Self-pay | Admitting: Urology

## 2016-02-14 VITALS — BP 113/77 | HR 111 | Ht 67.0 in | Wt 185.0 lb

## 2016-02-14 DIAGNOSIS — N401 Enlarged prostate with lower urinary tract symptoms: Secondary | ICD-10-CM | POA: Diagnosis not present

## 2016-02-14 DIAGNOSIS — R350 Frequency of micturition: Secondary | ICD-10-CM

## 2016-02-14 DIAGNOSIS — S82899A Other fracture of unspecified lower leg, initial encounter for closed fracture: Secondary | ICD-10-CM | POA: Insufficient documentation

## 2016-02-14 DIAGNOSIS — K219 Gastro-esophageal reflux disease without esophagitis: Secondary | ICD-10-CM | POA: Insufficient documentation

## 2016-02-14 DIAGNOSIS — N3281 Overactive bladder: Secondary | ICD-10-CM

## 2016-02-14 DIAGNOSIS — M1990 Unspecified osteoarthritis, unspecified site: Secondary | ICD-10-CM | POA: Insufficient documentation

## 2016-02-14 DIAGNOSIS — Z125 Encounter for screening for malignant neoplasm of prostate: Secondary | ICD-10-CM | POA: Diagnosis not present

## 2016-02-14 DIAGNOSIS — J45909 Unspecified asthma, uncomplicated: Secondary | ICD-10-CM | POA: Insufficient documentation

## 2016-02-14 DIAGNOSIS — K432 Incisional hernia without obstruction or gangrene: Secondary | ICD-10-CM | POA: Insufficient documentation

## 2016-02-14 LAB — URINALYSIS, COMPLETE
Bilirubin, UA: NEGATIVE
Glucose, UA: NEGATIVE
Ketones, UA: NEGATIVE
LEUKOCYTES UA: NEGATIVE
Nitrite, UA: NEGATIVE
PH UA: 7.5 (ref 5.0–7.5)
PROTEIN UA: NEGATIVE
RBC, UA: NEGATIVE
Specific Gravity, UA: 1.015 (ref 1.005–1.030)
Urobilinogen, Ur: 2 mg/dL — ABNORMAL HIGH (ref 0.2–1.0)

## 2016-02-14 LAB — BLADDER SCAN AMB NON-IMAGING

## 2016-02-14 LAB — MICROSCOPIC EXAMINATION: BACTERIA UA: NONE SEEN

## 2016-02-14 MED ORDER — MIRABEGRON ER 25 MG PO TB24: 25.0000 mg | ORAL_TABLET | Freq: Every day | ORAL | 11 refills | Status: DC

## 2016-02-14 NOTE — Progress Notes (Signed)
02/14/2016 3:14 PM   [name] 12/15/1965 [id number]  Referring provider: Judeen HammansMeredith Key Soles, MD [address] [place], [place] [address]  Chief Complaint  Patient presents with  . Urinary Frequency    New Patient    HPI: The patient is a 50 year old gentleman who presents for evaluation of multiple urinary complaints. His main complaint at this time is urinary urgency with urge incontinence. This is what he would like corrected the most. He also complains of frequency often having to the bathroom 5-10 minutes after he finishes. He feels like he doesn't empty his bladder as a result of this. He notes that he has a good stream once he starts going and doesn't feel he has to strain on direct questioning. His I PSS though does rate as 35/6. His PVR today is 0. His biggest complaint is his urinary urge incontinence.  Patient is a current smoker.  Of note he has had a 20-30 pound unintentional weight loss over the last few months.  PVR: 0 cc  PMH: Past Medical History:  Diagnosis Date  . Anxiety   . Asthma   . Bipolar disorder (HCC)   . Chronic pain   . Esophageal stricture   . GERD (gastroesophageal reflux disease)   . H/O tonic-clonic seizures   . Hypertension   . Polysubstance abuse     Surgical History: Past Surgical History:  Procedure Laterality Date  . APPENDECTOMY     complicated by perforation and abscess  . ESOPHAGOGASTRODUODENOSCOPY (EGD) WITH ESOPHAGEAL DILATION    . HERNIA REPAIR  Nov 2016   [facility] by Dr. Genene ChurnBunzendahl  . PEG TUBE REMOVAL    . PEG W/TRACHEOSTOMY PLACEMENT    . TRACHEOSTOMY CLOSURE      Home Medications:    Medication List       Accurate as of 02/14/16  3:14 PM. Always use your most recent med list.          albuterol 108 (90 Base) MCG/ACT inhaler Commonly known as:  PROVENTIL HFA;VENTOLIN HFA Inhale 2 puffs into the lungs every 6 (six) hours as needed for wheezing or shortness of breath.   albuterol  (2.5 MG/3ML) 0.083% nebulizer solution Commonly known as:  PROVENTIL Take 3 mLs (2.5 mg total) by nebulization every 2 (two) hours as needed for wheezing.   aspirin EC 81 MG tablet Start from oct 2nd   atorvastatin 10 MG tablet Commonly known as:  LIPITOR Take 10 mg by mouth every evening.   busPIRone 15 MG tablet Commonly known as:  BUSPAR Take 1 tablet (15 mg total) by mouth 3 (three) times daily.   clonazePAM 0.5 MG tablet Commonly known as:  KLONOPIN Take 1 tablet (0.5 mg total) by mouth 3 (three) times daily.   diflunisal 500 MG Tabs tablet Commonly known as:  DOLOBID Take 1 tablet (500 mg total) by mouth 2 (two) times daily as needed.   EAR DROPS OT Place 1 drop in ear(s) 2 (two) times daily.   fenofibrate 145 MG tablet Commonly known as:  TRICOR Take 145 mg by mouth daily.   FLUoxetine 20 MG capsule Commonly known as:  PROZAC Take 1 capsule (20 mg total) by mouth daily.   fluticasone 50 MCG/ACT nasal spray Commonly known as:  FLONASE Place 2 sprays into both nostrils 2 (two) times daily.   hydrochlorothiazide 25 MG tablet Commonly known as:  HYDRODIURIL Take 25 mg by mouth daily.   LATUDA 60 MG Tabs  Generic drug:  Lurasidone HCl Take 1 tablet by mouth every evening.   loratadine 10 MG tablet Commonly known as:  CLARITIN Take 10 mg by mouth daily.   metoprolol succinate 50 MG 24 hr tablet Commonly known as:  TOPROL-XL Take 50 mg by mouth daily.   mirabegron ER 25 MG Tb24 tablet Commonly known as:  [name] Take 1 tablet (25 mg total) by mouth daily.   nicotine 14 mg/24hr patch Commonly known as:  NICODERM CQ - dosed in mg/24 hours Place 1 patch (14 mg total) onto the skin daily.   oxyCODONE-acetaminophen 5-325 MG tablet Commonly known as:  PERCOCET/ROXICET Take 1 tablet by mouth every 6 (six) hours as needed for moderate pain.   pantoprazole 40 MG tablet Commonly known as:  PROTONIX Take 1 tablet (40 mg total) by mouth daily.   polyethylene  glycol packet Commonly known as:  MIRALAX / GLYCOLAX Take 17 g by mouth daily.   predniSONE 10 MG tablet Commonly known as:  DELTASONE Take 5 tablets (50 mg total) by mouth daily with breakfast. Take 50 mg daily taper by 10 mg daily then stop   QUEtiapine 100 MG tablet Commonly known as:  SEROQUEL Take 1 tablet (100 mg total) by mouth at bedtime.   tetrahydrozoline 0.05 % ophthalmic solution Place 1 drop into both eyes 3 (three) times daily as needed (for dry eyes).   triamcinolone cream 0.1 % Commonly known as:  KENALOG Apply 1 application topically 2 (two) times daily.       Allergies: No Known Allergies  Family History: Family History  Problem Relation Age of Onset  . CAD Other   . Diabetes Other     Social History:  reports that he has been smoking Cigarettes.  He has been smoking about 1.00 pack per day. He has never used smokeless tobacco. He reports that he uses drugs, including Marijuana. He reports that he does not drink alcohol.  ROS: UROLOGY Frequent Urination?: Yes Hard to postpone urination?: Yes Burning/pain with urination?: Yes Get up at night to urinate?: Yes Leakage of urine?: Yes Urine stream starts and stops?: Yes Trouble starting stream?: No Do you have to strain to urinate?: No Blood in urine?: No Urinary tract infection?: No Sexually transmitted disease?: No Injury to kidneys or bladder?: No Painful intercourse?: No Weak stream?: No Erection problems?: Yes Penile pain?: Yes  Gastrointestinal Nausea?: Yes Vomiting?: No Indigestion/heartburn?: Yes Diarrhea?: No Constipation?: Yes  Constitutional Fever: No Night sweats?: Yes Weight loss?: Yes Fatigue?: Yes  Skin Skin rash/lesions?: No Itching?: No  Eyes Blurred vision?: No Double vision?: No  Ears/Nose/Throat Sore throat?: No Sinus problems?: No  Hematologic/Lymphatic Swollen glands?: No Easy bruising?: No  Cardiovascular Leg swelling?: No Chest pain?:  No  Respiratory Cough?: No Shortness of breath?: No  Endocrine Excessive thirst?: No  Musculoskeletal Back pain?: No Joint pain?: No  Neurological Headaches?: No Dizziness?: No  Psychologic Depression?: No Anxiety?: No  Physical Exam: BP 113/77   Pulse (!) 111   Ht 5\' 7"  (1.702 m)   Wt 185 lb (83.9 kg)   BMI 28.98 kg/m   Constitutional:  Alert and oriented, No acute distress. HEENT: [place] AT, moist mucus membranes.  Trachea midline, no masses. Cardiovascular: No clubbing, cyanosis, or edema. Respiratory: Normal respiratory effort, no increased work of breathing. GI: Abdomen is soft, nontender, nondistended, no abdominal masses GU: No CVA tenderness. Normal phallus. Testicles aside bilaterally. No masses. DRE: 1+ benign. Skin: No rashes, bruises or suspicious lesions. Lymph:  No cervical or inguinal adenopathy. Neurologic: Grossly intact, no focal deficits, moving all 4 extremities. Psychiatric: Normal mood and affect.  Laboratory Data: Lab Results  Component Value Date   WBC 9.9 12/23/2015   HGB 17.0 12/23/2015   HCT 52.4 (H) 12/23/2015   MCV 90.1 12/23/2015   PLT 167 12/23/2015    Lab Results  Component Value Date   CREATININE 1.10 12/23/2015    No results found for: PSA  No results found for: TESTOSTERONE  Lab Results  Component Value Date   HGBA1C 6.0 01/31/2015    Urinalysis    Component Value Date/Time   COLORURINE YELLOW (A) 12/23/2015 1538   APPEARANCEUR CLEAR (A) 12/23/2015 1538   APPEARANCEUR Clear 12/04/2013 0203   LABSPEC 1.016 12/23/2015 1538   LABSPEC 1.009 12/04/2013 0203   PHURINE 7.0 12/23/2015 1538   GLUCOSEU >500 (A) 12/23/2015 1538   GLUCOSEU Negative 12/04/2013 0203   HGBUR NEGATIVE 12/23/2015 1538   BILIRUBINUR NEGATIVE 12/23/2015 1538   BILIRUBINUR Negative 12/04/2013 0203   KETONESUR NEGATIVE 12/23/2015 1538   PROTEINUR 30 (A) 12/23/2015 1538   NITRITE NEGATIVE 12/23/2015 1538   LEUKOCYTESUR NEGATIVE 12/23/2015 1538    LEUKOCYTESUR Negative 12/04/2013 0203     Assessment & Plan:   1. OAB [name] symptoms are more consistent with an overactive bladder/urinary urge incontinence. I think he would benefit from a trial of [name] 25 mg daily. He was given samples and a prescription for this. Given his smoking history, we will also check a urine cytology as carcinoma in situ of the bladder can also have these irritative symptoms. He will follow-up in 3 months to assess his progress.  2. BPH As above  3. Prostate cancer screening Due for PSA  Return in about 3 months (around 05/14/2016).  Hildred LaserBrian James Larraine Argo, MD  [facility] [address], [address] [place], [place] [address] [phone]

## 2016-02-15 LAB — PSA TOTAL (REFLEX TO FREE): Prostate Specific Ag, Serum: 0.9 ng/mL (ref 0.0–4.0)

## 2016-02-20 ENCOUNTER — Telehealth: Payer: Self-pay

## 2016-02-20 NOTE — Telephone Encounter (Signed)
Hildred LaserBrian James Budzyn, MD  Skeet Latchhelsea C Watkins, LPN        Please let patient know PSA is normal    [facility]

## 2016-02-21 NOTE — Telephone Encounter (Signed)
LMOM

## 2016-02-22 NOTE — Telephone Encounter (Signed)
Spoke with pt in reference to PSA results. Pt voiced understanding.  

## 2016-02-25 ENCOUNTER — Other Ambulatory Visit: Payer: Self-pay | Admitting: Urology

## 2016-04-24 ENCOUNTER — Ambulatory Visit: Payer: Self-pay | Admitting: Urology

## 2016-04-24 ENCOUNTER — Encounter: Payer: Self-pay | Admitting: Urology

## 2016-05-16 ENCOUNTER — Ambulatory Visit: Payer: Self-pay

## 2016-05-27 ENCOUNTER — Telehealth: Payer: Self-pay | Admitting: Urology

## 2016-05-27 NOTE — Telephone Encounter (Signed)
PA information has been filled out. Will have physician sign when in office and fax over. When results are back will call appropriate people.

## 2016-05-27 NOTE — Telephone Encounter (Signed)
Belinda from patient's PCP [facility] [phone], Ext [phone] called the office today.  They received a call from [facility] [phone]) regarding the patient's prescriptions for our office.  They blister pack the patients medications.  They said that [name] requires a prior authorization and have been unable to obtain since prescribed in Nov.    Please call the pharmacy and get resolution and call Belinda back.

## 2016-05-29 ENCOUNTER — Other Ambulatory Visit: Payer: Self-pay

## 2016-05-29 DIAGNOSIS — N3281 Overactive bladder: Secondary | ICD-10-CM

## 2016-05-29 MED ORDER — MIRABEGRON ER 50 MG PO TB24: 50.0000 mg | ORAL_TABLET | Freq: Every day | ORAL | 3 refills | Status: AC

## 2016-06-05 IMAGING — CT CT HEAD W/O CM
1 series · 16 of 30 positions shown, 20 images · non-contrast
Comparison: Head CT 07/04/2013 and 07/02/2013.

CLINICAL DATA: Lightheadedness with blurred vision for 2 or 3 days.
Initial encounter.

EXAM:
CT HEAD WITHOUT CONTRAST
TECHNIQUE: Contiguous axial images were obtained from the base of the skull
through the vertex without intravenous contrast.

[Series 2: soft tissue · axial · 0.45mm/px · z∈[-110,+30]mm · 16 of 32 slices shown, 20 images]
[im 2/32  brain]
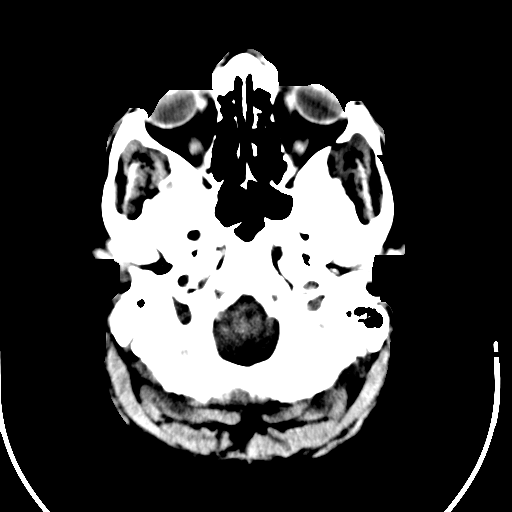
[im 2/32  bone]
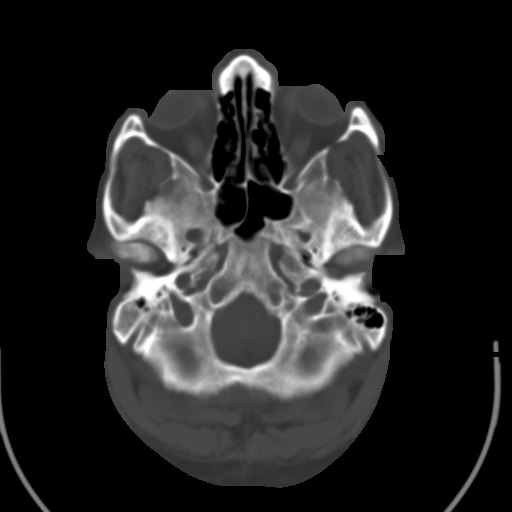
[im 4/32  brain]
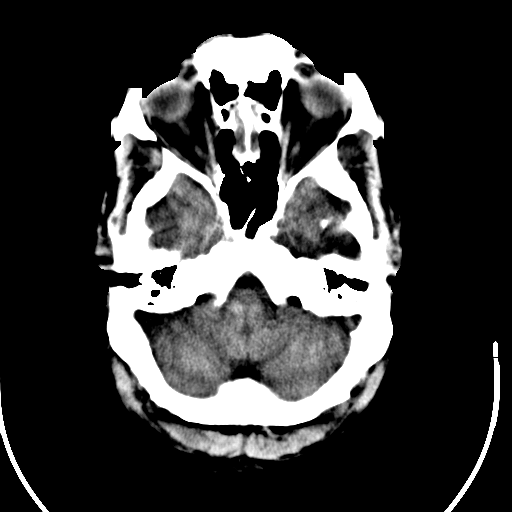
[im 6/32  brain]
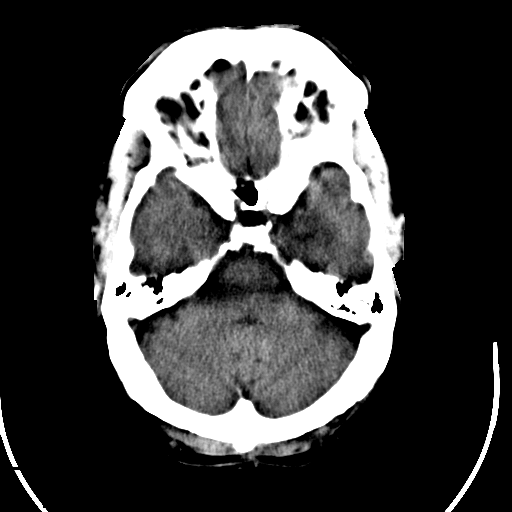
[im 8/32  brain]
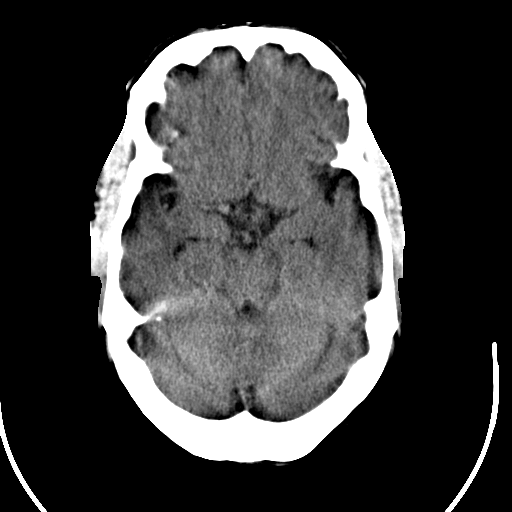
[im 9/32  brain]
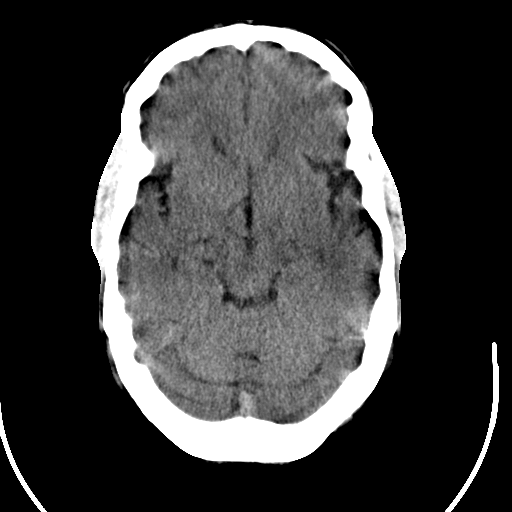
[im 9/32  bone]
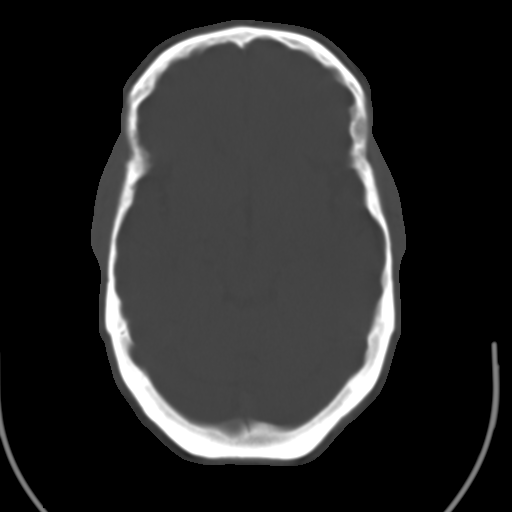
[im 11/32  brain]
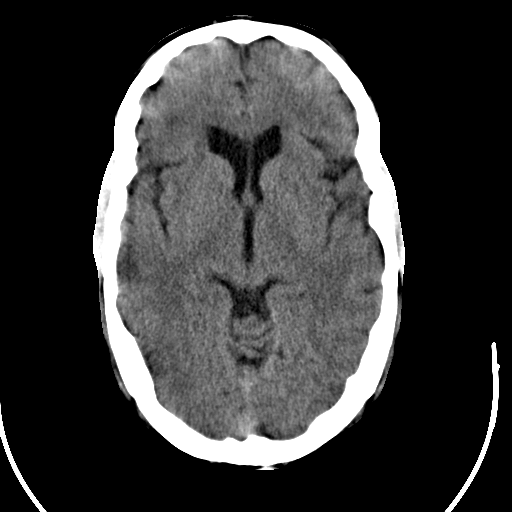
[im 13/32  brain]
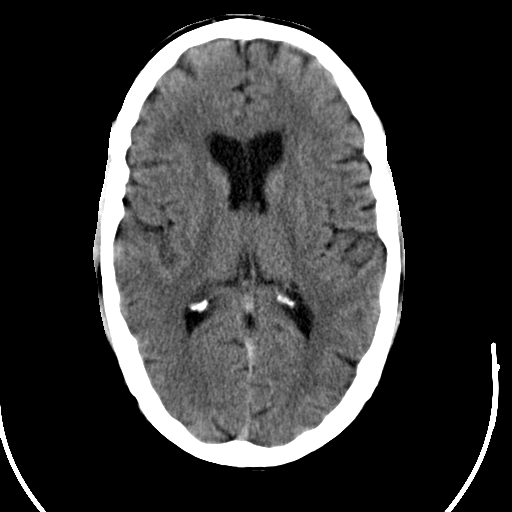
[im 15/32  brain]
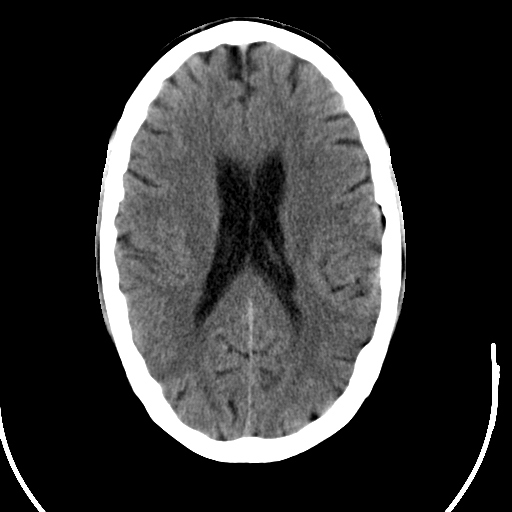
[im 17/32  brain]
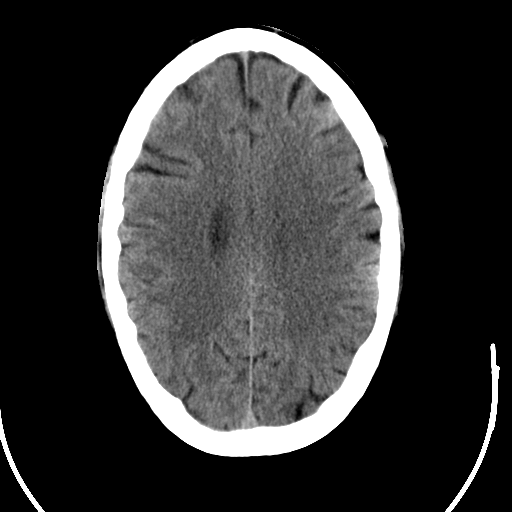
[im 17/32  bone]
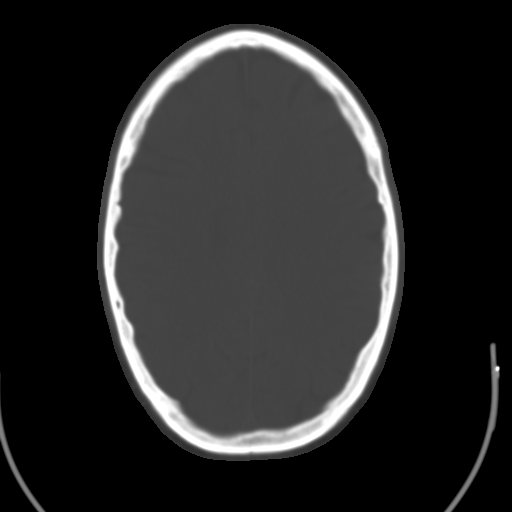
[im 19/32  brain]
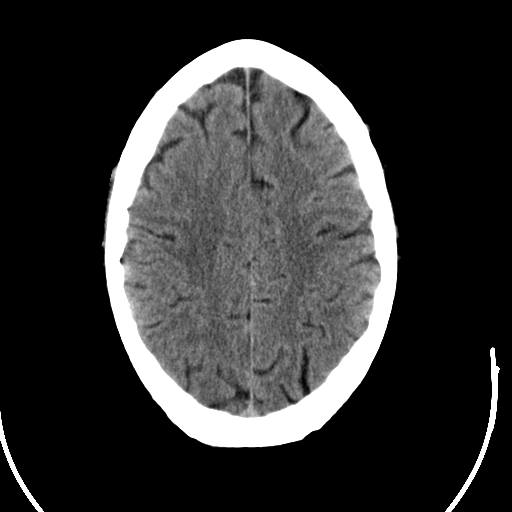
[im 21/32  brain]
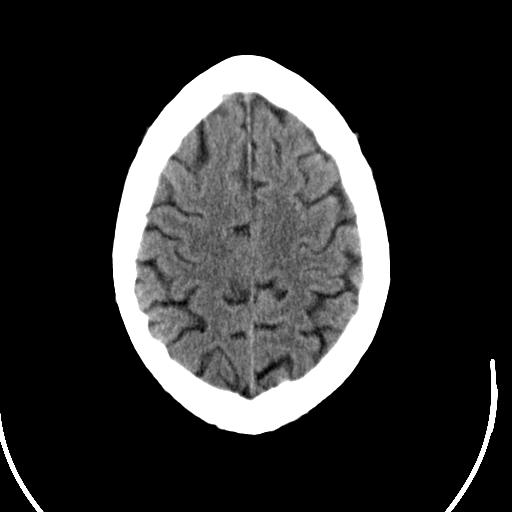
[im 23/32  brain]
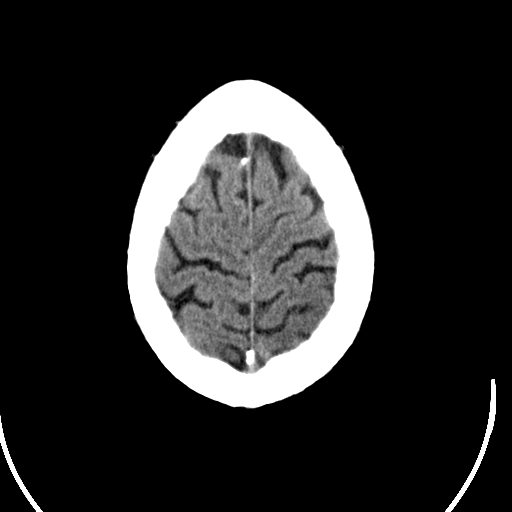
[im 24/32  brain]
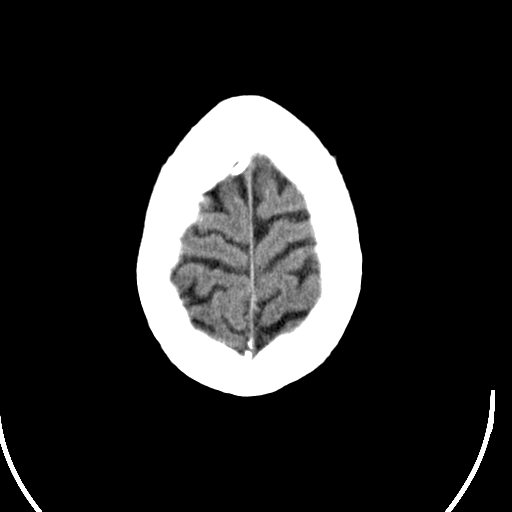
[im 24/32  bone]
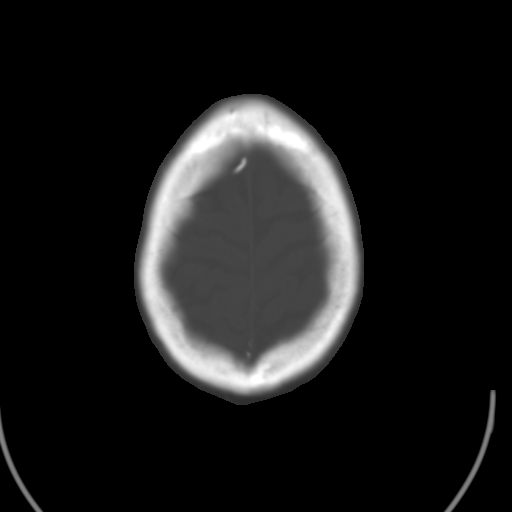
[im 26/32  brain]
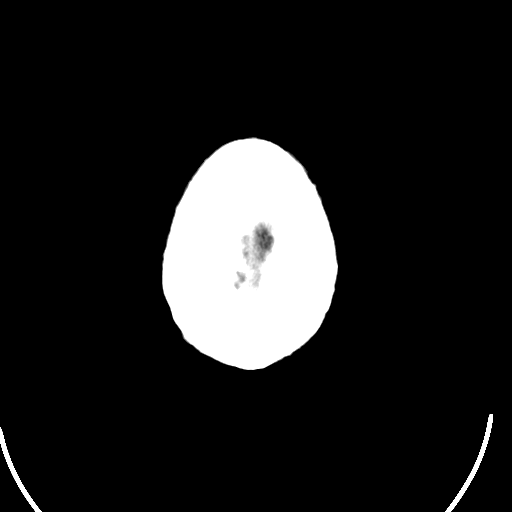
[im 28/32  brain]
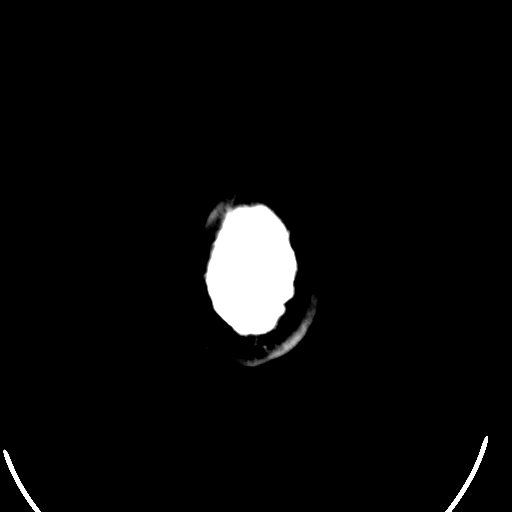
[im 30/32  brain]
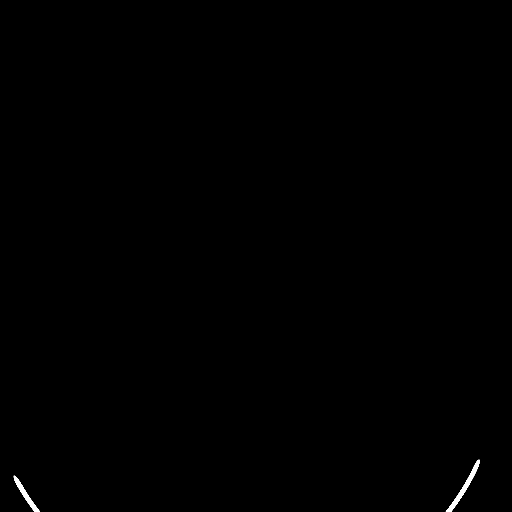

[16 of 30 positions shown; findings below may reference images not displayed]

FINDINGS: There is no evidence of acute intracranial hemorrhage, mass lesion,
brain edema or extra-axial fluid collection. The ventricles and
subarachnoid spaces are mildly prominent but stable. There is no CT
evidence of acute cortical infarction. There is stable mild
periventricular white matter disease, likely due to chronic small
vessel ischemic changes. Intracranial vascular calcifications noted.

The visualized paranasal sinuses, mastoid air cells and middle ears
are clear. There is stable calcification within the right external
auditory canal. The calvarium is intact.
IMPRESSION: Stable head CT demonstrating mild atrophy and small vessel ischemic
change. No acute intracranial findings. Stable probable exostosis in
the right external auditory canal.

## 2016-06-06 IMAGING — CR DG ABDOMEN 1V
2 series · 3 of 3 positions shown · non-contrast
Comparison: 01/30/2015

CLINICAL DATA: Abdominal pain

EXAM:
ABDOMEN - 1 VIEW

[abdomen kub (1 of 2)]
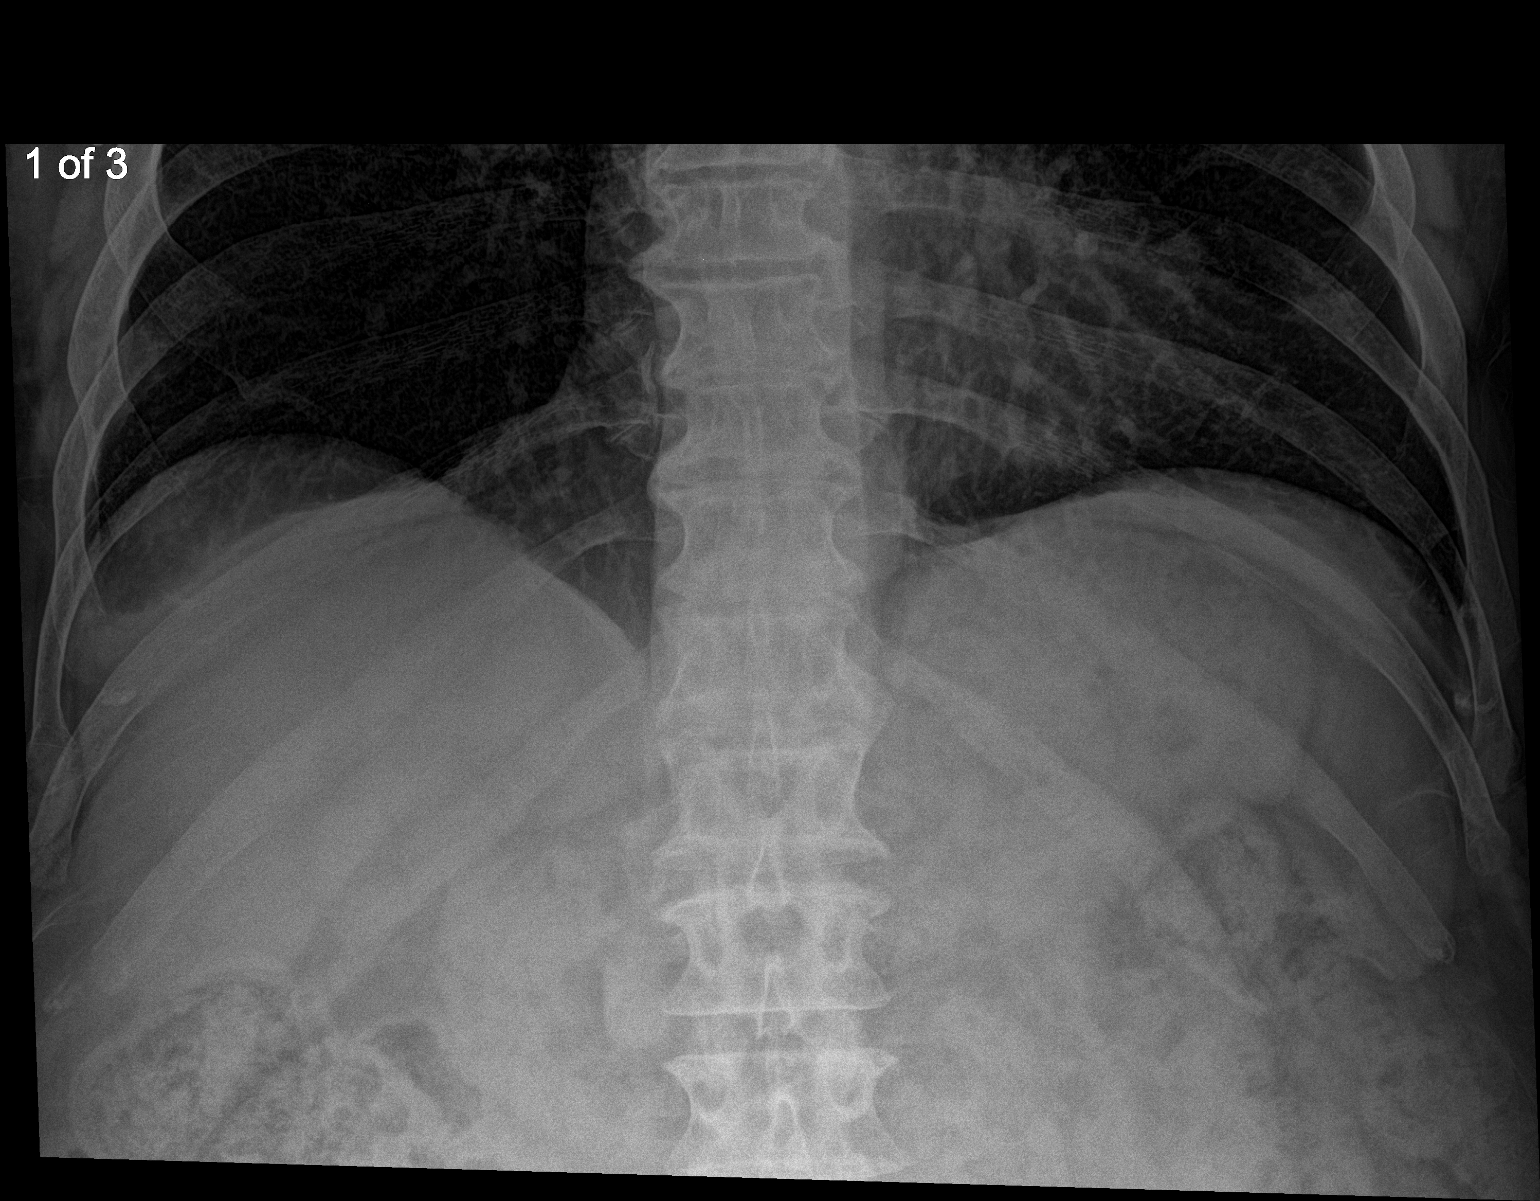

[Series 2: abdomen kub · 0.14mm/px · 2 of 2 slices shown (2 of 2)]
[im 1/2]
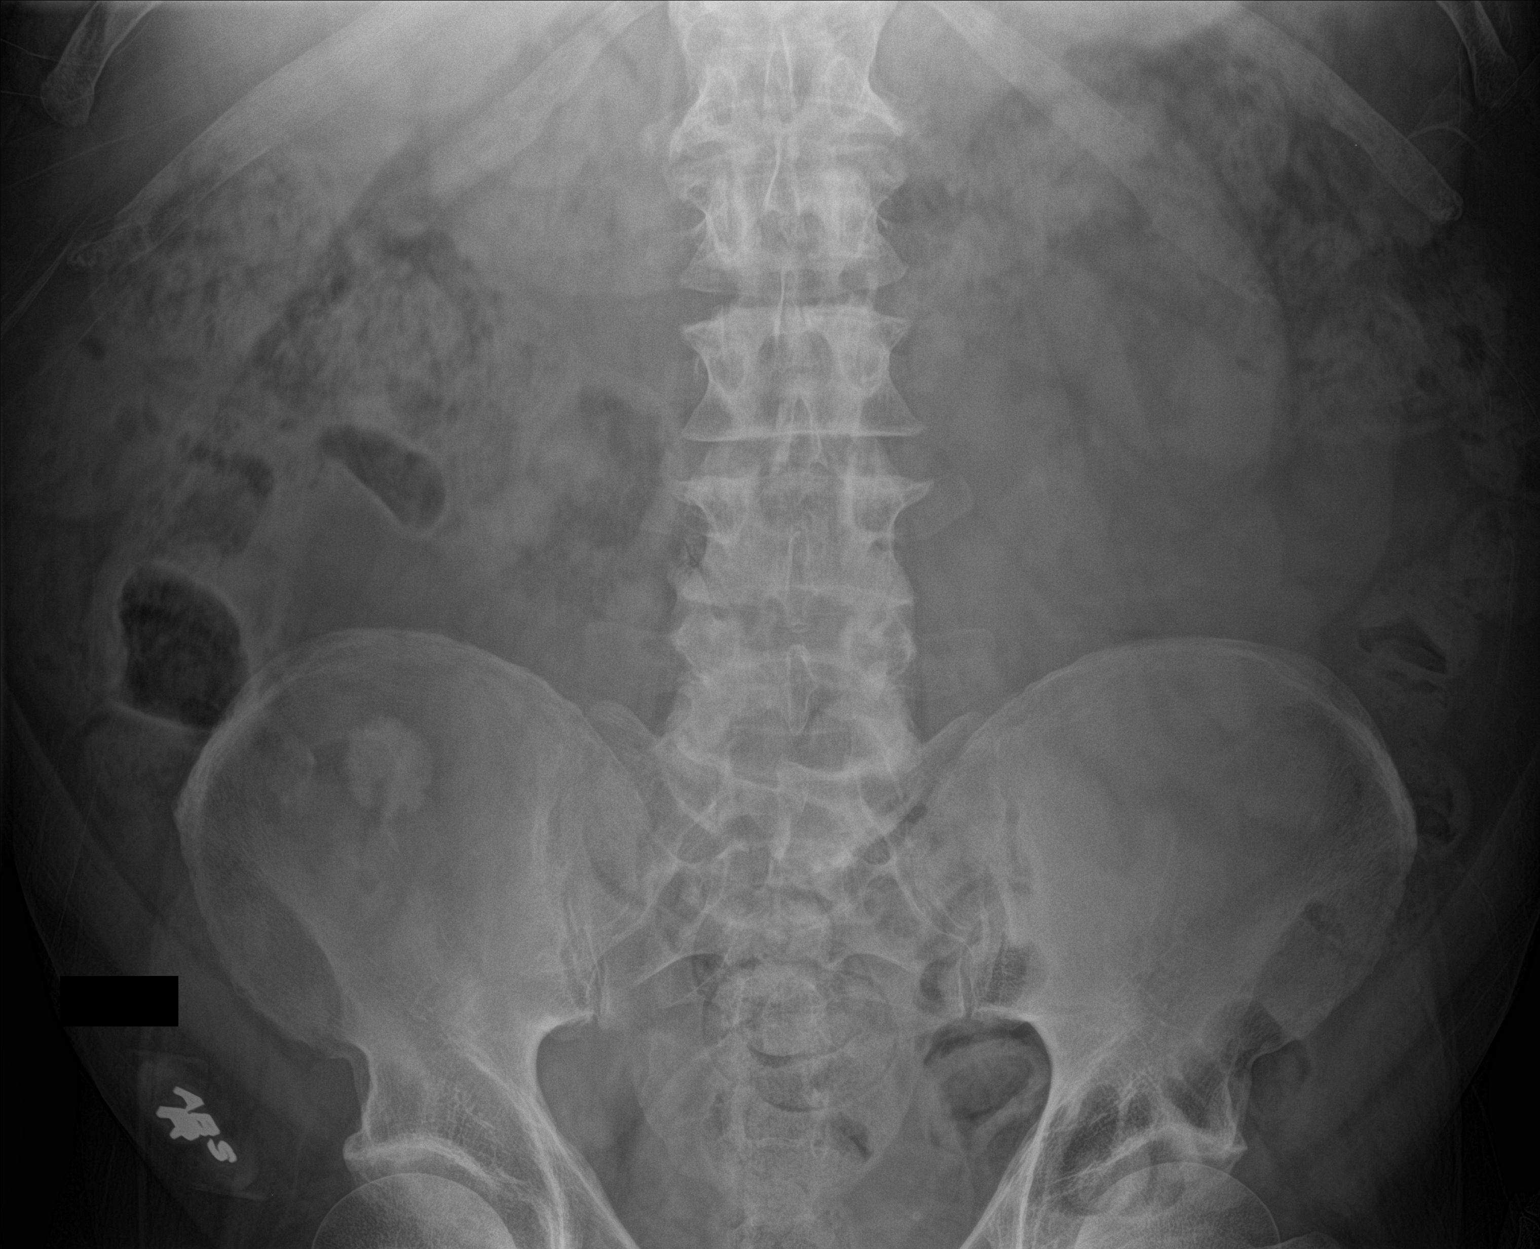
[im 2/2]
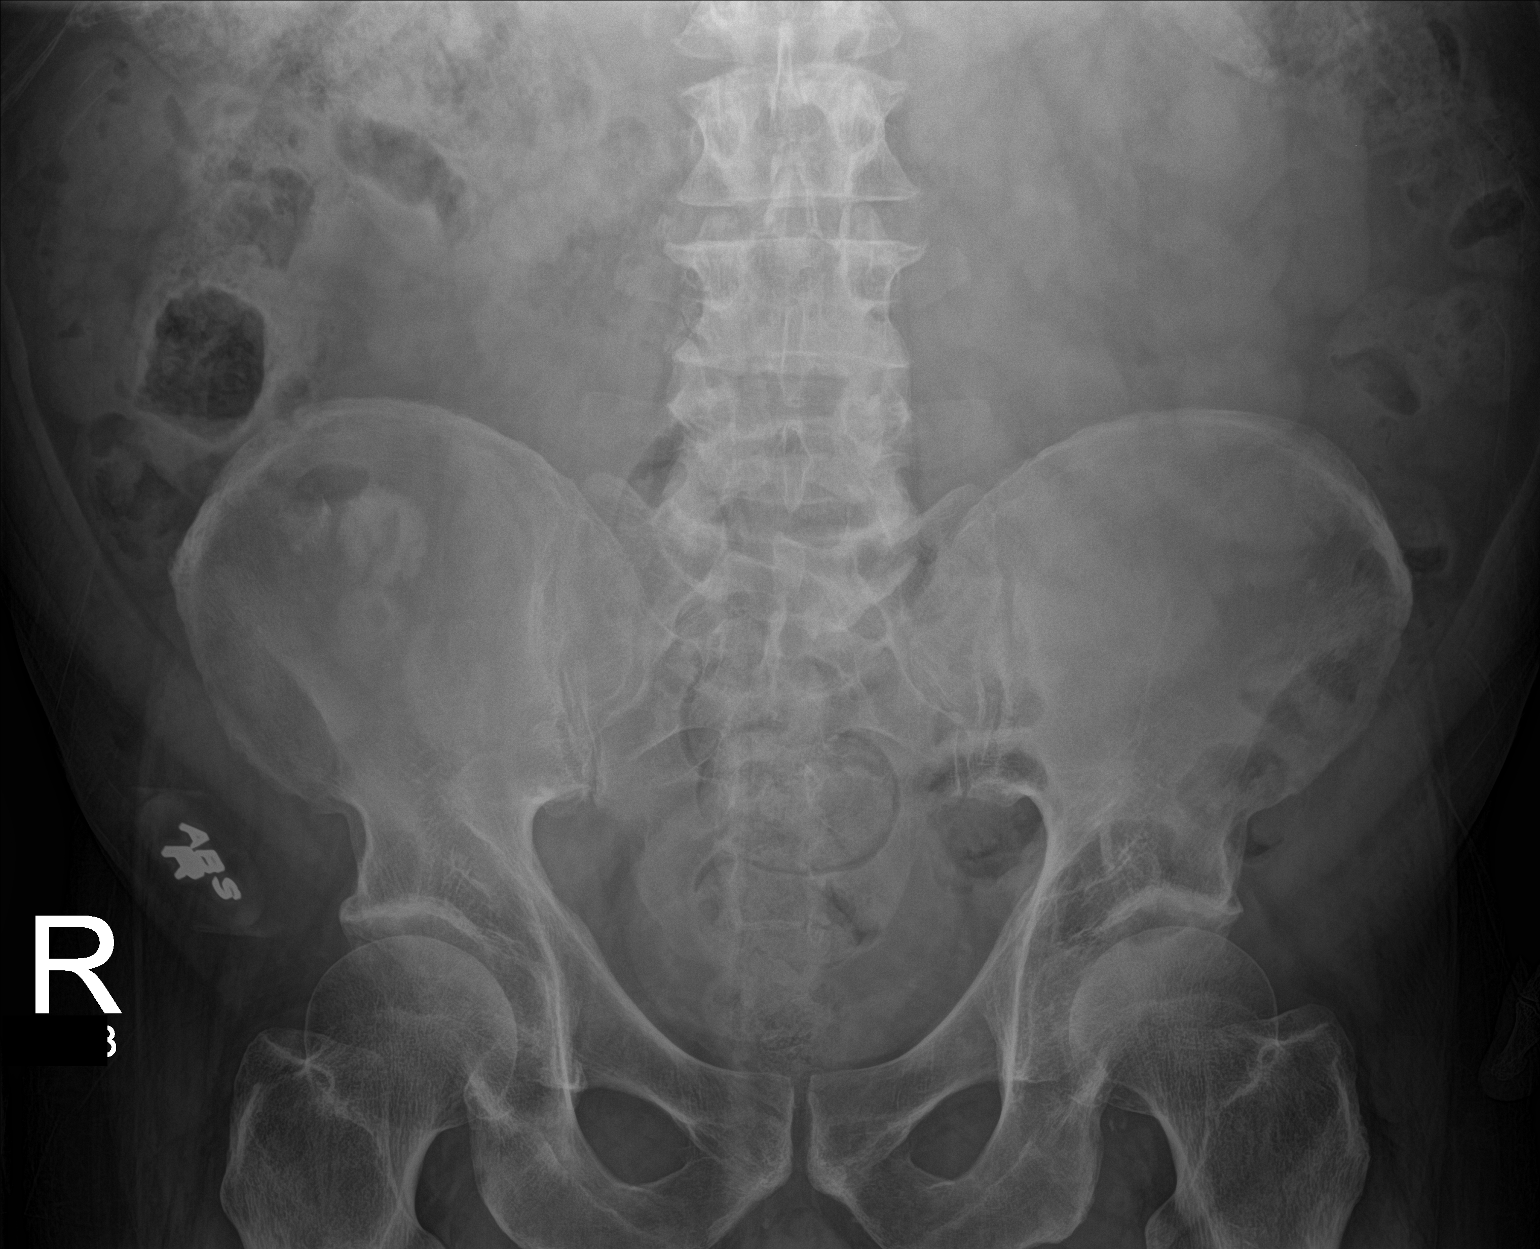

[3 of 3 positions shown; findings below may reference images not displayed]

FINDINGS: Scattered large and small bowel gas is noted. Fecal material is
noted throughout the colon. No free air is seen. No abnormal mass or
abnormal calcifications are noted. Degenerative change of the lumbar
spine is seen.
IMPRESSION: No acute abnormality noted.

## 2016-08-20 ENCOUNTER — Telehealth: Payer: Self-pay

## 2016-08-20 NOTE — Telephone Encounter (Signed)
[name] from patient's home came by office to pick up [name] samples. A month of [name] 50mg  was given she was told patient has not followed up with Dr. [name] and needs to make an apt before more samples can be given.

## 2016-09-18 ENCOUNTER — Encounter: Payer: Self-pay | Admitting: Urology

## 2016-09-18 ENCOUNTER — Ambulatory Visit: Payer: Self-pay | Admitting: Urology

## 2016-10-13 ENCOUNTER — Other Ambulatory Visit: Payer: Self-pay | Admitting: Specialist

## 2016-10-13 DIAGNOSIS — J849 Interstitial pulmonary disease, unspecified: Secondary | ICD-10-CM

## 2016-10-21 ENCOUNTER — Ambulatory Visit: Admission: RE | Admit: 2016-10-21 | Payer: Medicaid Other | Source: Ambulatory Visit

## 2016-11-06 ENCOUNTER — Encounter: Payer: Self-pay | Admitting: Urology

## 2016-11-06 ENCOUNTER — Ambulatory Visit: Payer: Self-pay | Admitting: Urology

## 2016-11-27 ENCOUNTER — Encounter: Payer: Self-pay | Admitting: Urology

## 2016-11-27 ENCOUNTER — Ambulatory Visit: Payer: Self-pay | Admitting: Urology

## 2017-04-17 ENCOUNTER — Emergency Department: Payer: Medicaid Other

## 2017-04-17 ENCOUNTER — Encounter: Payer: Self-pay | Admitting: Emergency Medicine

## 2017-04-17 ENCOUNTER — Emergency Department
Admission: EM | Admit: 2017-04-17 | Discharge: 2017-04-17 | Disposition: A | Discharge status: Home / Self Care | Payer: Medicaid Other | Attending: Emergency Medicine | Admitting: Emergency Medicine

## 2017-04-17 ENCOUNTER — Other Ambulatory Visit: Payer: Self-pay

## 2017-04-17 DIAGNOSIS — Y92410 Unspecified street and highway as the place of occurrence of the external cause: Secondary | ICD-10-CM | POA: Insufficient documentation

## 2017-04-17 DIAGNOSIS — J45909 Unspecified asthma, uncomplicated: Secondary | ICD-10-CM | POA: Insufficient documentation

## 2017-04-17 DIAGNOSIS — F419 Anxiety disorder, unspecified: Secondary | ICD-10-CM | POA: Insufficient documentation

## 2017-04-17 DIAGNOSIS — G44319 Acute post-traumatic headache, not intractable: Secondary | ICD-10-CM | POA: Insufficient documentation

## 2017-04-17 DIAGNOSIS — I10 Essential (primary) hypertension: Secondary | ICD-10-CM | POA: Insufficient documentation

## 2017-04-17 DIAGNOSIS — M542 Cervicalgia: Secondary | ICD-10-CM | POA: Diagnosis not present

## 2017-04-17 DIAGNOSIS — F17210 Nicotine dependence, cigarettes, uncomplicated: Secondary | ICD-10-CM | POA: Insufficient documentation

## 2017-04-17 DIAGNOSIS — S3991XA Unspecified injury of abdomen, initial encounter: Secondary | ICD-10-CM | POA: Diagnosis not present

## 2017-04-17 DIAGNOSIS — S0081XA Abrasion of other part of head, initial encounter: Secondary | ICD-10-CM | POA: Diagnosis not present

## 2017-04-17 DIAGNOSIS — F319 Bipolar disorder, unspecified: Secondary | ICD-10-CM | POA: Diagnosis not present

## 2017-04-17 DIAGNOSIS — Y998 Other external cause status: Secondary | ICD-10-CM | POA: Diagnosis not present

## 2017-04-17 DIAGNOSIS — I252 Old myocardial infarction: Secondary | ICD-10-CM | POA: Diagnosis not present

## 2017-04-17 DIAGNOSIS — Z7982 Long term (current) use of aspirin: Secondary | ICD-10-CM | POA: Insufficient documentation

## 2017-04-17 DIAGNOSIS — Z79899 Other long term (current) drug therapy: Secondary | ICD-10-CM | POA: Diagnosis not present

## 2017-04-17 DIAGNOSIS — S2232XA Fracture of one rib, left side, initial encounter for closed fracture: Secondary | ICD-10-CM | POA: Diagnosis not present

## 2017-04-17 DIAGNOSIS — Y9389 Activity, other specified: Secondary | ICD-10-CM | POA: Diagnosis not present

## 2017-04-17 LAB — COMPREHENSIVE METABOLIC PANEL
ALK PHOS: 65 U/L (ref 38–126)
ALT: 13 U/L — AB (ref 17–63)
ANION GAP: 12 (ref 5–15)
AST: 22 U/L (ref 15–41)
Albumin: 4 g/dL (ref 3.5–5.0)
BUN: 9 mg/dL (ref 6–20)
CALCIUM: 9.4 mg/dL (ref 8.9–10.3)
CO2: 30 mmol/L (ref 22–32)
CREATININE: 0.97 mg/dL (ref 0.61–1.24)
Chloride: 99 mmol/L — ABNORMAL LOW (ref 101–111)
Glucose, Bld: 99 mg/dL (ref 65–99)
Potassium: 4.2 mmol/L (ref 3.5–5.1)
Sodium: 141 mmol/L (ref 135–145)
Total Bilirubin: 0.6 mg/dL (ref 0.3–1.2)
Total Protein: 7.6 g/dL (ref 6.5–8.1)

## 2017-04-17 LAB — CBC WITH DIFFERENTIAL/PLATELET
Basophils Absolute: 0 10*3/uL (ref 0–0.1)
Basophils Relative: 0 %
EOS PCT: 2 %
Eosinophils Absolute: 0.1 10*3/uL (ref 0–0.7)
HCT: 46.1 % (ref 40.0–52.0)
Hemoglobin: 15.2 g/dL (ref 13.0–18.0)
LYMPHS ABS: 0.9 10*3/uL — AB (ref 1.0–3.6)
LYMPHS PCT: 10 %
MCH: 29 pg (ref 26.0–34.0)
MCHC: 32.9 g/dL (ref 32.0–36.0)
MCV: 88 fL (ref 80.0–100.0)
MONO ABS: 1.1 10*3/uL — AB (ref 0.2–1.0)
MONOS PCT: 12 %
Neutro Abs: 7.1 10*3/uL — ABNORMAL HIGH (ref 1.4–6.5)
Neutrophils Relative %: 76 %
Platelets: 155 10*3/uL (ref 150–440)
RBC: 5.24 MIL/uL (ref 4.40–5.90)
RDW: 16.6 % — ABNORMAL HIGH (ref 11.5–14.5)
WBC: 9.2 10*3/uL (ref 3.8–10.6)

## 2017-04-17 LAB — TROPONIN I

## 2017-04-17 MED ORDER — NAPROXEN 500 MG PO TABS: 500.0000 mg | ORAL_TABLET | Freq: Two times a day (BID) | ORAL | 0 refills | Status: DC

## 2017-04-17 MED ORDER — KETOROLAC TROMETHAMINE 30 MG/ML IJ SOLN
30.0000 mg | Freq: Once | INTRAMUSCULAR | Status: AC
  Administered 2017-04-17: 30 mg via INTRAVENOUS
  Filled 2017-04-17: qty 1

## 2017-04-17 MED ORDER — IOPAMIDOL (ISOVUE-300) INJECTION 61%
100.0000 mL | Freq: Once | INTRAVENOUS | Status: AC | PRN
  Administered 2017-04-17: 100 mL via INTRAVENOUS
  Filled 2017-04-17: qty 100
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
## 2017-05-26 ENCOUNTER — Emergency Department: Payer: Medicaid Other

## 2017-05-26 ENCOUNTER — Encounter: Payer: Self-pay | Admitting: Emergency Medicine

## 2017-05-26 ENCOUNTER — Other Ambulatory Visit: Payer: Self-pay

## 2017-05-26 ENCOUNTER — Emergency Department
Admission: EM | Admit: 2017-05-26 | Discharge: 2017-05-26 | Disposition: A | Discharge status: Home / Self Care | Payer: Medicaid Other | Attending: Emergency Medicine | Admitting: Emergency Medicine

## 2017-05-26 DIAGNOSIS — J45909 Unspecified asthma, uncomplicated: Secondary | ICD-10-CM | POA: Insufficient documentation

## 2017-05-26 DIAGNOSIS — F17210 Nicotine dependence, cigarettes, uncomplicated: Secondary | ICD-10-CM | POA: Insufficient documentation

## 2017-05-26 DIAGNOSIS — Y92410 Unspecified street and highway as the place of occurrence of the external cause: Secondary | ICD-10-CM | POA: Diagnosis not present

## 2017-05-26 DIAGNOSIS — Z7982 Long term (current) use of aspirin: Secondary | ICD-10-CM | POA: Insufficient documentation

## 2017-05-26 DIAGNOSIS — S2242XA Multiple fractures of ribs, left side, initial encounter for closed fracture: Secondary | ICD-10-CM | POA: Insufficient documentation

## 2017-05-26 DIAGNOSIS — Z79899 Other long term (current) drug therapy: Secondary | ICD-10-CM | POA: Diagnosis not present

## 2017-05-26 DIAGNOSIS — I10 Essential (primary) hypertension: Secondary | ICD-10-CM | POA: Diagnosis not present

## 2017-05-26 DIAGNOSIS — Y999 Unspecified external cause status: Secondary | ICD-10-CM | POA: Insufficient documentation

## 2017-05-26 DIAGNOSIS — I252 Old myocardial infarction: Secondary | ICD-10-CM | POA: Insufficient documentation

## 2017-05-26 DIAGNOSIS — Y939 Activity, unspecified: Secondary | ICD-10-CM | POA: Insufficient documentation

## 2017-05-26 DIAGNOSIS — S20302A Unspecified superficial injuries of left front wall of thorax, initial encounter: Secondary | ICD-10-CM | POA: Diagnosis present

## 2017-05-26 LAB — COMPREHENSIVE METABOLIC PANEL
ALT: 14 U/L — AB (ref 17–63)
AST: 24 U/L (ref 15–41)
Albumin: 4.2 g/dL (ref 3.5–5.0)
Alkaline Phosphatase: 97 U/L (ref 38–126)
Anion gap: 9 (ref 5–15)
BUN: 10 mg/dL (ref 6–20)
CHLORIDE: 96 mmol/L — AB (ref 101–111)
CO2: 32 mmol/L (ref 22–32)
CREATININE: 0.95 mg/dL (ref 0.61–1.24)
Calcium: 9.5 mg/dL (ref 8.9–10.3)
GFR calc Af Amer: >60 mL/min (ref >60)
GFR calc non Af Amer: >60 mL/min (ref >60)
Glucose, Bld: 109 mg/dL — ABNORMAL HIGH (ref 65–99)
Potassium: 3.9 mmol/L (ref 3.5–5.1)
Sodium: 137 mmol/L (ref 135–145)
Total Bilirubin: 0.7 mg/dL (ref 0.3–1.2)
Total Protein: 7.7 g/dL (ref 6.5–8.1)

## 2017-05-26 LAB — CBC
HCT: 48.2 % (ref 40.0–52.0)
HEMOGLOBIN: 16 g/dL (ref 13.0–18.0)
MCH: 29.5 pg (ref 26.0–34.0)
MCHC: 33.1 g/dL (ref 32.0–36.0)
MCV: 89.2 fL (ref 80.0–100.0)
PLATELETS: 172 10*3/uL (ref 150–440)
RBC: 5.4 MIL/uL (ref 4.40–5.90)
RDW: 16.2 % — ABNORMAL HIGH (ref 11.5–14.5)
WBC: 8.1 10*3/uL (ref 3.8–10.6)

## 2017-05-26 MED ORDER — KETOROLAC TROMETHAMINE 30 MG/ML IJ SOLN
30.0000 mg | Freq: Once | INTRAMUSCULAR | Status: AC
  Administered 2017-05-26: 30 mg via INTRAVENOUS

## 2017-05-26 MED ORDER — HYDROCODONE-ACETAMINOPHEN 5-325 MG PO TABS: 1.0000 | ORAL_TABLET | ORAL | 0 refills | Status: AC | PRN

## 2017-05-26 MED ORDER — KETOROLAC TROMETHAMINE 30 MG/ML IJ SOLN: 30.0000 mg | Freq: Once | INTRAMUSCULAR | Status: DC

## 2017-05-26 MED ORDER — KETOROLAC TROMETHAMINE 30 MG/ML IJ SOLN
INTRAMUSCULAR | Status: AC
  Filled 2017-05-26: qty 1

## 2017-05-26 MED ORDER — NAPROXEN 500 MG PO TABS: 500.0000 mg | ORAL_TABLET | Freq: Two times a day (BID) | ORAL | 2 refills | Status: AC

## 2017-05-26 NOTE — ED Notes (Signed)
Patient transported to CT 

## 2017-05-26 NOTE — ED Notes (Addendum)
Patient back in room s/p CT.

## 2017-05-26 NOTE — ED Notes (Signed)
C-collar removed. CT results negative for acute injury. MD aware.
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
## 2017-05-26 NOTE — ED Notes (Signed)
Pt ambulatory to wheel chair upon discharge. Verbalized understanding of discharge instructions, follow-up care, prescription and incentive spirometer use. This RN instructed on use of incentive spirometer and pt performed teachback and demonstrated x5 times prior to discharge. VSS. A&Ox4.

## 2017-05-26 NOTE — ED Notes (Signed)
Pt requested 1L [place] O2 for comfort. Pt states he wears 1L [place] at home chronically.
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
## 2017-06-22 ENCOUNTER — Encounter: Payer: Self-pay | Admitting: *Deleted

## 2017-06-30 ENCOUNTER — Encounter
Admission: RE | Disposition: A | Discharge status: Home / Self Care | Payer: Self-pay | Source: Ambulatory Visit | Attending: Ophthalmology

## 2017-06-30 ENCOUNTER — Ambulatory Visit: Payer: Medicaid Other | Admitting: Anesthesiology

## 2017-06-30 ENCOUNTER — Other Ambulatory Visit: Payer: Self-pay

## 2017-06-30 ENCOUNTER — Encounter: Payer: Self-pay | Admitting: *Deleted

## 2017-06-30 ENCOUNTER — Ambulatory Visit
Admission: RE | Admit: 2017-06-30 | Discharge: 2017-06-30 | Disposition: A | Discharge status: Home / Self Care | Payer: Medicaid Other | Source: Ambulatory Visit | Attending: Ophthalmology | Admitting: Ophthalmology

## 2017-06-30 DIAGNOSIS — I252 Old myocardial infarction: Secondary | ICD-10-CM | POA: Insufficient documentation

## 2017-06-30 DIAGNOSIS — M1990 Unspecified osteoarthritis, unspecified site: Secondary | ICD-10-CM | POA: Diagnosis not present

## 2017-06-30 DIAGNOSIS — I10 Essential (primary) hypertension: Secondary | ICD-10-CM | POA: Diagnosis not present

## 2017-06-30 DIAGNOSIS — K219 Gastro-esophageal reflux disease without esophagitis: Secondary | ICD-10-CM | POA: Insufficient documentation

## 2017-06-30 DIAGNOSIS — I2510 Atherosclerotic heart disease of native coronary artery without angina pectoris: Secondary | ICD-10-CM | POA: Insufficient documentation

## 2017-06-30 DIAGNOSIS — F419 Anxiety disorder, unspecified: Secondary | ICD-10-CM | POA: Insufficient documentation

## 2017-06-30 DIAGNOSIS — H2511 Age-related nuclear cataract, right eye: Secondary | ICD-10-CM | POA: Diagnosis not present

## 2017-06-30 DIAGNOSIS — J449 Chronic obstructive pulmonary disease, unspecified: Secondary | ICD-10-CM | POA: Diagnosis not present

## 2017-06-30 DIAGNOSIS — Z87891 Personal history of nicotine dependence: Secondary | ICD-10-CM | POA: Insufficient documentation

## 2017-06-30 DIAGNOSIS — E7800 Pure hypercholesterolemia, unspecified: Secondary | ICD-10-CM | POA: Insufficient documentation

## 2017-06-30 DIAGNOSIS — R011 Cardiac murmur, unspecified: Secondary | ICD-10-CM | POA: Diagnosis not present

## 2017-06-30 DIAGNOSIS — Z8673 Personal history of transient ischemic attack (TIA), and cerebral infarction without residual deficits: Secondary | ICD-10-CM | POA: Diagnosis not present

## 2017-06-30 DIAGNOSIS — Z7982 Long term (current) use of aspirin: Secondary | ICD-10-CM | POA: Insufficient documentation

## 2017-06-30 DIAGNOSIS — Z9981 Dependence on supplemental oxygen: Secondary | ICD-10-CM | POA: Insufficient documentation

## 2017-06-30 DIAGNOSIS — R251 Tremor, unspecified: Secondary | ICD-10-CM | POA: Insufficient documentation

## 2017-06-30 DIAGNOSIS — Z79899 Other long term (current) drug therapy: Secondary | ICD-10-CM | POA: Insufficient documentation

## 2017-06-30 DIAGNOSIS — F319 Bipolar disorder, unspecified: Secondary | ICD-10-CM | POA: Insufficient documentation

## 2017-06-30 HISTORY — DX: Angina pectoris, unspecified: I20.9

## 2017-06-30 HISTORY — DX: Depression, unspecified: F32.A

## 2017-06-30 HISTORY — DX: Acute myocardial infarction, unspecified: I21.9

## 2017-06-30 HISTORY — DX: Major depressive disorder, single episode, unspecified: F32.9

## 2017-06-30 HISTORY — DX: Dizziness and giddiness: R42

## 2017-06-30 HISTORY — DX: Orthopnea: R06.01

## 2017-06-30 HISTORY — DX: Cerebral infarction, unspecified: I63.9

## 2017-06-30 HISTORY — DX: Cardiac murmur, unspecified: R01.1

## 2017-06-30 HISTORY — DX: Atherosclerotic heart disease of native coronary artery without angina pectoris: I25.10

## 2017-06-30 HISTORY — DX: Anemia, unspecified: D64.9

## 2017-06-30 HISTORY — DX: Chronic obstructive pulmonary disease, unspecified: J44.9

## 2017-06-30 HISTORY — PX: CATARACT EXTRACTION W/PHACO: SHX586

## 2017-06-30 HISTORY — DX: Palpitations: R00.2

## 2017-06-30 HISTORY — DX: Unspecified osteoarthritis, unspecified site: M19.90

## 2017-06-30 HISTORY — DX: Cardiac arrhythmia, unspecified: I49.9

## 2017-06-30 HISTORY — DX: Edema, unspecified: R60.9

## 2017-06-30 HISTORY — DX: Wheezing: R06.2

## 2017-06-30 HISTORY — DX: Dyspnea, unspecified: R06.00

## 2017-06-30 HISTORY — DX: Unspecified convulsions: R56.9

## 2017-06-30 SURGERY — PHACOEMULSIFICATION, CATARACT, WITH IOL INSERTION
Anesthesia: Monitor Anesthesia Care | Site: Eye | Laterality: Right | Wound class: "Clean "

## 2017-06-30 MED ORDER — METOPROLOL TARTRATE 50 MG PO TABS
50.0000 mg | ORAL_TABLET | Freq: Once | ORAL | Status: AC
  Administered 2017-06-30: 50 mg via ORAL

## 2017-06-30 MED ORDER — CARBACHOL 0.01 % IO SOLN
INTRAOCULAR | Status: DC | PRN
  Administered 2017-06-30: .5 mL via INTRAOCULAR

## 2017-06-30 MED ORDER — TRYPAN BLUE 0.06 % OP SOLN
OPHTHALMIC | Status: DC | PRN
  Administered 2017-06-30: .5 mL via INTRAOCULAR

## 2017-06-30 MED ORDER — LIDOCAINE HCL (PF) 4 % IJ SOLN
INTRAMUSCULAR | Status: AC
  Filled 2017-06-30: qty 5

## 2017-06-30 MED ORDER — ARMC OPHTHALMIC DILATING DROPS
OPHTHALMIC | Status: AC
  Filled 2017-06-30: qty 0.4

## 2017-06-30 MED ORDER — NA CHONDROIT SULF-NA HYALURON 40-17 MG/ML IO SOLN
INTRAOCULAR | Status: AC
  Filled 2017-06-30: qty 1

## 2017-06-30 MED ORDER — EPINEPHRINE PF 1 MG/ML IJ SOLN
INTRAMUSCULAR | Status: AC
  Filled 2017-06-30: qty 1

## 2017-06-30 MED ORDER — LIDOCAINE HCL (PF) 4 % IJ SOLN
INTRAOCULAR | Status: DC | PRN
  Administered 2017-06-30: 2 mL via OPHTHALMIC

## 2017-06-30 MED ORDER — ARMC OPHTHALMIC DILATING DROPS
OPHTHALMIC | Status: AC
  Administered 2017-06-30: 1 via OPHTHALMIC
  Filled 2017-06-30: qty 0.4

## 2017-06-30 MED ORDER — NA CHONDROIT SULF-NA HYALURON 40-17 MG/ML IO SOLN
INTRAOCULAR | Status: DC | PRN
  Administered 2017-06-30: 1 mL via INTRAOCULAR

## 2017-06-30 MED ORDER — MIDAZOLAM HCL 2 MG/2ML IJ SOLN
INTRAMUSCULAR | Status: AC
  Filled 2017-06-30: qty 2

## 2017-06-30 MED ORDER — POVIDONE-IODINE 5 % OP SOLN
OPHTHALMIC | Status: DC | PRN
  Administered 2017-06-30: 1 via OPHTHALMIC

## 2017-06-30 MED ORDER — MOXIFLOXACIN HCL 0.5 % OP SOLN
OPHTHALMIC | Status: DC | PRN
  Administered 2017-06-30: .2 mL via OPHTHALMIC

## 2017-06-30 MED ORDER — SODIUM CHLORIDE 0.9 % IV SOLN
INTRAVENOUS | Status: DC
  Administered 2017-06-30: 50 mL/h via INTRAVENOUS

## 2017-06-30 MED ORDER — METOPROLOL TARTRATE 50 MG PO TABS
ORAL_TABLET | ORAL | Status: AC
  Administered 2017-06-30: 50 mg via ORAL
  Filled 2017-06-30: qty 1

## 2017-06-30 MED ORDER — FENTANYL CITRATE (PF) 100 MCG/2ML IJ SOLN
INTRAMUSCULAR | Status: AC
  Filled 2017-06-30: qty 2

## 2017-06-30 MED ORDER — ARMC OPHTHALMIC DILATING DROPS
1.0000 "application " | OPHTHALMIC | Status: AC
  Administered 2017-06-30 (×3): 1 via OPHTHALMIC

## 2017-06-30 MED ORDER — POVIDONE-IODINE 5 % OP SOLN
OPHTHALMIC | Status: AC
  Filled 2017-06-30: qty 30

## 2017-06-30 MED ORDER — MOXIFLOXACIN HCL 0.5 % OP SOLN
OPHTHALMIC | Status: AC
  Filled 2017-06-30: qty 3

## 2017-06-30 MED ORDER — MIDAZOLAM HCL 2 MG/2ML IJ SOLN
INTRAMUSCULAR | Status: DC | PRN
  Administered 2017-06-30: 2 mg via INTRAVENOUS

## 2017-06-30 MED ORDER — MOXIFLOXACIN HCL 0.5 % OP SOLN: 1.0000 [drp] | OPHTHALMIC | Status: DC | PRN

## 2017-06-30 MED ORDER — EPINEPHRINE PF 1 MG/ML IJ SOLN
INTRAOCULAR | Status: DC | PRN
  Administered 2017-06-30: 1 mL via OPHTHALMIC

## 2017-06-30 SURGICAL SUPPLY — 21 items
CANNULA ANT/CHMB 27G (MISCELLANEOUS) IMPLANT
CANNULA ANT/CHMB 27GA (MISCELLANEOUS) ×6 IMPLANT
FILTER MILLEX .045 (MISCELLANEOUS) IMPLANT
FLTR MILLEX .045 (MISCELLANEOUS) ×3
GLOVE BIO SURGEON STRL SZ8 (GLOVE) ×3 IMPLANT
GLOVE BIOGEL M 6.5 STRL (GLOVE) ×3 IMPLANT
GLOVE SURG LX 8.0 MICRO (GLOVE) ×2
GLOVE SURG LX STRL 8.0 MICRO (GLOVE) ×1 IMPLANT
GOWN STRL REUS W/ TWL LRG LVL3 (GOWN DISPOSABLE) ×2 IMPLANT
GOWN STRL REUS W/TWL LRG LVL3 (GOWN DISPOSABLE) ×4
LABEL CATARACT MEDS ST (LABEL) ×3 IMPLANT
LENS IOL TECNIS ITEC 21.5 (Intraocular Lens) ×2 IMPLANT
PACK CATARACT (MISCELLANEOUS) ×3 IMPLANT
PACK CATARACT BRASINGTON LX (MISCELLANEOUS) ×3 IMPLANT
PACK EYE AFTER SURG (MISCELLANEOUS) ×3 IMPLANT
SOL BSS BAG (MISCELLANEOUS) ×3
SOLUTION BSS BAG (MISCELLANEOUS) ×1 IMPLANT
SYR 3ML LL SCALE MARK (SYRINGE) ×2 IMPLANT
SYR 5ML LL (SYRINGE) ×3 IMPLANT
WATER STERILE IRR 250ML POUR (IV SOLUTION) ×3 IMPLANT
WIPE NON LINTING 3.25X3.25 (MISCELLANEOUS) ×3 IMPLANT

## 2017-06-30 NOTE — Anesthesia Postprocedure Evaluation (Signed)
Anesthesia Post Note  Patient: [name]  Procedure(s) Performed: CATARACT EXTRACTION PHACO AND INTRAOCULAR LENS PLACEMENT (IOC) (Right Eye)  Patient location during evaluation: PACU Anesthesia Type: MAC Level of consciousness: awake and alert Pain management: pain level controlled Vital Signs Assessment: post-procedure vital signs reviewed and stable Respiratory status: spontaneous breathing, nonlabored ventilation, respiratory function stable and patient connected to nasal cannula oxygen Cardiovascular status: stable and blood pressure returned to baseline Postop Assessment: no apparent nausea or vomiting Anesthetic complications: no     Last Vitals:  Vitals:   06/30/17 1011  BP: (!) 135/98  Pulse: (!) 110  Resp: 16  Temp: 36.6 C  SpO2: 95%    Last Pain:  Vitals:   06/30/17 1011  TempSrc: Temporal  PainSc: 0-No pain                 Estill Batten

## 2017-06-30 NOTE — Discharge Instructions (Signed)
Eye Surgery Discharge Instructions  Expect mild scratchy sensation or mild soreness. DO NOT RUB YOUR EYE!  The day of surgery:  Minimal physical activity, but bed rest is not required  No reading, computer work, or close hand work  No bending, lifting, or straining.  May watch TV  For 24 hours:  No driving, legal decisions, or alcoholic beverages  Safety precautions  Eat anything you prefer: It is better to start with liquids, then soup then solid foods.  _____ Eye patch should be worn until postoperative exam tomorrow.  ____ Solar shield eyeglasses should be worn for comfort in the sunlight/patch while sleeping  Resume all regular medications including aspirin or Coumadin if these were discontinued prior to surgery. You may shower, bathe, shave, or wash your hair. Tylenol may be taken for mild discomfort.  Call your doctor if you experience significant pain, nausea, or vomiting, fever > 101 or other signs of infection. [phone] or [phone] Specific instructions:  Follow-up [id number]    Galen ManilaPorfilio, William, MD Follow up.   Specialty:  Ophthalmology Why:  April 17 at 1:25pm Contact information: [address] [place] [address] [phone]

## 2017-06-30 NOTE — Op Note (Signed)
PREOPERATIVE DIAGNOSIS:  Nuclear sclerotic cataract of the right eye.   POSTOPERATIVE DIAGNOSIS:  nuclear sclerotic cataract right eye   OPERATIVE PROCEDURE: Procedure(s): CATARACT EXTRACTION PHACO AND INTRAOCULAR LENS PLACEMENT (IOC)   SURGEON:  Galen ManilaWilliam Muskan Bolla, MD.   ANESTHESIA:  Anesthesiologist: Alver FisherPenwarden, Amy, MD CRNA: Irving BurtonBachich, Jennifer, CRNA  1.      Managed anesthesia care. 2.      0.581ml of Shugarcaine was instilled in the eye following the paracentesis.   COMPLICATIONS:  None.   TECHNIQUE:   Stop and chop   DESCRIPTION OF PROCEDURE:  The patient was examined and consented in the preoperative holding area where the aforementioned topical anesthesia was applied to the right eye and then brought back to the Operating Room where the right eye was prepped and draped in the usual sterile ophthalmic fashion and a lid speculum was placed. A paracentesis was created with the side port blade and the anterior chamber was filled with viscoelastic. A near clear corneal incision was performed with the steel keratome. A continuous curvilinear capsulorrhexis was performed with a cystotome followed by the capsulorrhexis forceps. Hydrodissection and hydrodelineation were carried out with BSS on a blunt cannula. The lens was removed in a stop and chop  technique and the remaining cortical material was removed with the irrigation-aspiration handpiece. The capsular bag was inflated with viscoelastic and the Technis ZCB00  lens was placed in the capsular bag without complication. The remaining viscoelastic was removed from the eye with the irrigation-aspiration handpiece. The wounds were hydrated. The anterior chamber was flushed with Miostat and the eye was inflated to physiologic pressure. 0.131ml of Vigamox was placed in the anterior chamber. The wounds were found to be water tight. The eye was dressed with Vigamox. The patient was given protective glasses to wear throughout the day and a shield with which  to sleep tonight. The patient was also given drops with which to begin a drop regimen today and will follow-up with me in one day. Implant Name Type Inv. Item Serial No. Manufacturer Lot No. LRB No. Used  LENS IOL DIOP 21.5 - [id number] Intraocular Lens LENS IOL DIOP 21.5 [id number]  Right 1   Procedure(s) with comments: CATARACT EXTRACTION PHACO AND INTRAOCULAR LENS PLACEMENT (IOC) (Right) - US 01:11 AP% 20.1 CDE 14.42 Fluid pack lot # [id number]  Electronically signed: Galen ManilaWilliam Claudie Brickhouse 06/30/2017 11:16 AM

## 2017-06-30 NOTE — Transfer of Care (Signed)
Immediate Anesthesia Transfer of Care Note  Patient: [name]  Procedure(s) Performed: CATARACT EXTRACTION PHACO AND INTRAOCULAR LENS PLACEMENT (IOC) (Right Eye)  Patient Location: Short Stay  Anesthesia Type:MAC  Level of Consciousness: awake, alert  and oriented  Airway & Oxygen Therapy: Patient Spontanous Breathing  Post-op Assessment: Post -op Vital signs reviewed and stable  Post vital signs: stable  Last Vitals:  Vitals Value Taken Time  BP    Temp    Pulse    Resp    SpO2      Last Pain:  Vitals:   06/30/17 1011  TempSrc: Temporal  PainSc: 0-No pain         Complications: No apparent anesthesia complications

## 2017-06-30 NOTE — Anesthesia Post-op Follow-up Note (Signed)
Anesthesia QCDR form completed.        

## 2017-06-30 NOTE — H&P (Signed)
All labs reviewed. Abnormal studies sent to patients PCP when indicated.  Previous H&P reviewed, patient examined, there are NO CHANGES.  [name] Porfilio4/16/201910:48 AM

## 2017-06-30 NOTE — Anesthesia Preprocedure Evaluation (Signed)
Anesthesia Evaluation  Patient identified by MRN, date of birth, ID band Patient awake    Reviewed: Allergy & Precautions, NPO status , Patient's Chart, lab work & pertinent test results  History of Anesthesia Complications Negative for: history of anesthetic complications  Airway Mallampati: III  TM Distance: >3 FB Neck ROM: Full    Dental  (+) Poor Dentition   Pulmonary asthma , COPD,  oxygen dependent, Current Smoker,    breath sounds clear to auscultation- rhonchi (-) wheezing      Cardiovascular hypertension, + CAD and + Past MI  (-) Cardiac Stents and (-) CABG  Rhythm:Regular Rate:Normal - Systolic murmurs and - Diastolic murmurs    Neuro/Psych Seizures -,  PSYCHIATRIC DISORDERS Anxiety Depression Bipolar Disorder CVA, No Residual Symptoms    GI/Hepatic Neg liver ROS, GERD  ,  Endo/Other  negative endocrine ROSneg diabetes  Renal/GU negative Renal ROS     Musculoskeletal  (+) Arthritis ,   Abdominal (+) - obese,   Peds  Hematology  (+) anemia ,   Anesthesia Other Findings Past Medical History: No date: Anemia No date: Anginal pain ([place]) No date: Anxiety No date: Arthritis No date: Asthma No date: Bipolar disorder (HCC) No date: Chronic pain No date: COPD (chronic obstructive pulmonary disease) (HCC) No date: Coronary artery disease No date: Depression No date: Dizziness No date: Dyspnea No date: Dysrhythmia No date: Edema No date: Esophageal stricture No date: GERD (gastroesophageal reflux disease) No date: H/O tonic-clonic seizures No date: Heart murmur No date: Hypertension No date: Myocardial infarction [facility])     Comment:  2017 No date: Orthopnea No date: Palpitations No date: Polysubstance abuse (HCC) No date: Seizures ([place]) No date: Stroke [facility])     Comment:  TIA 4/17 No date: Wheezing   Reproductive/Obstetrics                             Anesthesia  Physical Anesthesia Plan  ASA: III  Anesthesia Plan: MAC   Post-op Pain Management:    Induction: Intravenous  PONV Risk Score and Plan: 0 and Midazolam  Airway Management Planned: Natural Airway  Additional Equipment:   Intra-op Plan:   Post-operative Plan:   Informed Consent: I have reviewed the patients History and Physical, chart, labs and discussed the procedure including the risks, benefits and alternatives for the proposed anesthesia with the patient or authorized representative who has indicated his/her understanding and acceptance.     Plan Discussed with: CRNA and Anesthesiologist  Anesthesia Plan Comments:         Anesthesia Quick Evaluation

## 2017-06-30 NOTE — Progress Notes (Signed)
Pt. Has not taken any of his medicine in 3-4 days , Dr. Priscella MannPenwarden notified Metoprolol 50 mg po ordered.

## 2017-07-20 ENCOUNTER — Encounter: Payer: Self-pay | Admitting: *Deleted

## 2017-07-28 ENCOUNTER — Encounter
Admission: RE | Disposition: A | Discharge status: Home / Self Care | Payer: Self-pay | Source: Ambulatory Visit | Attending: Ophthalmology

## 2017-07-28 ENCOUNTER — Ambulatory Visit
Admission: RE | Admit: 2017-07-28 | Discharge: 2017-07-28 | Disposition: A | Discharge status: Home / Self Care | Payer: Medicaid Other | Source: Ambulatory Visit | Attending: Ophthalmology | Admitting: Ophthalmology

## 2017-07-28 ENCOUNTER — Ambulatory Visit: Payer: Medicaid Other | Admitting: Certified Registered Nurse Anesthetist

## 2017-07-28 DIAGNOSIS — E7800 Pure hypercholesterolemia, unspecified: Secondary | ICD-10-CM | POA: Insufficient documentation

## 2017-07-28 DIAGNOSIS — J449 Chronic obstructive pulmonary disease, unspecified: Secondary | ICD-10-CM | POA: Insufficient documentation

## 2017-07-28 DIAGNOSIS — F419 Anxiety disorder, unspecified: Secondary | ICD-10-CM | POA: Diagnosis not present

## 2017-07-28 DIAGNOSIS — Z8673 Personal history of transient ischemic attack (TIA), and cerebral infarction without residual deficits: Secondary | ICD-10-CM | POA: Insufficient documentation

## 2017-07-28 DIAGNOSIS — F329 Major depressive disorder, single episode, unspecified: Secondary | ICD-10-CM | POA: Diagnosis not present

## 2017-07-28 DIAGNOSIS — Z79899 Other long term (current) drug therapy: Secondary | ICD-10-CM | POA: Insufficient documentation

## 2017-07-28 DIAGNOSIS — H2512 Age-related nuclear cataract, left eye: Secondary | ICD-10-CM | POA: Diagnosis present

## 2017-07-28 DIAGNOSIS — F17200 Nicotine dependence, unspecified, uncomplicated: Secondary | ICD-10-CM | POA: Insufficient documentation

## 2017-07-28 DIAGNOSIS — R251 Tremor, unspecified: Secondary | ICD-10-CM | POA: Insufficient documentation

## 2017-07-28 DIAGNOSIS — I10 Essential (primary) hypertension: Secondary | ICD-10-CM | POA: Diagnosis not present

## 2017-07-28 DIAGNOSIS — K219 Gastro-esophageal reflux disease without esophagitis: Secondary | ICD-10-CM | POA: Diagnosis not present

## 2017-07-28 HISTORY — DX: Hypoxemia: R09.02

## 2017-07-28 HISTORY — PX: CATARACT EXTRACTION W/PHACO: SHX586

## 2017-07-28 SURGERY — PHACOEMULSIFICATION, CATARACT, WITH IOL INSERTION
Anesthesia: Monitor Anesthesia Care | Site: Eye | Laterality: Left | Wound class: "Clean "

## 2017-07-28 MED ORDER — MIDAZOLAM HCL 2 MG/2ML IJ SOLN
INTRAMUSCULAR | Status: DC | PRN
  Administered 2017-07-28: 1 mg via INTRAVENOUS

## 2017-07-28 MED ORDER — ARMC OPHTHALMIC DILATING DROPS
1.0000 "application " | OPHTHALMIC | Status: AC
  Administered 2017-07-28 (×3): 1 via OPHTHALMIC

## 2017-07-28 MED ORDER — CARBACHOL 0.01 % IO SOLN
INTRAOCULAR | Status: DC | PRN
  Administered 2017-07-28: 0.5 mL via INTRAOCULAR

## 2017-07-28 MED ORDER — LIDOCAINE HCL (PF) 4 % IJ SOLN
INTRAOCULAR | Status: DC | PRN
  Administered 2017-07-28: 4 mL via OPHTHALMIC

## 2017-07-28 MED ORDER — NA CHONDROIT SULF-NA HYALURON 40-17 MG/ML IO SOLN
INTRAOCULAR | Status: DC | PRN
  Administered 2017-07-28: 1 mL via INTRAOCULAR

## 2017-07-28 MED ORDER — FENTANYL CITRATE (PF) 100 MCG/2ML IJ SOLN
INTRAMUSCULAR | Status: DC | PRN
  Administered 2017-07-28: 25 ug via INTRAVENOUS

## 2017-07-28 MED ORDER — TRYPAN BLUE 0.06 % OP SOLN
OPHTHALMIC | Status: AC
  Filled 2017-07-28: qty 0.5

## 2017-07-28 MED ORDER — MIDAZOLAM HCL 2 MG/2ML IJ SOLN
INTRAMUSCULAR | Status: AC
  Filled 2017-07-28: qty 2

## 2017-07-28 MED ORDER — TRYPAN BLUE 0.06 % OP SOLN
OPHTHALMIC | Status: DC | PRN
  Administered 2017-07-28: 0.5 mL via INTRAOCULAR

## 2017-07-28 MED ORDER — MOXIFLOXACIN HCL 0.5 % OP SOLN
OPHTHALMIC | Status: AC
  Filled 2017-07-28: qty 3

## 2017-07-28 MED ORDER — EPINEPHRINE PF 1 MG/ML IJ SOLN
INTRAMUSCULAR | Status: AC
  Filled 2017-07-28: qty 2

## 2017-07-28 MED ORDER — FENTANYL CITRATE (PF) 100 MCG/2ML IJ SOLN
INTRAMUSCULAR | Status: AC
  Filled 2017-07-28: qty 2

## 2017-07-28 MED ORDER — EPINEPHRINE PF 1 MG/ML IJ SOLN
INTRAOCULAR | Status: DC | PRN
  Administered 2017-07-28: 12:00:00 via OPHTHALMIC

## 2017-07-28 MED ORDER — MOXIFLOXACIN HCL 0.5 % OP SOLN
OPHTHALMIC | Status: DC | PRN
  Administered 2017-07-28: 0.2 mL via OPHTHALMIC

## 2017-07-28 MED ORDER — POVIDONE-IODINE 5 % OP SOLN
OPHTHALMIC | Status: AC
  Filled 2017-07-28: qty 30

## 2017-07-28 MED ORDER — ARMC OPHTHALMIC DILATING DROPS
OPHTHALMIC | Status: AC
  Administered 2017-07-28: 1 via OPHTHALMIC
  Filled 2017-07-28: qty 0.4

## 2017-07-28 MED ORDER — LIDOCAINE HCL (PF) 4 % IJ SOLN
INTRAMUSCULAR | Status: AC
  Filled 2017-07-28: qty 5

## 2017-07-28 MED ORDER — POVIDONE-IODINE 5 % OP SOLN
OPHTHALMIC | Status: DC | PRN
  Administered 2017-07-28: 1 via OPHTHALMIC

## 2017-07-28 MED ORDER — NA CHONDROIT SULF-NA HYALURON 40-17 MG/ML IO SOLN
INTRAOCULAR | Status: AC
  Filled 2017-07-28: qty 1

## 2017-07-28 MED ORDER — SODIUM CHLORIDE 0.9 % IV SOLN
INTRAVENOUS | Status: DC
  Administered 2017-07-28: 11:00:00 via INTRAVENOUS

## 2017-07-28 MED ORDER — MOXIFLOXACIN HCL 0.5 % OP SOLN: 1.0000 [drp] | OPHTHALMIC | Status: DC | PRN

## 2017-07-28 SURGICAL SUPPLY — 22 items
CANNULA ANT/CHMB 27G (MISCELLANEOUS) IMPLANT
CANNULA ANT/CHMB 27GA (MISCELLANEOUS) ×9 IMPLANT
FILTER MILLEX .045 (MISCELLANEOUS) IMPLANT
FLTR MILLEX .045 (MISCELLANEOUS) ×3
GLOVE BIO SURGEON STRL SZ8 (GLOVE) ×3 IMPLANT
GLOVE BIOGEL M 6.5 STRL (GLOVE) ×3 IMPLANT
GLOVE SURG LX 8.0 MICRO (GLOVE) ×2
GLOVE SURG LX STRL 8.0 MICRO (GLOVE) ×1 IMPLANT
GOWN STRL REUS W/ TWL LRG LVL3 (GOWN DISPOSABLE) ×2 IMPLANT
GOWN STRL REUS W/TWL LRG LVL3 (GOWN DISPOSABLE) ×4
LABEL CATARACT MEDS ST (LABEL) ×3 IMPLANT
LENS IOL TECNIS ITEC 23.5 (Intraocular Lens) ×2 IMPLANT
PACK CATARACT (MISCELLANEOUS) ×3 IMPLANT
PACK CATARACT BRASINGTON LX (MISCELLANEOUS) ×3 IMPLANT
PACK EYE AFTER SURG (MISCELLANEOUS) ×3 IMPLANT
SOL BSS BAG (MISCELLANEOUS) ×3
SOLUTION BSS BAG (MISCELLANEOUS) ×1 IMPLANT
SUT ETHILON 10 0 CS140 6 (SUTURE) ×2 IMPLANT
SYR 3ML LL SCALE MARK (SYRINGE) ×2 IMPLANT
SYR 5ML LL (SYRINGE) ×3 IMPLANT
WATER STERILE IRR 250ML POUR (IV SOLUTION) ×3 IMPLANT
WIPE NON LINTING 3.25X3.25 (MISCELLANEOUS) ×3 IMPLANT

## 2017-07-28 NOTE — Op Note (Signed)
PREOPERATIVE DIAGNOSIS:  Nuclear sclerotic cataract of the left eye.   POSTOPERATIVE DIAGNOSIS:  Nuclear sclerotic cataract of the left eye.   OPERATIVE PROCEDURE: Procedure(s): CATARACT EXTRACTION PHACO AND INTRAOCULAR LENS PLACEMENT (IOC)   SURGEON:  Galen Manila, MD.   ANESTHESIA:  Anesthesiologist: Naomie Dean, MD CRNA: Rosario Jacks, CRNA  1.      Managed anesthesia care. 2.     0.29ml of Shugarcaine was instilled following the paracentesis   COMPLICATIONS: Vision Blue was used to stain the anterior capsule due to very poor/ no visualization of the red reflex.    TECHNIQUE:   Stop and chop   DESCRIPTION OF PROCEDURE:  The patient was examined and consented in the preoperative holding area where the aforementioned topical anesthesia was applied to the left eye and then brought back to the Operating Room where the left eye was prepped and draped in the usual sterile ophthalmic fashion and a lid speculum was placed. A paracentesis was created with the side port blade and the anterior chamber was filled with viscoelastic. A near clear corneal incision was performed with the steel keratome. A continuous curvilinear capsulorrhexis was performed with a cystotome followed by the capsulorrhexis forceps. Hydrodissection and hydrodelineation were carried out with BSS on a blunt cannula. The lens was removed in a stop and chop  technique and the remaining cortical material was removed with the irrigation-aspiration handpiece. The capsular bag was inflated with viscoelastic and the Technis ZCB00 lens was placed in the capsular bag without complication. The remaining viscoelastic was removed from the eye with the irrigation-aspiration handpiece. The wounds were hydrated. The anterior chamber was flushed with Miostat and the eye was inflated to physiologic pressure. 0.94ml Vigamox was placed in the anterior chamber. The wounds were found to be water tight. The eye was dressed with Vigamox.  The patient was given protective glasses to wear throughout the day and a shield with which to sleep tonight. The patient was also given drops with which to begin a drop regimen today and will follow-up with me in one day. Implant Name Type Inv. Item Serial No. Manufacturer Lot No. LRB No. Used  LENS IOL DIOP 23.5 - [id number] Intraocular Lens LENS IOL DIOP 23.5 [id number]  Left 1    Procedure(s) with comments: CATARACT EXTRACTION PHACO AND INTRAOCULAR LENS PLACEMENT (IOC) (Left) - [place] 00:43.8 AP% 16.3 CDE 7.14 Fluid Pack Lot # [id number]  Electronically signed: Galen Manila 07/28/2017 12:29 PM

## 2017-07-28 NOTE — Discharge Instructions (Signed)
Eye Surgery Discharge Instructions  Expect mild scratchy sensation or mild soreness. DO NOT RUB YOUR EYE!  The day of surgery:  Minimal physical activity, but bed rest is not required  No reading, computer work, or close hand work  No bending, lifting, or straining.  May watch TV  For 24 hours:  No driving, legal decisions, or alcoholic beverages  Safety precautions  Eat anything you prefer: It is better to start with liquids, then soup then solid foods.  _____ Eye patch should be worn until postoperative exam tomorrow.  ____ Solar shield eyeglasses should be worn for comfort in the sunlight/patch while sleeping  Resume all regular medications including aspirin or Coumadin if these were discontinued prior to surgery. You may shower, bathe, shave, or wash your hair. Tylenol may be taken for mild discomfort.  Call your doctor if you experience significant pain, nausea, or vomiting, fever > 101 or other signs of infection. [phone] or [phone] Specific instructions:  Follow-up Information    Galen Manila, MD Follow up.   Specialty:  Ophthalmology Why:  07/19/17 at 2:00 Contact information: [address] [place] [address] [phone]          Eye Surgery Discharge Instructions  Expect mild scratchy sensation or mild soreness. DO NOT RUB YOUR EYE!  The day of surgery:  Minimal physical activity, but bed rest is not required  No reading, computer work, or close hand work  No bending, lifting, or straining.  May watch TV  For 24 hours:  No driving, legal decisions, or alcoholic beverages  Safety precautions  Eat anything you prefer: It is better to start with liquids, then soup then solid foods.  _____ Eye patch should be worn until postoperative exam tomorrow.  ____ Solar shield eyeglasses should be worn for comfort in the sunlight/patch while sleeping  Resume all regular medications including aspirin or Coumadin if these were  discontinued prior to surgery. You may shower, bathe, shave, or wash your hair. Tylenol may be taken for mild discomfort.  Call your doctor if you experience significant pain, nausea, or vomiting, fever > 101 or other signs of infection. [phone] or [phone] Specific instructions:  Follow-up Information    Galen Manila, MD Follow up.   Specialty:  Ophthalmology Why:  07/19/17 at 2:00 Contact information: [address] [place] [address] [phone]

## 2017-07-28 NOTE — Transfer of Care (Signed)
Immediate Anesthesia Transfer of Care Note  Patient: [name]  Procedure(s) Performed: CATARACT EXTRACTION PHACO AND INTRAOCULAR LENS PLACEMENT (IOC) (Left Eye)  Patient Location: PACU  Anesthesia Type:MAC  Level of Consciousness: awake  Airway & Oxygen Therapy: Patient Spontanous Breathing  Post-op Assessment: Report given to RN  Post vital signs: stable  Last Vitals:  Vitals Value Taken Time  BP    Temp    Pulse    Resp    SpO2      Last Pain:  Vitals:   07/28/17 1124  TempSrc: Oral         Complications: No apparent anesthesia complications

## 2017-07-28 NOTE — Anesthesia Postprocedure Evaluation (Signed)
Anesthesia Post Note  Patient: [name]  Procedure(s) Performed: CATARACT EXTRACTION PHACO AND INTRAOCULAR LENS PLACEMENT (IOC) (Left Eye)  Patient location during evaluation: PACU Anesthesia Type: MAC Level of consciousness: awake Pain management: pain level controlled Vital Signs Assessment: post-procedure vital signs reviewed and stable Respiratory status: spontaneous breathing Cardiovascular status: blood pressure returned to baseline Postop Assessment: no apparent nausea or vomiting Anesthetic complications: no     Last Vitals:  Vitals:   07/28/17 1124  BP: 132/82  Pulse: 88  Resp: 20  Temp: 36.9 C  SpO2: 92%    Last Pain:  Vitals:   07/28/17 1124  TempSrc: Oral                 Carron Curie

## 2017-07-28 NOTE — Anesthesia Post-op Follow-up Note (Signed)
Anesthesia QCDR form completed.        

## 2017-07-28 NOTE — H&P (Signed)
All labs reviewed. Abnormal studies sent to patients PCP when indicated.  Previous H&P reviewed, patient examined, there are NO CHANGES.  [name] Porfilio5/14/201911:59 AM

## 2017-07-28 NOTE — Anesthesia Preprocedure Evaluation (Signed)
Anesthesia Evaluation  Patient identified by MRN, date of birth, ID band Patient awake    Reviewed: Allergy & Precautions, NPO status , Patient's Chart, lab work & pertinent test results  History of Anesthesia Complications Negative for: history of anesthetic complications  Airway Mallampati: III       Dental  (+) Upper Dentures, Lower Dentures   Pulmonary asthma , COPD,  COPD inhaler, neg recent URI, Current Smoker,           Cardiovascular hypertension, Pt. on medications + CAD and + Past MI  (-) CHF      Neuro/Psych Seizures - (last 4-5 months ago),  Anxiety Depression Bipolar Disorder CVA (L sided weakness), Residual Symptoms    GI/Hepatic Neg liver ROS, GERD  Medicated and Controlled,  Endo/Other  neg diabetes  Renal/GU negative Renal ROS     Musculoskeletal   Abdominal   Peds  Hematology  (+) anemia ,   Anesthesia Other Findings   Reproductive/Obstetrics                            Anesthesia Physical Anesthesia Plan  ASA: III  Anesthesia Plan: MAC   Post-op Pain Management:    Induction: Intravenous  PONV Risk Score and Plan:   Airway Management Planned: Nasal Cannula  Additional Equipment:   Intra-op Plan:   Post-operative Plan:   Informed Consent: I have reviewed the patients History and Physical, chart, labs and discussed the procedure including the risks, benefits and alternatives for the proposed anesthesia with the patient or authorized representative who has indicated his/her understanding and acceptance.     Plan Discussed with:   Anesthesia Plan Comments:         Anesthesia Quick Evaluation

## 2017-08-15 ENCOUNTER — Encounter: Payer: Self-pay | Admitting: Emergency Medicine

## 2017-08-15 ENCOUNTER — Emergency Department
Admission: EM | Admit: 2017-08-15 | Discharge: 2017-08-15 | Disposition: A | Discharge status: Home / Self Care | Payer: Medicaid Other | Attending: Emergency Medicine | Admitting: Emergency Medicine

## 2017-08-15 ENCOUNTER — Other Ambulatory Visit: Payer: Self-pay

## 2017-08-15 DIAGNOSIS — I10 Essential (primary) hypertension: Secondary | ICD-10-CM | POA: Diagnosis not present

## 2017-08-15 DIAGNOSIS — I2510 Atherosclerotic heart disease of native coronary artery without angina pectoris: Secondary | ICD-10-CM | POA: Diagnosis not present

## 2017-08-15 DIAGNOSIS — F17210 Nicotine dependence, cigarettes, uncomplicated: Secondary | ICD-10-CM | POA: Diagnosis not present

## 2017-08-15 DIAGNOSIS — J45909 Unspecified asthma, uncomplicated: Secondary | ICD-10-CM | POA: Diagnosis not present

## 2017-08-15 DIAGNOSIS — L299 Pruritus, unspecified: Secondary | ICD-10-CM | POA: Insufficient documentation

## 2017-08-15 DIAGNOSIS — I252 Old myocardial infarction: Secondary | ICD-10-CM | POA: Insufficient documentation

## 2017-08-15 DIAGNOSIS — Z7982 Long term (current) use of aspirin: Secondary | ICD-10-CM | POA: Insufficient documentation

## 2017-08-15 DIAGNOSIS — Z79899 Other long term (current) drug therapy: Secondary | ICD-10-CM | POA: Insufficient documentation

## 2017-08-15 MED ORDER — HYDROXYZINE HCL 10 MG PO TABS: 10.0000 mg | ORAL_TABLET | Freq: Three times a day (TID) | ORAL | 0 refills | Status: AC | PRN

## 2017-08-15 MED ORDER — HYDROXYZINE HCL 25 MG PO TABS
25.0000 mg | ORAL_TABLET | Freq: Once | ORAL | Status: AC
  Administered 2017-08-15: 25 mg via ORAL
  Filled 2017-08-15: qty 1

## 2017-08-15 NOTE — ED Triage Notes (Signed)
States he is anxious about spider bites on back. States has itching between shoulder blades. No insect bite noted. Does point a few other spots on legs he thinks are bites he has gotten in last few days.

## 2017-08-15 NOTE — ED Provider Notes (Signed)
[facility] Emergency Department Provider Note  ____________________________________________  Time seen: Approximately 8:20 PM  I have reviewed the triage vital signs and the nursing notes.   HISTORY  Chief Complaint Insect Bite    HPI [name] is a 52 y.o. male that presents emergency department for concerns of spider bite to bite back.  Patient states that he got his coffee this morning and then felt a spider bite his back.  He states that he saw the spider.  After bite incident, his back and arms have felt itchy.  He also states that his anxiety has increased recently. No specific trigger. No suicidal or homicidal ideations. He needs a primary care provider.    Past Medical History:  Diagnosis Date  . Anemia   . Anginal pain (HCC)   . Anxiety   . Arthritis   . Asthma   . Bipolar disorder (HCC)   . Chronic pain   . COPD (chronic obstructive pulmonary disease) (HCC)   . Coronary artery disease   . Depression   . Dizziness   . Dyspnea   . Dysrhythmia   . Edema   . Esophageal stricture   . GERD (gastroesophageal reflux disease)   . H/O tonic-clonic seizures   . Heart murmur   . Hypertension   . Myocardial infarction (HCC)    2017  . Orthopnea   . Oxygen deficit    1 /L HS  . Palpitations   . Polysubstance abuse (HCC)   . Seizures (HCC)   . Stroke [facility](HCC)    TIA 4/17  . Wheezing     Patient Active Problem List   Diagnosis Date Noted  . Arthritis 02/14/2016  . Asthma 02/14/2016  . Closed fracture of ankle 02/14/2016  . GERD (gastroesophageal reflux disease) 02/14/2016  . Ventral incisional hernia 02/14/2016  . Urinary urgency 12/25/2015  . Prostate cancer screening 12/25/2015  . NSTEMI (non-ST elevated myocardial infarction) (HCC) 12/17/2015  . Acute respiratory failure with hypoxia (HCC) 12/13/2015  . Bipolar affective disorder, current episode hypomanic (HCC)   . Bipolar disorder (HCC) 07/17/2015  . Suicidal ideation  07/17/2015  . Pneumonia 02/05/2015  . Chest pain 01/31/2015  . Left sided chest pain 01/19/2015  . Adjustment disorder with depressed mood 01/15/2015  . Dyslipidemia 01/03/2015  . Tobacco use disorder 01/03/2015  . Hypertension 01/02/2015  . Gastric reflux 01/02/2015    Past Surgical History:  Procedure Laterality Date  . APPENDECTOMY     complicated by perforation and abscess  . CATARACT EXTRACTION W/PHACO Right 06/30/2017   Procedure: CATARACT EXTRACTION PHACO AND INTRAOCULAR LENS PLACEMENT (IOC);  Surgeon: Galen ManilaPorfilio, William, MD;  Location: [facility] ORS;  Service: Ophthalmology;  Laterality: Right;  US 01:11 AP% 20.1 CDE 14.42 Fluid pack lot # [id number]  . CATARACT EXTRACTION W/PHACO Left 07/28/2017   Procedure: CATARACT EXTRACTION PHACO AND INTRAOCULAR LENS PLACEMENT (IOC);  Surgeon: Galen ManilaPorfilio, William, MD;  Location: [facility] ORS;  Service: Ophthalmology;  Laterality: Left;  US 00:43.8 AP% 16.3 CDE 7.14 Fluid Pack Lot # [id number]  . ESOPHAGOGASTRODUODENOSCOPY (EGD) WITH ESOPHAGEAL DILATION    . HERNIA REPAIR  Nov 2016   [facility] Dr. Genene ChurnBunzendahl  . PEG TUBE REMOVAL    . PEG W/TRACHEOSTOMY PLACEMENT    . TRACHEOSTOMY CLOSURE      Prior to Admission medications   Medication Sig Start Date End Date Taking? Authorizing Provider  albuterol (PROVENTIL HFA;VENTOLIN HFA) 108 (90 Base) MCG/ACT inhaler Inhale 2 puffs into  the lungs every 6 (six) hours as needed for wheezing or shortness of breath. 12/11/15   Jennye Moccasin, MD  albuterol (PROVENTIL) (2.5 MG/3ML) 0.083% nebulizer solution Take 3 mLs (2.5 mg total) by nebulization every 2 (two) hours as needed for wheezing. 12/14/15   Enedina Finner, MD  aspirin EC 81 MG tablet Start from oct 2nd Patient taking differently: Take 81 mg by mouth daily.  12/14/15   Enedina Finner, MD  atorvastatin (LIPITOR) 10 MG tablet Take 10 mg by mouth every evening.    [provider]  busPIRone (BUSPAR) 15 MG tablet Take 1 tablet (15 mg  total) by mouth 3 (three) times daily. 12/29/14   Loleta Rose, MD  Carbamide Peroxide (EAR DROPS OT) Place 1 drop in ear(s) 2 (two) times daily.    [provider]  clonazePAM (KLONOPIN) 0.5 MG tablet Take 1 tablet (0.5 mg total) by mouth 3 (three) times daily. 07/18/15   Clapacs, Jackquline Denmark, MD  diflunisal (DOLOBID) 500 MG TABS tablet Take 1 tablet (500 mg total) by mouth 2 (two) times daily as needed. Patient taking differently: Take 500 mg by mouth 2 (two) times daily as needed (for pain.).  12/03/15   Emily Filbert, MD  fenofibrate (TRICOR) 145 MG tablet Take 145 mg by mouth daily.    [provider]  FLUoxetine (PROZAC) 20 MG capsule Take 1 capsule (20 mg total) by mouth daily. Patient taking differently: Take 20 mg by mouth every evening.  07/18/15   Clapacs, Jackquline Denmark, MD  fluticasone (FLONASE) 50 MCG/ACT nasal spray Place 2 sprays into both nostrils 2 (two) times daily.     [provider]  hydrochlorothiazide (HYDRODIURIL) 25 MG tablet Take 25 mg by mouth daily.    [provider]  HYDROcodone-acetaminophen (NORCO/VICODIN) 5-325 MG tablet Take 1 tablet by mouth every 4 (four) hours as needed for moderate pain. 05/26/17   Jene Every, MD  hydrOXYzine (ATARAX/VISTARIL) 10 MG tablet Take 1 tablet (10 mg total) by mouth 3 (three) times daily as needed. 08/15/17   Enid Derry, PA-C  LATUDA 60 MG TABS Take 1 tablet by mouth every evening. 12/03/15   [provider]  loratadine (CLARITIN) 10 MG tablet Take 10 mg by mouth daily.    [provider]  metoprolol succinate (TOPROL-XL) 50 MG 24 hr tablet Take 50 mg by mouth daily.    [provider]  mirabegron ER ([name]) 50 MG TB24 tablet Take 1 tablet (50 mg total) by mouth daily. 05/29/16   Hildred Laser, MD  naproxen (NAPROSYN) 500 MG tablet Take 1 tablet (500 mg total) by mouth 2 (two) times daily with a meal. 05/26/17   Jene Every, MD  nicotine (NICODERM CQ - DOSED IN MG/24  HOURS) 14 mg/24hr patch Place 1 patch (14 mg total) onto the skin daily. 12/15/15   Enedina Finner, MD  oxyCODONE-acetaminophen (PERCOCET/ROXICET) 5-325 MG tablet Take 1 tablet by mouth every 6 (six) hours as needed for moderate pain. 12/18/15   Katha Hamming, MD  pantoprazole (PROTONIX) 40 MG tablet Take 1 tablet (40 mg total) by mouth daily. 12/29/14   Loleta Rose, MD  polyethylene glycol (MIRALAX / Ethelene Hal) packet Take 17 g by mouth daily. 12/03/15   Emily Filbert, MD  predniSONE (DELTASONE) 10 MG tablet Take 5 tablets (50 mg total) by mouth daily with breakfast. Take 50 mg daily taper by 10 mg daily then stop Patient not taking: Reported on 07/28/2017 12/15/15   Enedina Finner,  MD  QUEtiapine (SEROQUEL) 100 MG tablet Take 1 tablet (100 mg total) by mouth at bedtime. Patient taking differently: Take 200 mg by mouth at bedtime.  07/18/15   Clapacs, Jackquline Denmark, MD  tetrahydrozoline 0.05 % ophthalmic solution Place 1 drop into both eyes 3 (three) times daily as needed (for dry eyes).    [provider]  triamcinolone cream (KENALOG) 0.1 % Apply 1 application topically 2 (two) times daily. Patient not taking: Reported on 07/28/2017 12/23/15   Jennye Moccasin, MD    Allergies Patient has no known allergies.  Family History  Problem Relation Age of Onset  . CAD Other   . Diabetes Other   . Diabetes Mother   . Other Mother   . Other Father     Social History Social History   Tobacco Use  . Smoking status: Current Every Day Smoker    Packs/day: 1.00    Types: Cigarettes  . Smokeless tobacco: Never Used  Substance Use Topics  . Alcohol use: No  . Drug use: No    Comment: H/O POLYSUBSTANCE ABUSE     Review of Systems  Constitutional: No fever/chills Cardiovascular: No chest pain. Respiratory: No SOB. Gastrointestinal: No abdominal pain.   Musculoskeletal: Negative for musculoskeletal pain. Skin: Negative for  ecchymosis.   ____________________________________________   PHYSICAL EXAM:  VITAL SIGNS: ED Triage Vitals  Enc Vitals Group     BP 08/15/17 1855 120/84     Pulse Rate 08/15/17 1855 83     Resp 08/15/17 1855 20     Temp 08/15/17 1855 98.2 F (36.8 C)     Temp Source 08/15/17 1855 Oral     SpO2 08/15/17 1855 96 %     Weight 08/15/17 1857 167 lb (75.8 kg)     Height 08/15/17 1857 5\' 7"  (1.702 m)     Head Circumference --      Peak Flow --      Pain Score 08/15/17 1857 10     Pain Loc --      Pain Edu? --      Excl. in GC? --      Constitutional: Alert and oriented. Well appearing and in no acute distress. Eyes: Conjunctivae are normal. PERRL. EOMI. Head: Atraumatic. ENT:      Ears:      Nose: No congestion/rhinnorhea.      Mouth/Throat: Mucous membranes are moist.  Neck: No stridor. Cardiovascular: Normal rate, regular rhythm.  Good peripheral circulation. Respiratory: Normal respiratory effort without tachypnea or retractions. Lungs CTAB. Good air entry to the bases with no decreased or absent breath sounds. Musculoskeletal: Full range of motion to all extremities. No gross deformities appreciated. Neurologic:  Normal speech and language. No gross focal neurologic deficits are appreciated.  Skin:  Skin is warm, dry and intact. No rash noted. No visible bite mark.    ____________________________________________   LABS (all labs ordered are listed, but only abnormal results are displayed)  Labs Reviewed - No data to display ____________________________________________  EKG   ____________________________________________  RADIOLOGY   No results found.  ____________________________________________    PROCEDURES  Procedure(s) performed:    Procedures    Medications  hydrOXYzine (ATARAX/VISTARIL) tablet 25 mg (25 mg Oral Given 08/15/17 2038)     ____________________________________________   INITIAL IMPRESSION / ASSESSMENT AND PLAN / ED  COURSE  Pertinent labs & imaging results that were available during my care of the patient were reviewed by me and considered in my medical decision  making (see chart for details).  Review of the [place] CSRS was performed in accordance of the [facility] prior to dispensing any controlled drugs.     Patient presented to the emergency department for concern of spider bite. I do not see any spider bite. He also states his anxiety has been worsening. No suicidal or homicidal ideations.  He is requesting PCP referral.  Patient will be discharged home with prescriptions for Hydroxizine. Patient is to follow up with  PCP, psychiatry, counseling as directed. Patient is given ED precautions to return to the ED for any worsening or new symptoms.     ____________________________________________  FINAL CLINICAL IMPRESSION(S) / ED DIAGNOSES  Final diagnoses:  Itching      NEW MEDICATIONS STARTED DURING THIS VISIT:  ED Discharge Orders        Ordered    hydrOXYzine (ATARAX/VISTARIL) 10 MG tablet  3 times daily PRN     08/15/17 2039          This chart was dictated using voice recognition software/Dragon. Despite best efforts to proofread, errors can occur which can change the meaning. Any change was purely unintentional.    Enid Derry, PA-C 08/15/17 2203    Minna Antis, MD 08/15/17 2240

## 2017-08-15 NOTE — ED Notes (Signed)
Pt states he has bites over his body.

## 2017-11-03 ENCOUNTER — Other Ambulatory Visit: Payer: Self-pay | Admitting: Specialist

## 2017-11-03 DIAGNOSIS — J849 Interstitial pulmonary disease, unspecified: Secondary | ICD-10-CM

## 2017-11-26 ENCOUNTER — Ambulatory Visit
Admission: RE | Admit: 2017-11-26 | Discharge: 2017-11-26 | Disposition: A | Discharge status: Home / Self Care | Payer: Medicaid Other | Source: Ambulatory Visit | Attending: Specialist | Admitting: Specialist

## 2017-11-26 DIAGNOSIS — R918 Other nonspecific abnormal finding of lung field: Secondary | ICD-10-CM | POA: Diagnosis not present

## 2017-11-26 DIAGNOSIS — J432 Centrilobular emphysema: Secondary | ICD-10-CM | POA: Diagnosis not present

## 2017-11-26 DIAGNOSIS — J849 Interstitial pulmonary disease, unspecified: Secondary | ICD-10-CM | POA: Diagnosis present

## 2018-02-02 ENCOUNTER — Encounter: Payer: Self-pay | Admitting: *Deleted

## 2018-02-02 ENCOUNTER — Ambulatory Visit: Payer: Medicaid Other | Admitting: Gastroenterology

## 2018-02-09 ENCOUNTER — Telehealth: Payer: Self-pay | Admitting: Gastroenterology

## 2018-02-09 NOTE — Telephone Encounter (Signed)
Pt was a no show for appt on 02/02/18 which was a referral for GERD. Looks like his hernia surgery was in 2015 by Berneta SagesHartwig Bunzendahl. He should contact his PCP for this. Please let pt know when you call him to reschedule for his no show for 02/02/18. Thank you.

## 2018-02-09 NOTE — Telephone Encounter (Signed)
Pt says he has a knot in the same area as the incision for hernia procedure. Pls call pt to discuss

## 2018-06-10 ENCOUNTER — Telehealth: Payer: Medicaid Other | Admitting: Gastroenterology

## 2018-06-10 ENCOUNTER — Telehealth: Payer: Self-pay

## 2018-06-10 ENCOUNTER — Telehealth: Payer: Self-pay | Admitting: Gastroenterology

## 2018-06-10 ENCOUNTER — Ambulatory Visit (INDEPENDENT_AMBULATORY_CARE_PROVIDER_SITE_OTHER): Payer: Medicaid Other | Admitting: Gastroenterology

## 2018-06-10 DIAGNOSIS — Z5329 Procedure and treatment not carried out because of patient's decision for other reasons: Secondary | ICD-10-CM

## 2018-06-10 NOTE — Telephone Encounter (Signed)
Unable to contact patient for his telemed call with Dr. Maximino Greenland- "voicemail is not set up".  Several attempts made.  Thanks [employer]

## 2018-06-10 NOTE — Telephone Encounter (Signed)
Unable to get pt on the phone for Telemed pt uncle states pt does not live with him. Verified number with pt when scheduled apt 06/09/18

## 2018-06-10 NOTE — Progress Notes (Signed)
Tried reaching the pt at the number verified by clinic staff. It goes directly to voicemail with message that voicemail box is not set up and unable to leave voicemail. Clinic staff Dorita Sciara, called the pt contact (his uncle) who states pt does not live with him and he does not have another way of contacting the pt. Clinic staff, Uvaldo Bristle was asked to send a letter or mychart message to pt to call and reschedule appointment.

## 2018-07-13 ENCOUNTER — Encounter: Payer: Self-pay | Admitting: Gastroenterology

## 2018-07-13 ENCOUNTER — Ambulatory Visit (INDEPENDENT_AMBULATORY_CARE_PROVIDER_SITE_OTHER): Payer: Medicaid Other | Admitting: Gastroenterology

## 2018-07-13 DIAGNOSIS — Z5329 Procedure and treatment not carried out because of patient's decision for other reasons: Secondary | ICD-10-CM

## 2018-07-14 ENCOUNTER — Telehealth: Payer: Self-pay | Admitting: Gastroenterology

## 2018-07-14 NOTE — Telephone Encounter (Signed)
Unable to leave vm to r/s telemed with Dr. Maximino Greenland

## 2018-08-16 DEATH — deceased
